# Patient Record
Sex: Female | Born: 1990 | Hispanic: No | Marital: Single | Smoking: Never smoker
Health system: Southern US, Community
[De-identification: ages and names within clinical notes are randomized; demographics above are authoritative.]

## PROBLEM LIST (undated history)

## (undated) ENCOUNTER — Inpatient Hospital Stay: Payer: Self-pay

## (undated) DIAGNOSIS — I10 Essential (primary) hypertension: Secondary | ICD-10-CM

## (undated) DIAGNOSIS — F410 Panic disorder [episodic paroxysmal anxiety] without agoraphobia: Secondary | ICD-10-CM

## (undated) DIAGNOSIS — J45909 Unspecified asthma, uncomplicated: Secondary | ICD-10-CM

## (undated) DIAGNOSIS — F419 Anxiety disorder, unspecified: Secondary | ICD-10-CM

## (undated) HISTORY — PX: NO PAST SURGERIES: SHX2092

---
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
## 2008-01-19 ENCOUNTER — Observation Stay: Payer: Self-pay

## 2008-01-24 ENCOUNTER — Observation Stay: Payer: Self-pay | Admitting: Unknown Physician Specialty

## 2008-02-12 ENCOUNTER — Observation Stay: Payer: Self-pay | Admitting: Obstetrics & Gynecology

## 2008-03-03 ENCOUNTER — Observation Stay: Payer: Self-pay

## 2008-03-07 ENCOUNTER — Inpatient Hospital Stay: Payer: Self-pay

## 2009-05-27 ENCOUNTER — Emergency Department: Payer: Self-pay | Admitting: Emergency Medicine

## 2010-05-18 ENCOUNTER — Emergency Department: Payer: Self-pay | Admitting: Emergency Medicine

## 2010-10-15 LAB — URINALYSIS, ROUTINE W REFLEX MICROSCOPIC
Bilirubin Urine: NEGATIVE
Glucose, UA: NEGATIVE
Hgb urine dipstick: NEGATIVE
Ketones, ur: NEGATIVE
Nitrite: NEGATIVE
Protein, ur: NEGATIVE
Specific Gravity, Urine: 1.021
Urobilinogen, UA: 0.2
pH: 7

## 2010-10-15 LAB — WET PREP, GENITAL
Trich, Wet Prep: NONE SEEN
Yeast Wet Prep HPF POC: NONE SEEN

## 2010-10-15 LAB — COMPREHENSIVE METABOLIC PANEL
ALT: 13
Albumin: 2.5 — ABNORMAL LOW
BUN: 3 — ABNORMAL LOW
CO2: 25
Calcium: 7.5 — ABNORMAL LOW
Chloride: 106
Creatinine, Ser: 0.55
GFR calc Af Amer: >60
GFR calc non Af Amer: >60
Glucose, Bld: 126 — ABNORMAL HIGH
Potassium: 2.8 — ABNORMAL LOW
Total Bilirubin: 0.2 — ABNORMAL LOW
Total Protein: 5.5 — ABNORMAL LOW

## 2010-10-15 LAB — BASIC METABOLIC PANEL
BUN: 2 — ABNORMAL LOW
Calcium: 8.2 — ABNORMAL LOW
Creatinine, Ser: 0.58
GFR calc Af Amer: >60

## 2010-10-15 LAB — LACTATE DEHYDROGENASE: LDH: 93 — ABNORMAL LOW

## 2010-10-15 LAB — CBC
Hemoglobin: 10.3 — ABNORMAL LOW
MCHC: 34
MCV: 91.7
Platelets: 233
RBC: 3.29 — ABNORMAL LOW
RDW: 14.7
WBC: 15.9 — ABNORMAL HIGH

## 2010-10-15 LAB — URINE MICROSCOPIC-ADD ON

## 2010-10-15 LAB — CREATININE, SERUM
Creatinine, Ser: 0.49
GFR calc Af Amer: >60
GFR calc non Af Amer: >60

## 2010-10-15 LAB — URIC ACID: Uric Acid, Serum: 3.6

## 2011-07-06 ENCOUNTER — Emergency Department: Payer: Self-pay | Admitting: *Deleted

## 2011-07-06 LAB — COMPREHENSIVE METABOLIC PANEL
BUN: 12 mg/dL (ref 7–18)
Bilirubin,Total: 0.5 mg/dL (ref 0.2–1.0)
Calcium, Total: 9.4 mg/dL (ref 8.5–10.1)
Co2: 28 mmol/L (ref 21–32)
EGFR (Non-African Amer.): >60
Osmolality: 275 (ref 275–301)
SGOT(AST): 19 U/L (ref 15–37)
SGPT (ALT): 20 U/L
Sodium: 138 mmol/L (ref 136–145)

## 2011-07-06 LAB — CBC
HCT: 42.5 % (ref 35.0–47.0)
HGB: 13.9 g/dL (ref 12.0–16.0)
MCH: 29.1 pg (ref 26.0–34.0)
RDW: 13.6 % (ref 11.5–14.5)
WBC: 10.1 10*3/uL (ref 3.6–11.0)

## 2011-07-06 LAB — URINALYSIS, COMPLETE
Bilirubin,UR: NEGATIVE
Ketone: NEGATIVE
Nitrite: NEGATIVE
RBC,UR: 1 /HPF (ref 0–5)
Specific Gravity: 1.021 (ref 1.003–1.030)
Squamous Epithelial: 16
WBC UR: 6 /HPF (ref 0–5)

## 2011-10-29 ENCOUNTER — Emergency Department: Payer: Self-pay | Admitting: Emergency Medicine

## 2011-11-01 LAB — BETA STREP CULTURE(ARMC)

## 2011-12-26 ENCOUNTER — Emergency Department (HOSPITAL_COMMUNITY): Payer: No Typology Code available for payment source

## 2011-12-26 ENCOUNTER — Encounter (HOSPITAL_COMMUNITY): Payer: Self-pay | Admitting: *Deleted

## 2011-12-26 ENCOUNTER — Emergency Department (HOSPITAL_COMMUNITY)
Admission: EM | Admit: 2011-12-26 | Discharge: 2011-12-26 | Disposition: A | Discharge status: Home / Self Care | Payer: No Typology Code available for payment source | Attending: Emergency Medicine | Admitting: Emergency Medicine

## 2011-12-26 ENCOUNTER — Emergency Department: Payer: Self-pay | Admitting: Emergency Medicine

## 2011-12-26 DIAGNOSIS — S139XXA Sprain of joints and ligaments of unspecified parts of neck, initial encounter: Secondary | ICD-10-CM | POA: Insufficient documentation

## 2011-12-26 DIAGNOSIS — Y92410 Unspecified street and highway as the place of occurrence of the external cause: Secondary | ICD-10-CM | POA: Insufficient documentation

## 2011-12-26 DIAGNOSIS — S161XXA Strain of muscle, fascia and tendon at neck level, initial encounter: Secondary | ICD-10-CM

## 2011-12-26 DIAGNOSIS — S8000XA Contusion of unspecified knee, initial encounter: Secondary | ICD-10-CM | POA: Insufficient documentation

## 2011-12-26 DIAGNOSIS — S20219A Contusion of unspecified front wall of thorax, initial encounter: Secondary | ICD-10-CM | POA: Insufficient documentation

## 2011-12-26 DIAGNOSIS — S8002XA Contusion of left knee, initial encounter: Secondary | ICD-10-CM

## 2011-12-26 DIAGNOSIS — Y9389 Activity, other specified: Secondary | ICD-10-CM | POA: Insufficient documentation

## 2011-12-26 MED ORDER — IOHEXOL 300 MG/ML  SOLN
100.0000 mL | Freq: Once | INTRAMUSCULAR | Status: AC | PRN
  Administered 2011-12-26: 100 mL via INTRAVENOUS
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
## 2011-12-26 NOTE — Progress Notes (Signed)
Orthopedic Tech Progress Note Patient Details:  Julia Cherry 1990-01-18 [id number]  Ortho Devices Type of Ortho Device: Crutches;Knee Sleeve Ortho Device/Splint Location: left leg Ortho Device/Splint Interventions: Application   [name] 12/26/2011, 10:18 PM

## 2011-12-26 NOTE — ED Provider Notes (Signed)
Medical screening examination/treatment/procedure(s) were performed by non-physician practitioner and as supervising physician I was immediately available for consultation/collaboration.   Charles B. Sheldon, MD 12/26/11 2224 
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
## 2012-03-31 ENCOUNTER — Emergency Department: Payer: Self-pay | Admitting: Emergency Medicine

## 2012-03-31 LAB — URINALYSIS, COMPLETE
Bacteria: NONE SEEN
Bilirubin,UR: NEGATIVE
Blood: NEGATIVE
Glucose,UR: NEGATIVE mg/dL (ref 0–75)
Ketone: NEGATIVE
Nitrite: NEGATIVE
Specific Gravity: 1.014 (ref 1.003–1.030)
WBC UR: 1 /HPF (ref 0–5)

## 2012-03-31 LAB — COMPREHENSIVE METABOLIC PANEL
Albumin: 3.5 g/dL (ref 3.4–5.0)
Anion Gap: 6 — ABNORMAL LOW (ref 7–16)
BUN: 11 mg/dL (ref 7–18)
Bilirubin,Total: 0.2 mg/dL (ref 0.2–1.0)
Chloride: 105 mmol/L (ref 98–107)
Co2: 26 mmol/L (ref 21–32)
Potassium: 3.5 mmol/L (ref 3.5–5.1)
SGOT(AST): 20 U/L (ref 15–37)
Total Protein: 8.2 g/dL (ref 6.4–8.2)

## 2012-03-31 LAB — CBC
HCT: 41.6 % (ref 35.0–47.0)
MCHC: 33.3 g/dL (ref 32.0–36.0)
MCV: 87 fL (ref 80–100)
RDW: 13.3 % (ref 11.5–14.5)
WBC: 12.4 10*3/uL — ABNORMAL HIGH (ref 3.6–11.0)

## 2012-07-14 ENCOUNTER — Emergency Department: Payer: Self-pay | Admitting: Emergency Medicine

## 2012-08-11 ENCOUNTER — Emergency Department: Payer: Self-pay | Admitting: Emergency Medicine

## 2012-08-26 ENCOUNTER — Emergency Department: Payer: Self-pay | Admitting: Emergency Medicine

## 2012-09-22 ENCOUNTER — Emergency Department: Payer: Self-pay

## 2012-09-22 LAB — URINALYSIS, COMPLETE
Bacteria: NONE SEEN
Bilirubin,UR: NEGATIVE
Blood: NEGATIVE
Glucose,UR: NEGATIVE mg/dL (ref 0–75)
Ketone: NEGATIVE
Leukocyte Esterase: NEGATIVE
Nitrite: NEGATIVE
Ph: 8 (ref 4.5–8.0)
Protein: NEGATIVE

## 2012-10-08 ENCOUNTER — Emergency Department: Payer: Self-pay | Admitting: Emergency Medicine

## 2012-10-08 LAB — URINALYSIS, COMPLETE
Bacteria: NONE SEEN
Ketone: NEGATIVE
Leukocyte Esterase: NEGATIVE
Ph: 5 (ref 4.5–8.0)
Protein: NEGATIVE
RBC,UR: 2 /HPF (ref 0–5)
Squamous Epithelial: 9
WBC UR: 4 /HPF (ref 0–5)

## 2012-10-08 LAB — COMPREHENSIVE METABOLIC PANEL
Albumin: 3.5 g/dL (ref 3.4–5.0)
Anion Gap: 5 — ABNORMAL LOW (ref 7–16)
Bilirubin,Total: 0.4 mg/dL (ref 0.2–1.0)
Calcium, Total: 9.2 mg/dL (ref 8.5–10.1)
Creatinine: 0.84 mg/dL (ref 0.60–1.30)
EGFR (African American): >60
EGFR (Non-African Amer.): >60
Osmolality: 270 (ref 275–301)
Potassium: 3.6 mmol/L (ref 3.5–5.1)
Total Protein: 8.2 g/dL (ref 6.4–8.2)

## 2012-10-08 LAB — CBC
Platelet: 266 10*3/uL (ref 150–440)
RBC: 4.74 10*6/uL (ref 3.80–5.20)
WBC: 11.2 10*3/uL — ABNORMAL HIGH (ref 3.6–11.0)

## 2012-10-08 LAB — PREGNANCY, URINE: Pregnancy Test, Urine: NEGATIVE m[IU]/mL

## 2012-12-04 ENCOUNTER — Emergency Department: Payer: Self-pay | Admitting: Emergency Medicine

## 2013-01-31 ENCOUNTER — Emergency Department: Payer: Self-pay | Admitting: Emergency Medicine

## 2013-01-31 LAB — RAPID INFLUENZA A&B ANTIGENS (ARMC ONLY)

## 2013-05-04 ENCOUNTER — Emergency Department: Payer: Self-pay | Admitting: Emergency Medicine

## 2013-05-04 LAB — COMPREHENSIVE METABOLIC PANEL
ALK PHOS: 66 U/L
ALT: 18 U/L (ref 12–78)
AST: 21 U/L (ref 15–37)
Albumin: 3.2 g/dL — ABNORMAL LOW (ref 3.4–5.0)
Anion Gap: 3 — ABNORMAL LOW (ref 7–16)
BILIRUBIN TOTAL: 0.2 mg/dL (ref 0.2–1.0)
BUN: 11 mg/dL (ref 7–18)
Calcium, Total: 8.6 mg/dL (ref 8.5–10.1)
Chloride: 107 mmol/L (ref 98–107)
Co2: 30 mmol/L (ref 21–32)
Creatinine: 0.79 mg/dL (ref 0.60–1.30)
EGFR (African American): >60
Glucose: 117 mg/dL — ABNORMAL HIGH (ref 65–99)
OSMOLALITY: 280 (ref 275–301)
Potassium: 3.6 mmol/L (ref 3.5–5.1)
SODIUM: 140 mmol/L (ref 136–145)
TOTAL PROTEIN: 7.5 g/dL (ref 6.4–8.2)

## 2013-05-04 LAB — URINALYSIS, COMPLETE
Bacteria: NONE SEEN
Bilirubin,UR: NEGATIVE
GLUCOSE, UR: NEGATIVE mg/dL (ref 0–75)
Ketone: NEGATIVE
Leukocyte Esterase: NEGATIVE
NITRITE: NEGATIVE
PH: 5 (ref 4.5–8.0)
Protein: 30
RBC,UR: 197 /HPF (ref 0–5)
Specific Gravity: 1.024 (ref 1.003–1.030)
Squamous Epithelial: 2

## 2013-05-04 LAB — CBC WITH DIFFERENTIAL/PLATELET
BASOS PCT: 1.1 %
Basophil #: 0.1 10*3/uL (ref 0.0–0.1)
EOS ABS: 0.3 10*3/uL (ref 0.0–0.7)
Eosinophil %: 3.5 %
HCT: 38.5 % (ref 35.0–47.0)
HGB: 13.1 g/dL (ref 12.0–16.0)
Lymphocyte #: 2.1 10*3/uL (ref 1.0–3.6)
Lymphocyte %: 24.4 %
MCH: 29.7 pg (ref 26.0–34.0)
MCHC: 33.9 g/dL (ref 32.0–36.0)
MCV: 88 fL (ref 80–100)
MONOS PCT: 6.8 %
Monocyte #: 0.6 x10 3/mm (ref 0.2–0.9)
Neutrophil #: 5.6 10*3/uL (ref 1.4–6.5)
Neutrophil %: 64.2 %
Platelet: 253 10*3/uL (ref 150–440)
RBC: 4.4 10*6/uL (ref 3.80–5.20)
RDW: 14 % (ref 11.5–14.5)
WBC: 8.8 10*3/uL (ref 3.6–11.0)

## 2013-05-04 LAB — LIPASE, BLOOD: Lipase: 86 U/L (ref 73–393)

## 2013-08-22 ENCOUNTER — Ambulatory Visit: Payer: Self-pay | Admitting: Family Medicine

## 2013-08-22 LAB — URINALYSIS, COMPLETE
BACTERIA: NEGATIVE
BILIRUBIN, UR: NEGATIVE
BLOOD: NEGATIVE
Glucose,UR: NEGATIVE mg/dL (ref 0–75)
Ketone: NEGATIVE
Leukocyte Esterase: NEGATIVE
Nitrite: NEGATIVE
PROTEIN: NEGATIVE
Ph: 7 (ref 4.5–8.0)
RBC, UR: NONE SEEN /HPF (ref 0–5)
Specific Gravity: 1.015 (ref 1.003–1.030)

## 2014-04-21 ENCOUNTER — Emergency Department
Admit: 2014-04-21 | Disposition: A | Discharge status: Home / Self Care | Payer: Self-pay | Admitting: Emergency Medicine

## 2014-04-21 LAB — COMPREHENSIVE METABOLIC PANEL
ALBUMIN: 3.6 g/dL
AST: 17 U/L
Alkaline Phosphatase: 48 U/L
Anion Gap: 5 — ABNORMAL LOW (ref 7–16)
BUN: 14 mg/dL
Bilirubin,Total: 0.8 mg/dL
Calcium, Total: 8.9 mg/dL
Chloride: 106 mmol/L
Co2: 26 mmol/L
Creatinine: 0.71 mg/dL
Glucose: 102 mg/dL — ABNORMAL HIGH
Potassium: 3.4 mmol/L — ABNORMAL LOW
SGPT (ALT): 14 U/L
Sodium: 137 mmol/L
TOTAL PROTEIN: 7.1 g/dL

## 2014-04-21 LAB — CBC
HCT: 37.9 % (ref 35.0–47.0)
HGB: 12.6 g/dL (ref 12.0–16.0)
MCH: 29 pg (ref 26.0–34.0)
MCHC: 33.3 g/dL (ref 32.0–36.0)
MCV: 87 fL (ref 80–100)
PLATELETS: 225 10*3/uL (ref 150–440)
RBC: 4.36 10*6/uL (ref 3.80–5.20)
RDW: 13.4 % (ref 11.5–14.5)
WBC: 10.8 10*3/uL (ref 3.6–11.0)

## 2014-04-21 LAB — URINALYSIS, COMPLETE
BLOOD: NEGATIVE
Bacteria: NONE SEEN
Bilirubin,UR: NEGATIVE
Glucose,UR: NEGATIVE mg/dL (ref 0–75)
KETONE: NEGATIVE
Leukocyte Esterase: NEGATIVE
Nitrite: NEGATIVE
Ph: 8 (ref 4.5–8.0)
Protein: NEGATIVE
SPECIFIC GRAVITY: 1.018 (ref 1.003–1.030)

## 2014-04-21 LAB — LIPASE, BLOOD: Lipase: 46 U/L

## 2014-05-08 ENCOUNTER — Emergency Department
Admit: 2014-05-08 | Disposition: A | Discharge status: Home / Self Care | Payer: Self-pay | Admitting: Emergency Medicine

## 2014-10-24 ENCOUNTER — Encounter: Payer: Self-pay | Admitting: Emergency Medicine

## 2014-10-24 ENCOUNTER — Emergency Department: Payer: Medicaid Other

## 2014-10-24 ENCOUNTER — Emergency Department
Admission: EM | Admit: 2014-10-24 | Discharge: 2014-10-24 | Disposition: A | Discharge status: Home / Self Care | Payer: Medicaid Other | Attending: Emergency Medicine | Admitting: Emergency Medicine

## 2014-10-24 DIAGNOSIS — Z3A01 Less than 8 weeks gestation of pregnancy: Secondary | ICD-10-CM | POA: Insufficient documentation

## 2014-10-24 DIAGNOSIS — R51 Headache: Secondary | ICD-10-CM | POA: Insufficient documentation

## 2014-10-24 DIAGNOSIS — R109 Unspecified abdominal pain: Secondary | ICD-10-CM | POA: Insufficient documentation

## 2014-10-24 DIAGNOSIS — O9989 Other specified diseases and conditions complicating pregnancy, childbirth and the puerperium: Secondary | ICD-10-CM | POA: Diagnosis present

## 2014-10-24 DIAGNOSIS — Z3490 Encounter for supervision of normal pregnancy, unspecified, unspecified trimester: Secondary | ICD-10-CM

## 2014-10-24 HISTORY — DX: Unspecified asthma, uncomplicated: J45.909

## 2014-10-24 LAB — COMPREHENSIVE METABOLIC PANEL
ALK PHOS: 57 U/L (ref 38–126)
ALT: 15 U/L (ref 14–54)
AST: 16 U/L (ref 15–41)
Albumin: 3.6 g/dL (ref 3.5–5.0)
Anion gap: 8 (ref 5–15)
BUN: 15 mg/dL (ref 6–20)
CALCIUM: 9 mg/dL (ref 8.9–10.3)
CO2: 24 mmol/L (ref 22–32)
Chloride: 105 mmol/L (ref 101–111)
Creatinine, Ser: 0.71 mg/dL (ref 0.44–1.00)
GFR calc non Af Amer: >60 mL/min (ref >60)
Glucose, Bld: 110 mg/dL — ABNORMAL HIGH (ref 65–99)
Potassium: 3.6 mmol/L (ref 3.5–5.1)
Sodium: 137 mmol/L (ref 135–145)
TOTAL PROTEIN: 7.2 g/dL (ref 6.5–8.1)

## 2014-10-24 LAB — CBC WITH DIFFERENTIAL/PLATELET
Basophils Absolute: 0.1 10*3/uL (ref 0–0.1)
Basophils Relative: 1 %
Eosinophils Absolute: 0.5 10*3/uL (ref 0–0.7)
Eosinophils Relative: 4 %
HEMATOCRIT: 38.4 % (ref 35.0–47.0)
Hemoglobin: 12.8 g/dL (ref 12.0–16.0)
LYMPHS PCT: 22 %
Lymphs Abs: 2.4 10*3/uL (ref 1.0–3.6)
MCH: 28.7 pg (ref 26.0–34.0)
MCHC: 33.4 g/dL (ref 32.0–36.0)
MCV: 86 fL (ref 80.0–100.0)
MONOS PCT: 8 %
Monocytes Absolute: 0.9 10*3/uL (ref 0.2–0.9)
Neutro Abs: 7.1 10*3/uL — ABNORMAL HIGH (ref 1.4–6.5)
Neutrophils Relative %: 65 %
Platelets: 260 10*3/uL (ref 150–440)
RBC: 4.47 MIL/uL (ref 3.80–5.20)
RDW: 13.6 % (ref 11.5–14.5)
WBC: 10.9 10*3/uL (ref 3.6–11.0)

## 2014-10-24 LAB — URINALYSIS COMPLETE WITH MICROSCOPIC (ARMC ONLY)
Bilirubin Urine: NEGATIVE
Glucose, UA: NEGATIVE mg/dL
Hgb urine dipstick: NEGATIVE
KETONES UR: NEGATIVE mg/dL
Nitrite: NEGATIVE
Protein, ur: NEGATIVE mg/dL
Specific Gravity, Urine: 1.027 (ref 1.005–1.030)
pH: 6 (ref 5.0–8.0)

## 2014-10-24 LAB — HCG, QUANTITATIVE, PREGNANCY: hCG, Beta Chain, Quant, S: 4858 m[IU]/mL — ABNORMAL HIGH (ref <5)

## 2014-10-24 MED ORDER — ACETAMINOPHEN 500 MG PO TABS
1000.0000 mg | ORAL_TABLET | Freq: Once | ORAL | Status: AC
  Administered 2014-10-24: 1000 mg via ORAL

## 2014-10-24 MED ORDER — ACETAMINOPHEN 500 MG PO TABS
ORAL_TABLET | ORAL | Status: AC
  Administered 2014-10-24: 1000 mg via ORAL
  Filled 2014-10-24: qty 2

## 2014-10-24 NOTE — ED Notes (Signed)
C/o lower abd. Cramping and headache x 4-5 days, states she is [redacted] weeks pregnant, denies any vaginal bleeding

## 2014-10-24 NOTE — Discharge Instructions (Signed)
First Trimester of Pregnancy The first trimester of pregnancy is from week 1 until the end of week 12 (months 1 through 3). A week after a sperm fertilizes an egg, the egg will implant on the wall of the uterus. This embryo will begin to develop into a baby. Genes from you and your partner are forming the baby. The female genes determine whether the baby is a boy or a girl. At 6-8 weeks, the eyes and face are formed, and the heartbeat can be seen on ultrasound. At the end of 12 weeks, all the baby's organs are formed.  Now that you are pregnant, you will want to do everything you can to have a healthy baby. Two of the most important things are to get good prenatal care and to follow your health care provider's instructions. Prenatal care is all the medical care you receive before the baby's birth. This care will help prevent, find, and treat any problems during the pregnancy and childbirth. BODY CHANGES Your body goes through many changes during pregnancy. The changes vary from woman to woman.   You may gain or lose a couple of pounds at first.  You may feel sick to your stomach (nauseous) and throw up (vomit). If the vomiting is uncontrollable, call your health care provider.  You may tire easily.  You may develop headaches that can be relieved by medicines approved by your health care provider.  You may urinate more often. Painful urination may mean you have a bladder infection.  You may develop heartburn as a result of your pregnancy.  You may develop constipation because certain hormones are causing the muscles that push waste through your intestines to slow down.  You may develop hemorrhoids or swollen, bulging veins (varicose veins).  Your breasts may begin to grow larger and become tender. Your nipples may stick out more, and the tissue that surrounds them (areola) may become darker.  Your gums may bleed and may be sensitive to brushing and flossing.  Dark spots or blotches (chloasma,  mask of pregnancy) may develop on your face. This will likely fade after the baby is born.  Your menstrual periods will stop.  You may have a loss of appetite.  You may develop cravings for certain kinds of food.  You may have changes in your emotions from day to day, such as being excited to be pregnant or being concerned that something may go wrong with the pregnancy and baby.  You may have more vivid and strange dreams.  You may have changes in your hair. These can include thickening of your hair, rapid growth, and changes in texture. Some women also have hair loss during or after pregnancy, or hair that feels dry or thin. Your hair will most likely return to normal after your baby is born. WHAT TO EXPECT AT YOUR PRENATAL VISITS During a routine prenatal visit:  You will be weighed to make sure you and the baby are growing normally.  Your blood pressure will be taken.  Your abdomen will be measured to track your baby's growth.  The fetal heartbeat will be listened to starting around week 10 or 12 of your pregnancy.  Test results from any previous visits will be discussed. Your health care provider may ask you:  How you are feeling.  If you are feeling the baby move.  If you have had any abnormal symptoms, such as leaking fluid, bleeding, severe headaches, or abdominal cramping.  If you are using any tobacco products,   including cigarettes, chewing tobacco, and electronic cigarettes.  If you have any questions. Other tests that may be performed during your first trimester include:  Blood tests to find your blood type and to check for the presence of any previous infections. They will also be used to check for low iron levels (anemia) and Rh antibodies. Later in the pregnancy, blood tests for diabetes will be done along with other tests if problems develop.  Urine tests to check for infections, diabetes, or protein in the urine.  An ultrasound to confirm the proper growth  and development of the baby.  An amniocentesis to check for possible genetic problems.  Fetal screens for spina bifida and Down syndrome.  You may need other tests to make sure you and the baby are doing well.  HIV (human immunodeficiency virus) testing. Routine prenatal testing includes screening for HIV, unless you choose not to have this test. HOME CARE INSTRUCTIONS  Medicines  Follow your health care provider's instructions regarding medicine use. Specific medicines may be either safe or unsafe to take during pregnancy.  Take your prenatal vitamins as directed.  If you develop constipation, try taking a stool softener if your health care provider approves. Diet  Eat regular, well-balanced meals. Choose a variety of foods, such as meat or vegetable-based protein, fish, milk and low-fat dairy products, vegetables, fruits, and whole grain breads and cereals. Your health care provider will help you determine the amount of weight gain that is right for you.  Avoid raw meat and uncooked cheese. These carry germs that can cause birth defects in the baby.  Eating four or five small meals rather than three large meals a day may help relieve nausea and vomiting. If you start to feel nauseous, eating a few soda crackers can be helpful. Drinking liquids between meals instead of during meals also seems to help nausea and vomiting.  If you develop constipation, eat more high-fiber foods, such as fresh vegetables or fruit and whole grains. Drink enough fluids to keep your urine clear or pale yellow. Activity and Exercise  Exercise only as directed by your health care provider. Exercising will help you:  Control your weight.  Stay in shape.  Be prepared for labor and delivery.  Experiencing pain or cramping in the lower abdomen or low back is a good sign that you should stop exercising. Check with your health care provider before continuing normal exercises.  Try to avoid standing for long  periods of time. Move your legs often if you must stand in one place for a long time.  Avoid heavy lifting.  Wear low-heeled shoes, and practice good posture.  You may continue to have sex unless your health care provider directs you otherwise. Relief of Pain or Discomfort  Wear a good support bra for breast tenderness.   Take warm sitz baths to soothe any pain or discomfort caused by hemorrhoids. Use hemorrhoid cream if your health care provider approves.   Rest with your legs elevated if you have leg cramps or low back pain.  If you develop varicose veins in your legs, wear support hose. Elevate your feet for 15 minutes, 3-4 times a day. Limit salt in your diet. Prenatal Care  Schedule your prenatal visits by the twelfth week of pregnancy. They are usually scheduled monthly at first, then more often in the last 2 months before delivery.  Write down your questions. Take them to your prenatal visits.  Keep all your prenatal visits as directed by your   health care provider. Safety  Wear your seat belt at all times when driving.  Make a list of emergency phone numbers, including numbers for family, friends, the hospital, and police and fire departments. General Tips  Ask your health care provider for a referral to a local prenatal education class. Begin classes no later than at the beginning of month 6 of your pregnancy.  Ask for help if you have counseling or nutritional needs during pregnancy. Your health care provider can offer advice or refer you to specialists for help with various needs.  Do not use hot tubs, steam rooms, or saunas.  Do not douche or use tampons or scented sanitary pads.  Do not cross your legs for long periods of time.  Avoid cat litter boxes and soil used by cats. These carry germs that can cause birth defects in the baby and possibly loss of the fetus by miscarriage or stillbirth.  Avoid all smoking, herbs, alcohol, and medicines not prescribed by  your health care provider. Chemicals in these affect the formation and growth of the baby.  Do not use any tobacco products, including cigarettes, chewing tobacco, and electronic cigarettes. If you need help quitting, ask your health care provider. You may receive counseling support and other resources to help you quit.  Schedule a dentist appointment. At home, brush your teeth with a soft toothbrush and be gentle when you floss. SEEK MEDICAL CARE IF:   You have dizziness.  You have mild pelvic cramps, pelvic pressure, or nagging pain in the abdominal area.  You have persistent nausea, vomiting, or diarrhea.  You have a bad smelling vaginal discharge.  You have pain with urination.  You notice increased swelling in your face, hands, legs, or ankles. SEEK IMMEDIATE MEDICAL CARE IF:   You have a fever.  You are leaking fluid from your vagina.  You have spotting or bleeding from your vagina.  You have severe abdominal cramping or pain.  You have rapid weight gain or loss.  You vomit blood or material that looks like coffee grounds.  You are exposed to German measles and have never had them.  You are exposed to fifth disease or chickenpox.  You develop a severe headache.  You have shortness of breath.  You have any kind of trauma, such as from a fall or a car accident.   This information is not intended to replace advice given to you by your health care provider. Make sure you discuss any questions you have with your health care provider.   Document Released: 12/24/2000 Document Revised: 01/20/2014 Document Reviewed: 11/09/2012 [employer] Interactive Patient Education 2016 [employer]  

## 2014-10-24 NOTE — ED Provider Notes (Signed)
[facility] Emergency Department Provider Note  ____________________________________________  Time seen: 10 AM  I have reviewed the triage vital signs and the nursing notes.   HISTORY  Chief Complaint Abdominal Pain and Headache    HPI Julia Cherry is a 24 y.o. female who presents with lower abdominal mild cramping sensation. She reports she is about [redacted] weeks pregnant and reports this pain has become consistent over the last 3 days. She denies vaginal bleeding. No fevers no chills. No dysuria. No nausea no vomiting. She is never had this pain before. She was last pregnant 7 years ago     Past Medical History  Diagnosis Date  . Asthma     There are no active problems to display for this patient.   History reviewed. No pertinent past surgical history.  Current Outpatient Rx  Name  Route  Sig  Dispense  Refill  . albuterol (PROVENTIL HFA;VENTOLIN HFA) 108 (90 BASE) MCG/ACT inhaler   Inhalation   Inhale 2 puffs into the lungs every 6 (six) hours as needed for wheezing or shortness of breath.         . benzonatate (TESSALON) 100 MG capsule   Oral   Take 100 mg by mouth 3 (three) times daily as needed for cough.          . budesonide-formoterol (SYMBICORT) 160-4.5 MCG/ACT inhaler   Inhalation   Inhale 2 puffs into the lungs 2 (two) times daily as needed (shortness of breath, wheezing).           Allergies Review of patient's allergies indicates no known allergies.  No family history on file.  Social History Social History  Substance Use Topics  . Smoking status: Never Smoker   . Smokeless tobacco: None  . Alcohol Use: No    Review of Systems  Constitutional: Negative for fever. Eyes: Negative for visual changes. ENT: Negative for sore throat Cardiovascular: Negative for chest pain. Respiratory: Negative for shortness of breath. Gastrointestinal: Crampy abdominal pain Genitourinary: Negative for  dysuria. Musculoskeletal: Negative for back pain. Skin: Negative for rash. Neurological: Negative for headaches or focal weakness Psychiatric: No anxiety    ____________________________________________   PHYSICAL EXAM:  VITAL SIGNS: ED Triage Vitals  Enc Vitals Group     BP 10/24/14 0838 124/76 mmHg     Pulse Rate 10/24/14 0838 86     Resp 10/24/14 0838 18     Temp 10/24/14 0838 98.4 F (36.9 C)     Temp Source 10/24/14 0838 Oral     SpO2 10/24/14 0838 98 %     Weight 10/24/14 0838 160 lb (72.576 kg)     Height 10/24/14 0838  (1.575 m)     Head Cir --      Peak Flow --      Pain Score 10/24/14 0839 8     Pain Loc --      Pain Edu? --      Excl. in GC? --      Constitutional: Alert and oriented. Well appearing and in no distress. Eyes: Conjunctivae are normal.  ENT   Head: Normocephalic and atraumatic.   Mouth/Throat: Mucous membranes are moist. Cardiovascular: Normal rate, regular rhythm. Normal and symmetric distal pulses are present in all extremities. No murmurs, rubs, or gallops. Respiratory: Normal respiratory effort without tachypnea nor retractions. Breath sounds are clear and equal bilaterally.  Gastrointestinal: Soft and non-tender in all quadrants. No distention. There is no CVA tenderness. Genitourinary: deferred Musculoskeletal: Nontender  with normal range of motion in all extremities. No lower extremity tenderness nor edema. Neurologic:  Normal speech and language. No gross focal neurologic deficits are appreciated. Skin:  Skin is warm, dry and intact. No rash noted. Psychiatric: Mood and affect are normal. Patient exhibits appropriate insight and judgment.  ____________________________________________    LABS (pertinent positives/negatives)  Labs Reviewed  CBC WITH DIFFERENTIAL/PLATELET - Abnormal; Notable for the following:    Neutro Abs 7.1 (*)    All other components within normal limits  COMPREHENSIVE METABOLIC PANEL - Abnormal;  Notable for the following:    Glucose, Bld 110 (*)    Total Bilirubin <0.1 (*)    All other components within normal limits  HCG, QUANTITATIVE, PREGNANCY - Abnormal; Notable for the following:    hCG, Beta Chain, Quant, S 4858 (*)    All other components within normal limits  URINALYSIS COMPLETEWITH MICROSCOPIC ([facility] ONLY) - Abnormal; Notable for the following:    Color, Urine YELLOW (*)    APPearance CLOUDY (*)    Leukocytes, UA 1+ (*)    Bacteria, UA RARE (*)    Squamous Epithelial / LPF TOO NUMEROUS TO COUNT (*)    All other components within normal limits    ____________________________________________   EKG  None  ____________________________________________    RADIOLOGY I have personally reviewed any xrays that were ordered on this patient: Ultrasound shows gestational sac  ____________________________________________   PROCEDURES  Procedure(s) performed: none  Critical Care performed: none  ____________________________________________   INITIAL IMPRESSION / ASSESSMENT AND PLAN / ED COURSE  Pertinent labs & imaging results that were available during my care of the patient were reviewed by me and considered in my medical decision making (see chart for details).  Patient overall well-appearing. Benign exam. We'll obtain ultrasound given that she is [redacted] weeks pregnant to verify IUP. Lab work is benign. Urine is likely contaminated  Ultrasound shows gestational sac I discussed with the patient the need to have a repeat ultrasound in 2 weeks. She will follow-up with GYN. She reports her abdominal pain is much better.  ____________________________________________   FINAL CLINICAL IMPRESSION(S) / ED DIAGNOSES  Final diagnoses:  Early stage of pregnancy     Jene Every, MD 10/24/14 1215

## 2014-11-22 ENCOUNTER — Emergency Department: Payer: Medicaid Other

## 2014-11-22 ENCOUNTER — Emergency Department
Admission: EM | Admit: 2014-11-22 | Discharge: 2014-11-23 | Disposition: A | Discharge status: Home / Self Care | Payer: Medicaid Other | Attending: Emergency Medicine | Admitting: Emergency Medicine

## 2014-11-22 DIAGNOSIS — Z3A09 9 weeks gestation of pregnancy: Secondary | ICD-10-CM | POA: Insufficient documentation

## 2014-11-22 DIAGNOSIS — O9989 Other specified diseases and conditions complicating pregnancy, childbirth and the puerperium: Secondary | ICD-10-CM | POA: Diagnosis present

## 2014-11-22 DIAGNOSIS — R109 Unspecified abdominal pain: Secondary | ICD-10-CM | POA: Insufficient documentation

## 2014-11-22 DIAGNOSIS — R102 Pelvic and perineal pain: Secondary | ICD-10-CM

## 2014-11-22 DIAGNOSIS — Z3491 Encounter for supervision of normal pregnancy, unspecified, first trimester: Secondary | ICD-10-CM

## 2014-11-22 LAB — CBC WITH DIFFERENTIAL/PLATELET
Basophils Absolute: 0.1 10*3/uL (ref 0–0.1)
Basophils Relative: 0 %
Eosinophils Absolute: 0.3 10*3/uL (ref 0–0.7)
Eosinophils Relative: 2 %
HEMATOCRIT: 35.9 % (ref 35.0–47.0)
Hemoglobin: 12.3 g/dL (ref 12.0–16.0)
LYMPHS PCT: 20 %
Lymphs Abs: 2.9 10*3/uL (ref 1.0–3.6)
MCH: 29.3 pg (ref 26.0–34.0)
MCHC: 34.3 g/dL (ref 32.0–36.0)
MCV: 85.4 fL (ref 80.0–100.0)
MONO ABS: 1.1 10*3/uL — AB (ref 0.2–0.9)
MONOS PCT: 8 %
Neutro Abs: 10.2 10*3/uL — ABNORMAL HIGH (ref 1.4–6.5)
Neutrophils Relative %: 70 %
PLATELETS: 269 10*3/uL (ref 150–440)
RBC: 4.2 MIL/uL (ref 3.80–5.20)
RDW: 13.5 % (ref 11.5–14.5)
WBC: 14.6 10*3/uL — ABNORMAL HIGH (ref 3.6–11.0)

## 2014-11-22 LAB — URINALYSIS COMPLETE WITH MICROSCOPIC (ARMC ONLY)
BILIRUBIN URINE: NEGATIVE
Bacteria, UA: NONE SEEN
Glucose, UA: NEGATIVE mg/dL
HGB URINE DIPSTICK: NEGATIVE
Ketones, ur: NEGATIVE mg/dL
Leukocytes, UA: NEGATIVE
Nitrite: NEGATIVE
PH: 5 (ref 5.0–8.0)
Protein, ur: NEGATIVE mg/dL
SPECIFIC GRAVITY, URINE: 1.028 (ref 1.005–1.030)

## 2014-11-22 LAB — TYPE AND SCREEN
ABO/RH(D): O POS
Antibody Screen: NEGATIVE

## 2014-11-22 LAB — COMPREHENSIVE METABOLIC PANEL
ALK PHOS: 47 U/L (ref 38–126)
ALT: 14 U/L (ref 14–54)
AST: 18 U/L (ref 15–41)
Albumin: 3.7 g/dL (ref 3.5–5.0)
Anion gap: 6 (ref 5–15)
BILIRUBIN TOTAL: 0.1 mg/dL — AB (ref 0.3–1.2)
BUN: 12 mg/dL (ref 6–20)
CALCIUM: 9.8 mg/dL (ref 8.9–10.3)
CHLORIDE: 106 mmol/L (ref 101–111)
CO2: 24 mmol/L (ref 22–32)
CREATININE: 0.64 mg/dL (ref 0.44–1.00)
Glucose, Bld: 88 mg/dL (ref 65–99)
Potassium: 3.4 mmol/L — ABNORMAL LOW (ref 3.5–5.1)
Sodium: 136 mmol/L (ref 135–145)
Total Protein: 7.6 g/dL (ref 6.5–8.1)

## 2014-11-22 LAB — LIPASE, BLOOD: Lipase: 22 U/L (ref 11–51)

## 2014-11-22 LAB — HCG, QUANTITATIVE, PREGNANCY: hCG, Beta Chain, Quant, S: 143120 m[IU]/mL — ABNORMAL HIGH (ref <5)

## 2014-11-22 NOTE — ED Notes (Signed)
PT IN WITH LOWER ABD CRAMPING AND HEADACHE.  STATES IS [redacted] WEEKS PREGNANT, DENIES ANY VAGINAL BLEEDING.

## 2014-11-22 NOTE — ED Provider Notes (Signed)
[facility] Emergency Department Provider Note  ____________________________________________  Time seen: Approximately 10:34 PM  I have reviewed the triage vital signs and the nursing notes.   HISTORY  Chief Complaint Abdominal Pain    HPI Julia Cherry is a 24 y.o. female who complains of crampy abdominal pain. Patient reports it started lower and now has worked its way up to the whole belly is from one side to the other. No fever vomiting or diarrhea. She had a normal bowel movement today. She usually has bowel movements once a day and she has been regular with her stools. Has seen Dr. Dionne Bucy side for her pregnancy she is about 8 weeks now. Patient has also seen Dr. Jean Rosenthal for headache she's been having taking Tylenol for that and another medicine which she can only take really pregnancy which makes her sleepy. She does not remember the name of it. Severity is mild to moderate. Nothing really seems to make it better or worse.   Past Medical History  Diagnosis Date  . Asthma     There are no active problems to display for this patient.   No past surgical history on file.  Current Outpatient Rx  Name  Route  Sig  Dispense  Refill  . albuterol (PROVENTIL HFA;VENTOLIN HFA) 108 (90 BASE) MCG/ACT inhaler   Inhalation   Inhale 2 puffs into the lungs every 6 (six) hours as needed for wheezing or shortness of breath.         . benzonatate (TESSALON) 100 MG capsule   Oral   Take 100 mg by mouth 3 (three) times daily as needed for cough.          . budesonide-formoterol (SYMBICORT) 160-4.5 MCG/ACT inhaler   Inhalation   Inhale 2 puffs into the lungs 2 (two) times daily as needed (shortness of breath, wheezing).           Allergies Review of patient's allergies indicates no known allergies.  No family history on file.  Social History Social History  Substance Use Topics  . Smoking status: Never Smoker   . Smokeless tobacco: Not  on file  . Alcohol Use: No    Review of Systems Constitutional: No fever/chills Eyes: No visual changes. ENT: No sore throat. Cardiovascular: Denies chest pain. Respiratory: Denies shortness of breath. Gastrointestinal:   No nausea, no vomiting.  No diarrhea.  No constipation. Genitourinary: Negative for dysuria. Musculoskeletal: Negative for back pain. Skin: Negative for rash. Neurological: Negative for focal weakness or numbness.  10-point ROS otherwise negative.  ____________________________________________   PHYSICAL EXAM:  VITAL SIGNS: ED Triage Vitals  Enc Vitals Group     BP 11/22/14 2023 134/70 mmHg     Pulse Rate 11/22/14 2023 91     Resp 11/22/14 2023 18     Temp 11/22/14 2023 98.6 F (37 C)     Temp Source 11/22/14 2023 Oral     SpO2 11/22/14 2023 97 %     Weight 11/22/14 2023 166 lb (75.297 kg)     Height 11/22/14 2023  (1.575 m)     Head Cir --      Peak Flow --      Pain Score 11/22/14 2023 9     Pain Loc --      Pain Edu? --      Excl. in GC? --     Constitutional: Alert and oriented. Well appearing and in no acute distress. Eyes: Conjunctivae are normal. PERRL.  EOMI. Head: Atraumatic. Nose: No congestion/rhinnorhea. Mouth/Throat: Mucous membranes are moist.  Oropharynx non-erythematous. Neck: No stridor.  Cardiovascular: Normal rate, regular rhythm. Grossly normal heart sounds.  Good peripheral circulation. Respiratory: Normal respiratory effort.  No retractions. Lungs CTAB. Gastrointestinal: Soft mildly tender to palpation throughout. There is no rebound tenderness or tenderness to percussion. Bowel sounds sound normal.. No distention. No abdominal bruits. No CVA tenderness. Musculoskeletal: No lower extremity tenderness nor edema.  No joint effusions. Neurologic:  Normal speech and language. No gross focal neurologic deficits are appreciated. No gait instability. Skin:  Skin is warm, dry and intact. No rash  noted.   ____________________________________________   LABS (all labs ordered are listed, but only abnormal results are displayed)  Labs Reviewed  CBC WITH DIFFERENTIAL/PLATELET - Abnormal; Notable for the following:    WBC 14.6 (*)    Neutro Abs 10.2 (*)    Monocytes Absolute 1.1 (*)    All other components within normal limits  COMPREHENSIVE METABOLIC PANEL - Abnormal; Notable for the following:    Potassium 3.4 (*)    Total Bilirubin 0.1 (*)    All other components within normal limits  URINALYSIS COMPLETEWITH MICROSCOPIC ([facility] ONLY) - Abnormal; Notable for the following:    Color, Urine YELLOW (*)    APPearance HAZY (*)    Squamous Epithelial / LPF 0-5 (*)    All other components within normal limits  HCG, QUANTITATIVE, PREGNANCY - Abnormal; Notable for the following:    hCG, Beta Chain, Quant, S 143120 (*)    All other components within normal limits  LIPASE, BLOOD  TYPE AND SCREEN  ABO/RH   ____________________________________________  EKG   ____________________________________________  RADIOLOGY   ____________________________________________   PROCEDURES    ____________________________________________   INITIAL IMPRESSION / ASSESSMENT AND PLAN / ED COURSE  Pertinent labs & imaging results that were available during my care of the patient were reviewed by me and considered in my medical decision making (see chart for details). Dr. Manson PasseyBrown will assume care of this patient  ____________________________________________   FINAL CLINICAL IMPRESSION(S) / ED DIAGNOSES  Final diagnoses:  Abdominal pain, unspecified abdominal location      Arnaldo NatalPaul F Marjan Rosman, MD 11/23/14 [id number]

## 2014-11-22 NOTE — ED Notes (Signed)
Dr. Malinda at bedside.  

## 2014-11-22 NOTE — ED Notes (Addendum)
Pt presents to ED with c/o lower abdominal pain and frontal headache since 1 pm today. Pt has hx of migraines, states she is 8 weeks and 6 days pregnant. Pt denies chest pain, shortness of breath, constipation, urinary problems, vomiting, diarrhea or fever at home. Pt tearful during assessment. Pt alert and oriented x 4, respirations even and unlabored, skin warm and dry, family at bedside. Call bed within reach, will continue to monitor.

## 2014-11-22 NOTE — ED Notes (Signed)
Informed lab of add-on of Lipase for patinet. Lab acknowledged understanding.

## 2014-11-23 LAB — ABO/RH: ABO/RH(D): O POS

## 2014-11-23 NOTE — ED Provider Notes (Signed)
I seem care of the patient 11:00 PM from Dr.Malinda. Pelvic ultrasound and general abdominal ultrasound revealed  US Abdomen Complete (Final result) Result time: 11/23/14 01:47:43   Final result by Rad Results In Interface (11/23/14 01:47:43)   Narrative:   CLINICAL DATA: Crampy abdominal pain. Elevated white count.  EXAM: ULTRASOUND ABDOMEN COMPLETE  COMPARISON: 04/01/2012 abdominal CT  FINDINGS: Delay between scan time and reporting due to PACS IT factors.  Gallbladder: No gallstones or wall thickening visualized. No sonographic Murphy sign noted.  Common bile duct: Diameter: 3 mm  Liver: No focal lesion identified. Within normal limits in parenchymal echogenicity. Antegrade flow in the imaged portal venous system.  IVC: No abnormality visualized.  Pancreas: Visualized portion unremarkable.  Spleen: Size and appearance within normal limits.  Right Kidney: Length: 10 cm. Echogenicity within normal limits. No mass or hydronephrosis visualized.  Left Kidney: Length: 11 cm. Echogenicity within normal limits. No mass or hydronephrosis visualized.  Abdominal aorta: No aneurysm visualized.  Other findings: Asymmetric hypoechoic appearance at the left basilar chest.  IMPRESSION: 1. Normal abdominal imaging. 2. Possible left pleural effusion.   Electronically Signed By: Marnee SpringJonathon Watts M.D. On: 11/23/2014 01:47          US OB Comp Less 14 Wks (Final result) Result time: 11/22/14 23:28:42   Final result by Rad Results In Interface (11/22/14 23:28:42)   Narrative:   CLINICAL DATA: Cramping for 1 day in first-trimester pregnancy  EXAM: OBSTETRIC <14 WK US AND TRANSVAGINAL OB US  TECHNIQUE: Both transabdominal and transvaginal ultrasound examinations were performed for complete evaluation of the gestation as well as the maternal uterus, adnexal regions, and pelvic cul-de-sac. Transvaginal technique was performed to assess early  pregnancy.  COMPARISON: 10/24/2014  FINDINGS: Intrauterine gestational sac: Visualized/normal in shape. No subchorionic fluid.  Yolk sac: Present  Embryo: Present  Cardiac Activity: Present  Heart Rate: 168 bpm  CRL: 25.4 mm  9 w  2 d         US EDC: 06/25/2015  Maternal uterus/adnexae: Normal appearance of the left ovary. Right ovary not visualized. No adnexal mass. Trace simple pelvic fluid.  IMPRESSION: 1. Single living intrauterine gestation measuring 9 weeks 2 days. No adverse findings. 2. Trace pelvic fluid.   Electronically Signed By: Marnee SpringJonathon Watts M.D. On: 11/22/2014 23:28          US OB Transvaginal (Final result) Result time: 11/22/14 23:28:42   Final result by Rad Results In Interface (11/22/14 23:28:42)   Narrative:   CLINICAL DATA: Cramping for 1 day in first-trimester pregnancy  EXAM: OBSTETRIC <14 WK US AND TRANSVAGINAL OB US  TECHNIQUE: Both transabdominal and transvaginal ultrasound examinations were performed for complete evaluation of the gestation as well as the maternal uterus, adnexal regions, and pelvic cul-de-sac. Transvaginal technique was performed to assess early pregnancy.  COMPARISON: 10/24/2014  FINDINGS: Intrauterine gestational sac: Visualized/normal in shape. No subchorionic fluid.  Yolk sac: Present  Embryo: Present  Cardiac Activity: Present  Heart Rate: 168 bpm  CRL: 25.4 mm  9 w  2 d         US EDC: 06/25/2015  Maternal uterus/adnexae: Normal appearance of the left ovary. Right ovary not visualized. No adnexal mass. Trace simple pelvic fluid.  IMPRESSION: 1. Single living intrauterine gestation measuring 9 weeks 2 days. No adverse findings. 2. Trace pelvic fluid.   Electronically Signed By: Marnee SpringJonathon Watts M.D. On: 11/22/2014 23:28      Patient notified of all clinical findings.  Darci Currentandolph N Brown, MD 11/23/14  0154 

## 2014-11-23 NOTE — Discharge Instructions (Signed)
First Trimester of Pregnancy The first trimester of pregnancy is from week 1 until the end of week 12 (months 1 through 3). A week after a sperm fertilizes an egg, the egg will implant on the wall of the uterus. This embryo will begin to develop into a baby. Genes from you and your partner are forming the baby. The female genes determine whether the baby is a boy or a girl. At 6-8 weeks, the eyes and face are formed, and the heartbeat can be seen on ultrasound. At the end of 12 weeks, all the baby's organs are formed.  Now that you are pregnant, you will want to do everything you can to have a healthy baby. Two of the most important things are to get good prenatal care and to follow your health care provider's instructions. Prenatal care is all the medical care you receive before the baby's birth. This care will help prevent, find, and treat any problems during the pregnancy and childbirth. BODY CHANGES Your body goes through many changes during pregnancy. The changes vary from woman to woman.   You may gain or lose a couple of pounds at first.  You may feel sick to your stomach (nauseous) and throw up (vomit). If the vomiting is uncontrollable, call your health care provider.  You may tire easily.  You may develop headaches that can be relieved by medicines approved by your health care provider.  You may urinate more often. Painful urination may mean you have a bladder infection.  You may develop heartburn as a result of your pregnancy.  You may develop constipation because certain hormones are causing the muscles that push waste through your intestines to slow down.  You may develop hemorrhoids or swollen, bulging veins (varicose veins).  Your breasts may begin to grow larger and become tender. Your nipples may stick out more, and the tissue that surrounds them (areola) may become darker.  Your gums may bleed and may be sensitive to brushing and flossing.  Dark spots or blotches (chloasma,  mask of pregnancy) may develop on your face. This will likely fade after the baby is born.  Your menstrual periods will stop.  You may have a loss of appetite.  You may develop cravings for certain kinds of food.  You may have changes in your emotions from day to day, such as being excited to be pregnant or being concerned that something may go wrong with the pregnancy and baby.  You may have more vivid and strange dreams.  You may have changes in your hair. These can include thickening of your hair, rapid growth, and changes in texture. Some women also have hair loss during or after pregnancy, or hair that feels dry or thin. Your hair will most likely return to normal after your baby is born. WHAT TO EXPECT AT YOUR PRENATAL VISITS During a routine prenatal visit:  You will be weighed to make sure you and the baby are growing normally.  Your blood pressure will be taken.  Your abdomen will be measured to track your baby's growth.  The fetal heartbeat will be listened to starting around week 10 or 12 of your pregnancy.  Test results from any previous visits will be discussed. Your health care provider may ask you:  How you are feeling.  If you are feeling the baby move.  If you have had any abnormal symptoms, such as leaking fluid, bleeding, severe headaches, or abdominal cramping.  If you are using any tobacco products,   including cigarettes, chewing tobacco, and electronic cigarettes.  If you have any questions. Other tests that may be performed during your first trimester include:  Blood tests to find your blood type and to check for the presence of any previous infections. They will also be used to check for low iron levels (anemia) and Rh antibodies. Later in the pregnancy, blood tests for diabetes will be done along with other tests if problems develop.  Urine tests to check for infections, diabetes, or protein in the urine.  An ultrasound to confirm the proper growth  and development of the baby.  An amniocentesis to check for possible genetic problems.  Fetal screens for spina bifida and Down syndrome.  You may need other tests to make sure you and the baby are doing well.  HIV (human immunodeficiency virus) testing. Routine prenatal testing includes screening for HIV, unless you choose not to have this test. HOME CARE INSTRUCTIONS  Medicines  Follow your health care provider's instructions regarding medicine use. Specific medicines may be either safe or unsafe to take during pregnancy.  Take your prenatal vitamins as directed.  If you develop constipation, try taking a stool softener if your health care provider approves. Diet  Eat regular, well-balanced meals. Choose a variety of foods, such as meat or vegetable-based protein, fish, milk and low-fat dairy products, vegetables, fruits, and whole grain breads and cereals. Your health care provider will help you determine the amount of weight gain that is right for you.  Avoid raw meat and uncooked cheese. These carry germs that can cause birth defects in the baby.  Eating four or five small meals rather than three large meals a day may help relieve nausea and vomiting. If you start to feel nauseous, eating a few soda crackers can be helpful. Drinking liquids between meals instead of during meals also seems to help nausea and vomiting.  If you develop constipation, eat more high-fiber foods, such as fresh vegetables or fruit and whole grains. Drink enough fluids to keep your urine clear or pale yellow. Activity and Exercise  Exercise only as directed by your health care provider. Exercising will help you:  Control your weight.  Stay in shape.  Be prepared for labor and delivery.  Experiencing pain or cramping in the lower abdomen or low back is a good sign that you should stop exercising. Check with your health care provider before continuing normal exercises.  Try to avoid standing for long  periods of time. Move your legs often if you must stand in one place for a long time.  Avoid heavy lifting.  Wear low-heeled shoes, and practice good posture.  You may continue to have sex unless your health care provider directs you otherwise. Relief of Pain or Discomfort  Wear a good support bra for breast tenderness.   Take warm sitz baths to soothe any pain or discomfort caused by hemorrhoids. Use hemorrhoid cream if your health care provider approves.   Rest with your legs elevated if you have leg cramps or low back pain.  If you develop varicose veins in your legs, wear support hose. Elevate your feet for 15 minutes, 3-4 times a day. Limit salt in your diet. Prenatal Care  Schedule your prenatal visits by the twelfth week of pregnancy. They are usually scheduled monthly at first, then more often in the last 2 months before delivery.  Write down your questions. Take them to your prenatal visits.  Keep all your prenatal visits as directed by your   health care provider. Safety  Wear your seat belt at all times when driving.  Make a list of emergency phone numbers, including numbers for family, friends, the hospital, and police and fire departments. General Tips  Ask your health care provider for a referral to a local prenatal education class. Begin classes no later than at the beginning of month 6 of your pregnancy.  Ask for help if you have counseling or nutritional needs during pregnancy. Your health care provider can offer advice or refer you to specialists for help with various needs.  Do not use hot tubs, steam rooms, or saunas.  Do not douche or use tampons or scented sanitary pads.  Do not cross your legs for long periods of time.  Avoid cat litter boxes and soil used by cats. These carry germs that can cause birth defects in the baby and possibly loss of the fetus by miscarriage or stillbirth.  Avoid all smoking, herbs, alcohol, and medicines not prescribed by  your health care provider. Chemicals in these affect the formation and growth of the baby.  Do not use any tobacco products, including cigarettes, chewing tobacco, and electronic cigarettes. If you need help quitting, ask your health care provider. You may receive counseling support and other resources to help you quit.  Schedule a dentist appointment. At home, brush your teeth with a soft toothbrush and be gentle when you floss. SEEK MEDICAL CARE IF:   You have dizziness.  You have mild pelvic cramps, pelvic pressure, or nagging pain in the abdominal area.  You have persistent nausea, vomiting, or diarrhea.  You have a bad smelling vaginal discharge.  You have pain with urination.  You notice increased swelling in your face, hands, legs, or ankles. SEEK IMMEDIATE MEDICAL CARE IF:   You have a fever.  You are leaking fluid from your vagina.  You have spotting or bleeding from your vagina.  You have severe abdominal cramping or pain.  You have rapid weight gain or loss.  You vomit blood or material that looks like coffee grounds.  You are exposed to German measles and have never had them.  You are exposed to fifth disease or chickenpox.  You develop a severe headache.  You have shortness of breath.  You have any kind of trauma, such as from a fall or a car accident.   This information is not intended to replace advice given to you by your health care provider. Make sure you discuss any questions you have with your health care provider.   Document Released: 12/24/2000 Document Revised: 01/20/2014 Document Reviewed: 11/09/2012 [employer] Interactive Patient Education 2016 [employer]  

## 2014-11-23 NOTE — ED Notes (Signed)
E-signature pad error.  Patient verbalized understanding of all discharge instructions.

## 2014-12-15 ENCOUNTER — Emergency Department: Payer: Medicaid Other

## 2014-12-15 ENCOUNTER — Emergency Department
Admission: EM | Admit: 2014-12-15 | Discharge: 2014-12-15 | Disposition: A | Discharge status: Home / Self Care | Payer: Medicaid Other | Attending: Emergency Medicine | Admitting: Emergency Medicine

## 2014-12-15 ENCOUNTER — Encounter: Payer: Self-pay | Admitting: Medical Oncology

## 2014-12-15 DIAGNOSIS — M791 Myalgia, unspecified site: Secondary | ICD-10-CM

## 2014-12-15 DIAGNOSIS — O9989 Other specified diseases and conditions complicating pregnancy, childbirth and the puerperium: Secondary | ICD-10-CM | POA: Diagnosis present

## 2014-12-15 DIAGNOSIS — R1030 Lower abdominal pain, unspecified: Secondary | ICD-10-CM | POA: Insufficient documentation

## 2014-12-15 DIAGNOSIS — Z3A12 12 weeks gestation of pregnancy: Secondary | ICD-10-CM | POA: Insufficient documentation

## 2014-12-15 DIAGNOSIS — R05 Cough: Secondary | ICD-10-CM | POA: Insufficient documentation

## 2014-12-15 DIAGNOSIS — O210 Mild hyperemesis gravidarum: Secondary | ICD-10-CM | POA: Insufficient documentation

## 2014-12-15 LAB — COMPREHENSIVE METABOLIC PANEL
ALBUMIN: 3.3 g/dL — AB (ref 3.5–5.0)
ALK PHOS: 46 U/L (ref 38–126)
ALT: 15 U/L (ref 14–54)
ANION GAP: 8 (ref 5–15)
AST: 17 U/L (ref 15–41)
BUN: 9 mg/dL (ref 6–20)
CALCIUM: 9.5 mg/dL (ref 8.9–10.3)
CHLORIDE: 105 mmol/L (ref 101–111)
CO2: 24 mmol/L (ref 22–32)
CREATININE: 0.54 mg/dL (ref 0.44–1.00)
GFR calc Af Amer: >60 mL/min (ref >60)
GFR calc non Af Amer: >60 mL/min (ref >60)
GLUCOSE: 98 mg/dL (ref 65–99)
Potassium: 3.6 mmol/L (ref 3.5–5.1)
SODIUM: 137 mmol/L (ref 135–145)
Total Bilirubin: 0.2 mg/dL — ABNORMAL LOW (ref 0.3–1.2)
Total Protein: 7.1 g/dL (ref 6.5–8.1)

## 2014-12-15 LAB — URINALYSIS COMPLETE WITH MICROSCOPIC (ARMC ONLY)
BACTERIA UA: NONE SEEN
Bilirubin Urine: NEGATIVE
Glucose, UA: NEGATIVE mg/dL
HGB URINE DIPSTICK: NEGATIVE
KETONES UR: NEGATIVE mg/dL
LEUKOCYTES UA: NEGATIVE
NITRITE: NEGATIVE
PH: 7 (ref 5.0–8.0)
PROTEIN: NEGATIVE mg/dL
SPECIFIC GRAVITY, URINE: 1.021 (ref 1.005–1.030)

## 2014-12-15 LAB — CBC WITH DIFFERENTIAL/PLATELET
BASOS PCT: 1 %
Basophils Absolute: 0.1 10*3/uL (ref 0–0.1)
EOS ABS: 0.4 10*3/uL (ref 0–0.7)
EOS PCT: 3 %
HCT: 36.5 % (ref 35.0–47.0)
HEMOGLOBIN: 12.4 g/dL (ref 12.0–16.0)
Lymphocytes Relative: 17 %
Lymphs Abs: 2.3 10*3/uL (ref 1.0–3.6)
MCH: 29 pg (ref 26.0–34.0)
MCHC: 34 g/dL (ref 32.0–36.0)
MCV: 85.2 fL (ref 80.0–100.0)
Monocytes Absolute: 0.9 10*3/uL (ref 0.2–0.9)
Monocytes Relative: 7 %
NEUTROS PCT: 72 %
Neutro Abs: 9.9 10*3/uL — ABNORMAL HIGH (ref 1.4–6.5)
PLATELETS: 242 10*3/uL (ref 150–440)
RBC: 4.28 MIL/uL (ref 3.80–5.20)
RDW: 13.7 % (ref 11.5–14.5)
WBC: 13.6 10*3/uL — AB (ref 3.6–11.0)

## 2014-12-15 LAB — HCG, QUANTITATIVE, PREGNANCY: HCG, BETA CHAIN, QUANT, S: 92687 m[IU]/mL — AB (ref <5)

## 2014-12-15 MED ORDER — PROMETHAZINE HCL 25 MG PO TABS: 25.0000 mg | ORAL_TABLET | Freq: Four times a day (QID) | ORAL | Status: DC | PRN

## 2014-12-15 MED ORDER — ONDANSETRON HCL 4 MG PO TABS
4.0000 mg | ORAL_TABLET | Freq: Once | ORAL | Status: AC
  Administered 2014-12-15: 4 mg via ORAL
  Filled 2014-12-15: qty 1

## 2014-12-15 MED ORDER — ACETAMINOPHEN 500 MG PO TABS
1000.0000 mg | ORAL_TABLET | Freq: Once | ORAL | Status: AC
  Administered 2014-12-15: 1000 mg via ORAL
  Filled 2014-12-15: qty 2

## 2014-12-15 NOTE — Discharge Instructions (Signed)

## 2014-12-15 NOTE — ED Notes (Signed)
PT reports she is 12w 1 d pregnant with lower abd pain that radiates into legs. Pt denies vaginal bleeding, denies other sxs.

## 2014-12-15 NOTE — ED Provider Notes (Signed)
[facility] Emergency Department Provider Note     Time seen: ----------------------------------------- 9:23 AM on 12/15/2014 -----------------------------------------    I have reviewed the triage vital signs and the nursing notes.   HISTORY  Chief Complaint Generalized Body Aches    HPI Julia Cherry is a 24 y.o. female who presents ER states she is 12 week 1 day pregnant with lower abdominal pain that radiates into her legs. She denies any vaginal bleeding or leakage of fluid. Patient denies fevers chills but complains of diffuse aching in her body. Patient also had a coughing fit while in the ER and vomited.    Past Medical History  Diagnosis Date  . Asthma     There are no active problems to display for this patient.   History reviewed. No pertinent past surgical history.  Allergies Review of patient's allergies indicates no known allergies.  Social History Social History  Substance Use Topics  . Smoking status: Never Smoker   . Smokeless tobacco: None  . Alcohol Use: No    Review of Systems Constitutional: Negative for fever. Eyes: Negative for visual changes. ENT: Negative for sore throat. Cardiovascular: Negative for chest pain. Respiratory: Negative for shortness of breath. Gastrointestinal: Positive for abdominal pain, negative for vomiting or diarrhea.  Genitourinary: Negative for dysuria. Negative for vaginal bleeding or leakage of fluid Musculoskeletal: Negative for back pain. Positive for myalgias Skin: Negative for rash. Neurological: Negative for headaches, focal weakness or numbness.  10-point ROS otherwise negative.  ____________________________________________   PHYSICAL EXAM:  VITAL SIGNS: ED Triage Vitals  Enc Vitals Group     BP 12/15/14 0910 139/65 mmHg     Pulse Rate 12/15/14 0910 92     Resp 12/15/14 0910 17     Temp 12/15/14 0910 98.1 F (36.7 C)     Temp Source 12/15/14 0910 Oral   SpO2 12/15/14 0910 98 %     Weight 12/15/14 0910 177 lb (80.287 kg)     Height 12/15/14 0910 5\' 2"  (1.575 m)     Head Cir --      Peak Flow --      Pain Score 12/15/14 0911 8     Pain Loc --      Pain Edu? --      Excl. in GC? --     Constitutional: Alert and oriented. Well appearing and in no distress. Eyes: Conjunctivae are normal. PERRL. Normal extraocular movements. ENT   Head: Normocephalic and atraumatic.   Nose: No congestion/rhinnorhea.   Mouth/Throat: Mucous membranes are moist.   Neck: No stridor. Cardiovascular: Normal rate, regular rhythm. Normal and symmetric distal pulses are present in all extremities. No murmurs, rubs, or gallops. Respiratory: Normal respiratory effort without tachypnea nor retractions. Breath sounds are clear and equal bilaterally. No wheezes/rales/rhonchi. Gastrointestinal: Soft and nontender. No distention. No abdominal bruits.  Musculoskeletal: Nontender with normal range of motion in all extremities. No joint effusions.  No lower extremity tenderness nor edema. Neurologic:  Normal speech and language. No gross focal neurologic deficits are appreciated. Speech is normal. No gait instability. Skin:  Skin is warm, dry and intact. No rash noted. Psychiatric: Mood and affect are normal. Speech and behavior are normal. Patient exhibits appropriate insight and judgment. ____________________________________________  ED COURSE:  Pertinent labs & imaging results that were available during my care of the patient were reviewed by me and considered in my medical decision making (see chart for details). Patient is no acute distress, will check basic  labs and reevaluate. ____________________________________________    LABS (pertinent positives/negatives)  Labs Reviewed  CBC WITH DIFFERENTIAL/PLATELET - Abnormal; Notable for the following:    WBC 13.6 (*)    Neutro Abs 9.9 (*)    All other components within normal limits  COMPREHENSIVE  METABOLIC PANEL - Abnormal; Notable for the following:    Albumin 3.3 (*)    Total Bilirubin 0.2 (*)    All other components within normal limits  URINALYSIS COMPLETEWITH MICROSCOPIC ([facility] ONLY) - Abnormal; Notable for the following:    Color, Urine YELLOW (*)    APPearance CLEAR (*)    Squamous Epithelial / LPF 6-30 (*)    All other components within normal limits  HCG, QUANTITATIVE, PREGNANCY - Abnormal; Notable for the following:    hCG, Beta Chain, Quant, [place] [address] (*)    All other components within normal limits    RADIOLOGY  Chest x-ray is normal ____________________________________________  FINAL ASSESSMENT AND PLAN  Myalgias  Plan: Patient with labs and imaging as dictated above. No clear exhalation for her symptoms. She is stable for outpatient with her doctor, encouraged Tylenol as needed for pain.   Emily Filbert, MD   Emily Filbert, MD 12/15/14 [phone]

## 2014-12-23 LAB — OB RESULTS CONSOLE RPR: RPR: NONREACTIVE

## 2014-12-23 LAB — OB RESULTS CONSOLE RUBELLA ANTIBODY, IGM: RUBELLA: IMMUNE

## 2014-12-23 LAB — OB RESULTS CONSOLE HIV ANTIBODY (ROUTINE TESTING): HIV: NONREACTIVE

## 2014-12-23 LAB — OB RESULTS CONSOLE HEPATITIS B SURFACE ANTIGEN: HEP B S AG: NEGATIVE

## 2014-12-23 LAB — OB RESULTS CONSOLE VARICELLA ZOSTER ANTIBODY, IGG: VARICELLA IGG: IMMUNE

## 2014-12-24 ENCOUNTER — Emergency Department
Admission: EM | Admit: 2014-12-24 | Discharge: 2014-12-24 | Disposition: A | Discharge status: Home / Self Care | Payer: Medicaid Other | Attending: Emergency Medicine | Admitting: Emergency Medicine

## 2014-12-24 ENCOUNTER — Encounter: Payer: Self-pay | Admitting: Emergency Medicine

## 2014-12-24 DIAGNOSIS — R05 Cough: Secondary | ICD-10-CM

## 2014-12-24 DIAGNOSIS — Z3A15 15 weeks gestation of pregnancy: Secondary | ICD-10-CM | POA: Diagnosis not present

## 2014-12-24 DIAGNOSIS — J4520 Mild intermittent asthma, uncomplicated: Secondary | ICD-10-CM | POA: Diagnosis not present

## 2014-12-24 DIAGNOSIS — R058 Other specified cough: Secondary | ICD-10-CM

## 2014-12-24 DIAGNOSIS — O99512 Diseases of the respiratory system complicating pregnancy, second trimester: Secondary | ICD-10-CM | POA: Diagnosis present

## 2014-12-24 MED ORDER — GUAIFENESIN-CODEINE 100-10 MG/5ML PO SOLN: 10.0000 mL | ORAL | Status: DC | PRN

## 2014-12-24 MED ORDER — AZITHROMYCIN 250 MG PO TABS: ORAL_TABLET | ORAL | Status: DC

## 2014-12-24 NOTE — ED Notes (Signed)
Pt here for cough, congestion, SOB.  Has hx of asthma.  Pt also [redacted] weeks pregnant.

## 2014-12-24 NOTE — Discharge Instructions (Signed)

## 2014-12-24 NOTE — ED Provider Notes (Signed)
[facility] Emergency Department Provider Note  ____________________________________________  Time seen: Approximately 12:33 PM  I have reviewed the triage vital signs and the nursing notes.   HISTORY  Chief Complaint Cough and Nasal Congestion    HPI Julia Cherry is a 24 y.o. female presents for evaluation of cough and congestion 3 days. Patient states that she has a past medical history of asthma bronchitis. Is currently [redacted] weeks pregnant.   Past Medical History  Diagnosis Date  . Asthma     There are no active problems to display for this patient.   No past surgical history on file.  Current Outpatient Rx  Name  Route  Sig  Dispense  Refill  . albuterol (PROVENTIL HFA;VENTOLIN HFA) 108 (90 BASE) MCG/ACT inhaler   Inhalation   Inhale 2 puffs into the lungs every 6 (six) hours as needed for wheezing or shortness of breath.         Marland Kitchen azithromycin (ZITHROMAX Z-PAK) 250 MG tablet      Take 2 tablets (500 mg) on  Day 1,  followed by 1 tablet (250 mg) once daily on Days 2 through 5.   6 each   0   . budesonide-formoterol (SYMBICORT) 160-4.5 MCG/ACT inhaler   Inhalation   Inhale 2 puffs into the lungs 2 (two) times daily as needed (shortness of breath, wheezing).         Marland Kitchen guaiFENesin-codeine 100-10 MG/5ML syrup   Oral   Take 10 mLs by mouth every 4 (four) hours as needed for cough.   180 mL   0   . prochlorperazine (COMPAZINE) 10 MG tablet   Oral   Take 10 mg by mouth 3 (three) times daily as needed (for headache).          . promethazine (PHENERGAN) 25 MG tablet   Oral   Take 1 tablet (25 mg total) by mouth every 6 (six) hours as needed for nausea or vomiting.   20 tablet   0     Allergies Review of patient's allergies indicates no known allergies.  No family history on file.  Social History Social History  Substance Use Topics  . Smoking status: Never Smoker   . Smokeless tobacco: None  . Alcohol Use: No     Review of Systems Constitutional: No fever/chills Eyes: No visual changes. ENT: No sore throat. Cardiovascular: Denies chest pain. Respiratory: Denies shortness of breath. Positive for cough Gastrointestinal: No abdominal pain.  No nausea, no vomiting.  No diarrhea.  No constipation. Genitourinary: Negative for dysuria. Musculoskeletal: Negative for back pain. Skin: Negative for rash. Neurological: Negative for headaches, focal weakness or numbness.  10-point ROS otherwise negative.  ____________________________________________   PHYSICAL EXAM:  VITAL SIGNS: ED Triage Vitals  Enc Vitals Group     BP 12/24/14 1120 121/66 mmHg     Pulse Rate 12/24/14 1120 84     Resp 12/24/14 1120 18     Temp 12/24/14 1120 98.1 F (36.7 C)     Temp Source 12/24/14 1120 Oral     SpO2 12/24/14 1120 99 %     Weight 12/24/14 1120 167 lb (75.751 kg)     Height --      Head Cir --      Peak Flow --      Pain Score 12/24/14 1121 7     Pain Loc --      Pain Edu? --      Excl. in GC? --  Constitutional: Alert and oriented. Well appearing and in no acute distress. Eyes: Conjunctivae are normal. PERRL. EOMI. Head: Atraumatic. Nose: No congestion/rhinnorhea. Mouth/Throat: Mucous membranes are moist.  Oropharynx non-erythematous. Neck: No stridor.   Cardiovascular: Normal rate, regular rhythm. Grossly normal heart sounds.  Good peripheral circulation. Respiratory: Normal respiratory effort.  No retractions. Lungs CTAB. No active wheezing noted. Gastrointestinal: Soft and nontender. No distention. No abdominal bruits. No CVA tenderness. Musculoskeletal: No lower extremity tenderness nor edema.  No joint effusions. Neurologic:  Normal speech and language. No gross focal neurologic deficits are appreciated. No gait instability. Skin:  Skin is warm, dry and intact. No rash noted. Psychiatric: Mood and affect are normal. Speech and behavior are  normal.  ____________________________________________   LABS (all labs ordered are listed, but only abnormal results are displayed)  Labs Reviewed - No data to display ____________________________________________  RADIOLOGY  Deferred secondary to pregnancy. ____________________________________________   PROCEDURES  Procedure(s) performed: None  Critical Care performed: No  ____________________________________________   INITIAL IMPRESSION / ASSESSMENT AND PLAN / ED COURSE  Pertinent labs & imaging results that were available during my care of the patient were reviewed by me and considered in my medical decision making (see chart for details).  Recurrent cough/bronchitis. Rx given for Zithromax and Robitussin-AC. Patient follow-up with PCP or return when necessary any worsening symptomology. Patient voices no other emergency medical complaints at this time. ____________________________________________   FINAL CLINICAL IMPRESSION(S) / ED DIAGNOSES  Final diagnoses:  Allergic cough  Asthma, mild intermittent, uncomplicated      Evangeline DakinCharles M Beers, PA-C 12/24/14 1237  Phineas SemenGraydon Goodman, MD 12/24/14 1329

## 2014-12-27 ENCOUNTER — Encounter: Payer: Self-pay | Admitting: Emergency Medicine

## 2014-12-27 DIAGNOSIS — Z792 Long term (current) use of antibiotics: Secondary | ICD-10-CM | POA: Diagnosis not present

## 2014-12-27 DIAGNOSIS — R42 Dizziness and giddiness: Secondary | ICD-10-CM | POA: Insufficient documentation

## 2014-12-27 DIAGNOSIS — R739 Hyperglycemia, unspecified: Secondary | ICD-10-CM | POA: Diagnosis not present

## 2014-12-27 DIAGNOSIS — Z3A16 16 weeks gestation of pregnancy: Secondary | ICD-10-CM | POA: Diagnosis not present

## 2014-12-27 DIAGNOSIS — O9989 Other specified diseases and conditions complicating pregnancy, childbirth and the puerperium: Secondary | ICD-10-CM | POA: Diagnosis present

## 2014-12-27 DIAGNOSIS — R51 Headache: Secondary | ICD-10-CM | POA: Diagnosis not present

## 2014-12-27 LAB — CBC
HEMATOCRIT: 37.7 % (ref 35.0–47.0)
Hemoglobin: 12.7 g/dL (ref 12.0–16.0)
MCH: 28.7 pg (ref 26.0–34.0)
MCHC: 33.6 g/dL (ref 32.0–36.0)
MCV: 85.3 fL (ref 80.0–100.0)
PLATELETS: 271 10*3/uL (ref 150–440)
RBC: 4.41 MIL/uL (ref 3.80–5.20)
RDW: 13.3 % (ref 11.5–14.5)
WBC: 13.1 10*3/uL — ABNORMAL HIGH (ref 3.6–11.0)

## 2014-12-27 LAB — BASIC METABOLIC PANEL
Anion gap: 8 (ref 5–15)
BUN: 8 mg/dL (ref 6–20)
CHLORIDE: 102 mmol/L (ref 101–111)
CO2: 24 mmol/L (ref 22–32)
CREATININE: 0.52 mg/dL (ref 0.44–1.00)
Calcium: 9.2 mg/dL (ref 8.9–10.3)
GFR calc Af Amer: >60 mL/min (ref >60)
GFR calc non Af Amer: >60 mL/min (ref >60)
GLUCOSE: 86 mg/dL (ref 65–99)
POTASSIUM: 3.3 mmol/L — AB (ref 3.5–5.1)
SODIUM: 134 mmol/L — AB (ref 135–145)

## 2014-12-27 LAB — GLUCOSE, CAPILLARY: Glucose-Capillary: 82 mg/dL (ref 65–99)

## 2014-12-27 NOTE — ED Notes (Signed)
Pt arrived to the ED for complaints of "high blood sugar" and elevated heart rate. Pt report that she checked her BG at home and it was 229 at 20:30. Pt states that she is [redacted] week pregnant on her second pregnancy with no previous complications. Pt is AOx4 in no apparent distress.

## 2014-12-28 ENCOUNTER — Emergency Department
Admission: EM | Admit: 2014-12-28 | Discharge: 2014-12-28 | Disposition: A | Discharge status: Home / Self Care | Payer: Medicaid Other | Attending: Emergency Medicine | Admitting: Emergency Medicine

## 2014-12-28 DIAGNOSIS — R739 Hyperglycemia, unspecified: Secondary | ICD-10-CM

## 2014-12-28 DIAGNOSIS — R42 Dizziness and giddiness: Secondary | ICD-10-CM

## 2014-12-28 NOTE — ED Notes (Signed)
Pt c/o dizziness earlier tonight was told her blood glucose was 229, she called her OB was told to come here, our glucose 86, pt denies dizziness at this time.

## 2014-12-28 NOTE — Discharge Instructions (Signed)
Dizziness Dizziness is a common problem. It makes you feel unsteady or lightheaded. You may feel like you are about to pass out (faint). Dizziness can lead to injury if you stumble or fall. Anyone can get dizzy, but dizziness is more common in older adults. This condition can be caused by a number of things, including:  Medicines.  Dehydration.  Illness. HOME CARE Following these instructions may help with your condition: Eating and Drinking  Drink enough fluid to keep your pee (urine) clear or pale yellow. This helps to keep you from getting dehydrated. Try to drink more clear fluids, such as water.  Do not drink alcohol.  Limit how much caffeine you drink or eat if told by your doctor.  Limit how much salt you drink or eat if told by your doctor. Activity  Avoid making quick movements.  When you stand up from sitting in a chair, steady yourself until you feel okay.  In the morning, first sit up on the side of the bed. When you feel okay, stand slowly while you hold onto something. Do this until you know that your balance is fine.  Move your legs often if you need to stand in one place for a long time. Tighten and relax your muscles in your legs while you are standing.  Do not drive or use heavy machinery if you feel dizzy.  Avoid bending down if you feel dizzy. Place items in your home so that they are easy for you to reach without leaning over. Lifestyle  Do not use any tobacco products, including cigarettes, chewing tobacco, or electronic cigarettes. If you need help quitting, ask your doctor.  Try to lower your stress level, such as with yoga or meditation. Talk with your doctor if you need help. General Instructions  Watch your dizziness for any changes.  Take medicines only as told by your doctor. Talk with your doctor if you think that your dizziness is caused by a medicine that you are taking.  Tell a friend or a family member that you are feeling dizzy. If he or  she notices any changes in your behavior, have this person call your doctor.  Keep all follow-up visits as told by your doctor. This is important. GET HELP IF:  Your dizziness does not go away.  Your dizziness or light-headedness gets worse.  You feel sick to your stomach (nauseous).  You have trouble hearing.  You have new symptoms.  You are unsteady on your feet or you feel like the room is spinning. GET HELP RIGHT AWAY IF:  You throw up (vomit) or have diarrhea and are unable to eat or drink anything.  You have trouble:  Talking.  Walking.  Swallowing.  Using your arms, hands, or legs.  You feel generally weak.  You are not thinking clearly or you have trouble forming sentences. It may take a friend or family member to notice this.  You have:  Chest pain.  Pain in your belly (abdomen).  Shortness of breath.  Sweating.  Your vision changes.  You are bleeding.  You have a headache.  You have neck pain or a stiff neck.  You have a fever.   This information is not intended to replace advice given to you by your health care provider. Make sure you discuss any questions you have with your health care provider.   Document Released: 12/19/2010 Document Revised: 05/16/2014 Document Reviewed: 12/26/2013 Elsevier Interactive Patient Education 2016 [employer].  Hyperglycemia Hyperglycemia occurs when  the glucose (sugar) in your blood is too high. Hyperglycemia can happen for many reasons, but it most often happens to people who do not know they have diabetes or are not managing their diabetes properly.  CAUSES  Whether you have diabetes or not, there are other causes of hyperglycemia. Hyperglycemia can occur when you have diabetes, but it can also occur in other situations that you might not be as aware of, such as: Diabetes  If you have diabetes and are having problems controlling your blood glucose, hyperglycemia could occur because of some of the  following reasons:  Not following your meal plan.  Not taking your diabetes medications or not taking it properly.  Exercising less or doing less activity than you normally do.  Being sick. Pre-diabetes  This cannot be ignored. Before people develop Type 2 diabetes, they almost always have "pre-diabetes." This is when your blood glucose levels are higher than normal, but not yet high enough to be diagnosed as diabetes. Research has shown that some long-term damage to the body, especially the heart and circulatory system, may already be occurring during pre-diabetes. If you take action to manage your blood glucose when you have pre-diabetes, you may delay or prevent Type 2 diabetes from developing. Stress  If you have diabetes, you may be "diet" controlled or on oral medications or insulin to control your diabetes. However, you may find that your blood glucose is higher than usual in the hospital whether you have diabetes or not. This is often referred to as "stress hyperglycemia." Stress can elevate your blood glucose. This happens because of hormones put out by the body during times of stress. If stress has been the cause of your high blood glucose, it can be followed regularly by your caregiver. That way he/she can make sure your hyperglycemia does not continue to get worse or progress to diabetes. Steroids  Steroids are medications that act on the infection fighting system (immune system) to block inflammation or infection. One side effect can be a rise in blood glucose. Most people can produce enough extra insulin to allow for this rise, but for those who cannot, steroids make blood glucose levels go even higher. It is not unusual for steroid treatments to "uncover" diabetes that is developing. It is not always possible to determine if the hyperglycemia will go away after the steroids are stopped. A special blood test called an A1c is sometimes done to determine if your blood glucose was  elevated before the steroids were started. SYMPTOMS  Thirsty.  Frequent urination.  Dry mouth.  Blurred vision.  Tired or fatigue.  Weakness.  Sleepy.  Tingling in feet or leg. DIAGNOSIS  Diagnosis is made by monitoring blood glucose in one or all of the following ways:  A1c test. This is a chemical found in your blood.  Fingerstick blood glucose monitoring.  Laboratory results. TREATMENT  First, knowing the cause of the hyperglycemia is important before the hyperglycemia can be treated. Treatment may include, but is not be limited to:  Education.  Change or adjustment in medications.  Change or adjustment in meal plan.  Treatment for an illness, infection, etc.  More frequent blood glucose monitoring.  Change in exercise plan.  Decreasing or stopping steroids.  Lifestyle changes. HOME CARE INSTRUCTIONS   Test your blood glucose as directed.  Exercise regularly. Your caregiver will give you instructions about exercise. Pre-diabetes or diabetes which comes on with stress is helped by exercising.  Eat wholesome, balanced meals. Eat  often and at regular, fixed times. Your caregiver or nutritionist will give you a meal plan to guide your sugar intake.  Being at an ideal weight is important. If needed, losing as little as 10 to 15 pounds may help improve blood glucose levels. SEEK MEDICAL CARE IF:   You have questions about medicine, activity, or diet.  You continue to have symptoms (problems such as increased thirst, urination, or weight gain). SEEK IMMEDIATE MEDICAL CARE IF:   You are vomiting or have diarrhea.  Your breath smells fruity.  You are breathing faster or slower.  You are very sleepy or incoherent.  You have numbness, tingling, or pain in your feet or hands.  You have chest pain.  Your symptoms get worse even though you have been following your caregiver's orders.  If you have any other questions or concerns.   This information is  not intended to replace advice given to you by your health care provider. Make sure you discuss any questions you have with your health care provider.   Document Released: 06/25/2000 Document Revised: 03/24/2011 Document Reviewed: 09/05/2014 Elsevier Interactive Patient Education [employer]

## 2014-12-28 NOTE — ED Provider Notes (Signed)
[facility] Emergency Department Provider Note  ____________________________________________  Time seen: Approximately 12:30 AM  I have reviewed the triage vital signs and the nursing notes.   HISTORY  Chief Complaint Hyperglycemia    HPI Julia Cherry is a 24 y.o. female at 7416 weeks pregnant who is presenting tonight with elevated glucose. She said that she took her sugar at work at about 7 PM because she was feeling dizzy and had a headache. She said that they found her glucose to be 229. She said that she last ate at 5 PM and only ate a bag of chips and grapes. She denies taking any soda at that time. Said that she did have associated about 1 PM this afternoon. She denies any history of diabetes. Says that she does have a history of diabetes and her family though. She says the headache is frontal and a 7 out of 10 now. He said it was gradual in onset and started about 4 PM this afternoon. She is reported her headaches to her OB/GYN, Dr. Jean RosenthalJackson, who is giving her "medication" for this. She is not sure what the medication is. She says that she doesn't a history of migraines and this feels like her migraines in the past. She says that the pain is frontal and throbbing and it is worsened by light. She did not report any weakness or numbness. Denies any belly pain or nausea or vomiting. Has a known intrauterine pregnancy with confirmed ultrasound [facility].she says the dizziness has since resolved.  Past Medical History  Diagnosis Date  . Asthma     There are no active problems to display for this patient.   History reviewed. No pertinent past surgical history.  Current Outpatient Rx  Name  Route  Sig  Dispense  Refill  . albuterol (PROVENTIL HFA;VENTOLIN HFA) 108 (90 BASE) MCG/ACT inhaler   Inhalation   Inhale 2 puffs into the lungs every 6 (six) hours as needed for wheezing or shortness of breath.         Marland Kitchen. azithromycin  (ZITHROMAX Z-PAK) 250 MG tablet      Take 2 tablets (500 mg) on  Day 1,  followed by 1 tablet (250 mg) once daily on Days 2 through 5.   6 each   0   . budesonide-formoterol (SYMBICORT) 160-4.5 MCG/ACT inhaler   Inhalation   Inhale 2 puffs into the lungs 2 (two) times daily as needed (shortness of breath, wheezing).         Marland Kitchen. guaiFENesin-codeine 100-10 MG/5ML syrup   Oral   Take 10 mLs by mouth every 4 (four) hours as needed for cough.   180 mL   0   . prochlorperazine (COMPAZINE) 10 MG tablet   Oral   Take 10 mg by mouth 3 (three) times daily as needed (for headache).          . promethazine (PHENERGAN) 25 MG tablet   Oral   Take 1 tablet (25 mg total) by mouth every 6 (six) hours as needed for nausea or vomiting.   20 tablet   0     Allergies Review of patient's allergies indicates no known allergies.  History reviewed. No pertinent family history.  Social History Social History  Substance Use Topics  . Smoking status: Never Smoker   . Smokeless tobacco: None  . Alcohol Use: No    Review of Systems Constitutional: No fever/chills Eyes: No visual changes. ENT: No sore throat.  Cardiovascular: Denies chest pain. Respiratory: Denies shortness of breath. Gastrointestinal: No abdominal pain.  No nausea, no vomiting.  No diarrhea.  No constipation. Genitourinary: Negative for dysuria. Musculoskeletal: Negative for back pain. Skin: Negative for rash. Neurological: Negative for  focal weakness or numbness.  10-point ROS otherwise negative.  ____________________________________________   PHYSICAL EXAM:  VITAL SIGNS: ED Triage Vitals  Enc Vitals Group     BP 12/27/14 2153 138/78 mmHg     Pulse Rate 12/27/14 2153 89     Resp 12/27/14 2153 16     Temp 12/27/14 2153 98.3 F (36.8 C)     Temp Source 12/27/14 2153 Oral     SpO2 12/27/14 2153 99 %     Weight 12/27/14 2153 168 lb (76.204 kg)     Height 12/27/14 2153  (1.575 m)     Head Cir --       Peak Flow --      Pain Score 12/27/14 2154 0     Pain Loc --      Pain Edu? --      Excl. in GC? --     Constitutional: Alert and oriented. Well appearing and in no acute distress. Eyes: Conjunctivae are normal. PERRL. EOMI. Head: Atraumatic. Nose: No congestion/rhinnorhea. Mouth/Throat: Mucous membranes are moist.  Neck: No stridor.   Cardiovascular: Normal rate, regular rhythm. Grossly normal heart sounds.  Good peripheral circulation. Respiratory: Normal respiratory effort.  No retractions. Lungs CTAB. Gastrointestinal: Soft and nontender. No distention.  Musculoskeletal: No lower extremity tenderness nor edema.  No joint effusions. Neurologic:  Normal speech and language. No gross focal neurologic deficits are appreciated. No gait instability. Skin:  Skin is warm, dry and intact. No rash noted. Psychiatric: Mood and affect are normal. Speech and behavior are normal.  ____________________________________________   LABS (all labs ordered are listed, but only abnormal results are displayed)  Labs Reviewed  BASIC METABOLIC PANEL - Abnormal; Notable for the following:    Sodium 134 (*)    Potassium 3.3 (*)    All other components within normal limits  CBC - Abnormal; Notable for the following:    WBC 13.1 (*)    All other components within normal limits  GLUCOSE, CAPILLARY  URINALYSIS COMPLETEWITH MICROSCOPIC ([facility] ONLY)  CBG MONITORING, ED   ____________________________________________  EKG   ____________________________________________  RADIOLOGY   ____________________________________________   PROCEDURES   ____________________________________________   INITIAL IMPRESSION / ASSESSMENT AND PLAN / ED COURSE  Pertinent labs & imaging results that were available during my care of the patient were reviewed by me and considered in my medical decision making (see chart for details).  ----------------------------------------- 12:43 AM on  12/28/2014 -----------------------------------------  Patient with glucose which is resolved to normal at this time. Reading was a one-time reading at work and it is unclear why was elevated. It does not appear that she had a big sugar load prior to this reading. Well she was symptomatically with dizziness and this could be from the high blood sugar think also faulty reading was considered. Despite, I believe the best course of action at this time is for her to follow up with her OB/GYN. She says that she will call Dr. Jean Rosenthal in the morning to schedule follow-up. Her headaches appear to be recurrent and there are no objective neurological findings at this time. Also no sudden onset or worst headache of her life. Do not suspect intracranial hemorrhage. ____________________________________________   FINAL CLINICAL IMPRESSION(S) / ED DIAGNOSES  Dizziness.  Hyperglycemia.    Myrna Blazer, MD 12/28/14 [phone]
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
## 2014-12-29 ENCOUNTER — Ambulatory Visit
Admission: RE | Admit: 2014-12-29 | Discharge: 2014-12-29 | Disposition: A | Discharge status: Home / Self Care | Payer: Medicaid Other | Source: Ambulatory Visit | Attending: Certified Nurse Midwife | Admitting: Certified Nurse Midwife

## 2014-12-29 ENCOUNTER — Other Ambulatory Visit: Payer: Self-pay | Admitting: Certified Nurse Midwife

## 2014-12-29 DIAGNOSIS — R0602 Shortness of breath: Secondary | ICD-10-CM | POA: Diagnosis present

## 2014-12-29 DIAGNOSIS — R0989 Other specified symptoms and signs involving the circulatory and respiratory systems: Secondary | ICD-10-CM | POA: Diagnosis not present

## 2014-12-29 DIAGNOSIS — O99519 Diseases of the respiratory system complicating pregnancy, unspecified trimester: Secondary | ICD-10-CM | POA: Diagnosis not present

## 2015-01-08 ENCOUNTER — Emergency Department: Payer: Medicaid Other

## 2015-01-08 ENCOUNTER — Encounter: Payer: Self-pay | Admitting: Emergency Medicine

## 2015-01-08 ENCOUNTER — Emergency Department
Admission: EM | Admit: 2015-01-08 | Discharge: 2015-01-08 | Disposition: A | Discharge status: Home / Self Care | Payer: Medicaid Other | Attending: Emergency Medicine | Admitting: Emergency Medicine

## 2015-01-08 DIAGNOSIS — O24112 Pre-existing diabetes mellitus, type 2, in pregnancy, second trimester: Secondary | ICD-10-CM | POA: Insufficient documentation

## 2015-01-08 DIAGNOSIS — O9A212 Injury, poisoning and certain other consequences of external causes complicating pregnancy, second trimester: Secondary | ICD-10-CM | POA: Diagnosis present

## 2015-01-08 DIAGNOSIS — O10012 Pre-existing essential hypertension complicating pregnancy, second trimester: Secondary | ICD-10-CM | POA: Diagnosis not present

## 2015-01-08 DIAGNOSIS — Y9389 Activity, other specified: Secondary | ICD-10-CM | POA: Diagnosis not present

## 2015-01-08 DIAGNOSIS — Y92410 Unspecified street and highway as the place of occurrence of the external cause: Secondary | ICD-10-CM | POA: Diagnosis not present

## 2015-01-08 DIAGNOSIS — S3991XA Unspecified injury of abdomen, initial encounter: Secondary | ICD-10-CM | POA: Diagnosis not present

## 2015-01-08 DIAGNOSIS — Z041 Encounter for examination and observation following transport accident: Secondary | ICD-10-CM

## 2015-01-08 DIAGNOSIS — Z3A16 16 weeks gestation of pregnancy: Secondary | ICD-10-CM | POA: Insufficient documentation

## 2015-01-08 DIAGNOSIS — Y998 Other external cause status: Secondary | ICD-10-CM | POA: Diagnosis not present

## 2015-01-08 DIAGNOSIS — Z792 Long term (current) use of antibiotics: Secondary | ICD-10-CM | POA: Insufficient documentation

## 2015-01-08 HISTORY — DX: Essential (primary) hypertension: I10

## 2015-01-08 HISTORY — DX: Panic disorder (episodic paroxysmal anxiety): F41.0

## 2015-01-08 MED ORDER — ACETAMINOPHEN 325 MG PO TABS
650.0000 mg | ORAL_TABLET | Freq: Once | ORAL | Status: AC
  Administered 2015-01-08: 650 mg via ORAL
  Filled 2015-01-08: qty 2
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
## 2015-01-08 NOTE — ED Notes (Signed)
While RN in room patient started c/o feeling pressure as if she needed to "bare down", patient was checked with no presenting parts showing. MD was informed and is at bedside.
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
## 2015-01-08 NOTE — ED Notes (Signed)
Fetal heart tone 136
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
## 2015-01-17 ENCOUNTER — Emergency Department: Payer: Medicaid Other

## 2015-01-17 ENCOUNTER — Emergency Department
Admission: EM | Admit: 2015-01-17 | Discharge: 2015-01-17 | Disposition: A | Discharge status: Home / Self Care | Payer: Medicaid Other | Attending: Emergency Medicine | Admitting: Emergency Medicine

## 2015-01-17 DIAGNOSIS — O24112 Pre-existing diabetes mellitus, type 2, in pregnancy, second trimester: Secondary | ICD-10-CM | POA: Insufficient documentation

## 2015-01-17 DIAGNOSIS — R51 Headache: Secondary | ICD-10-CM | POA: Diagnosis not present

## 2015-01-17 DIAGNOSIS — Z3A18 18 weeks gestation of pregnancy: Secondary | ICD-10-CM | POA: Insufficient documentation

## 2015-01-17 DIAGNOSIS — O10012 Pre-existing essential hypertension complicating pregnancy, second trimester: Secondary | ICD-10-CM | POA: Diagnosis not present

## 2015-01-17 DIAGNOSIS — R519 Headache, unspecified: Secondary | ICD-10-CM

## 2015-01-17 DIAGNOSIS — O209 Hemorrhage in early pregnancy, unspecified: Secondary | ICD-10-CM | POA: Insufficient documentation

## 2015-01-17 DIAGNOSIS — O9989 Other specified diseases and conditions complicating pregnancy, childbirth and the puerperium: Secondary | ICD-10-CM | POA: Insufficient documentation

## 2015-01-17 DIAGNOSIS — O4692 Antepartum hemorrhage, unspecified, second trimester: Secondary | ICD-10-CM

## 2015-01-17 DIAGNOSIS — Z3490 Encounter for supervision of normal pregnancy, unspecified, unspecified trimester: Secondary | ICD-10-CM

## 2015-01-17 LAB — CBC WITH DIFFERENTIAL/PLATELET
BASOS ABS: 0 10*3/uL (ref 0–0.1)
BASOS PCT: 0 %
EOS ABS: 0.3 10*3/uL (ref 0–0.7)
Eosinophils Relative: 2 %
HCT: 36.4 % (ref 35.0–47.0)
HEMOGLOBIN: 12.3 g/dL (ref 12.0–16.0)
Lymphocytes Relative: 16 %
Lymphs Abs: 2.2 10*3/uL (ref 1.0–3.6)
MCH: 28.7 pg (ref 26.0–34.0)
MCHC: 33.7 g/dL (ref 32.0–36.0)
MCV: 85.1 fL (ref 80.0–100.0)
MONO ABS: 1.1 10*3/uL — AB (ref 0.2–0.9)
MONOS PCT: 8 %
NEUTROS ABS: 10.1 10*3/uL — AB (ref 1.4–6.5)
NEUTROS PCT: 74 %
Platelets: 246 10*3/uL (ref 150–440)
RBC: 4.28 MIL/uL (ref 3.80–5.20)
RDW: 13.6 % (ref 11.5–14.5)
WBC: 13.8 10*3/uL — ABNORMAL HIGH (ref 3.6–11.0)

## 2015-01-17 LAB — URINALYSIS COMPLETE WITH MICROSCOPIC (ARMC ONLY)
BILIRUBIN URINE: NEGATIVE
Bacteria, UA: NONE SEEN
Glucose, UA: NEGATIVE mg/dL
Hgb urine dipstick: NEGATIVE
KETONES UR: NEGATIVE mg/dL
Leukocytes, UA: NEGATIVE
NITRITE: NEGATIVE
Protein, ur: NEGATIVE mg/dL
SPECIFIC GRAVITY, URINE: 1.019 (ref 1.005–1.030)
pH: 6 (ref 5.0–8.0)

## 2015-01-17 LAB — COMPREHENSIVE METABOLIC PANEL
ALK PHOS: 45 U/L (ref 38–126)
ALT: 131 U/L — AB (ref 14–54)
ANION GAP: 8 (ref 5–15)
AST: 108 U/L — ABNORMAL HIGH (ref 15–41)
Albumin: 3.1 g/dL — ABNORMAL LOW (ref 3.5–5.0)
BILIRUBIN TOTAL: 0.5 mg/dL (ref 0.3–1.2)
BUN: 6 mg/dL (ref 6–20)
CALCIUM: 8.8 mg/dL — AB (ref 8.9–10.3)
CO2: 21 mmol/L — AB (ref 22–32)
CREATININE: 0.49 mg/dL (ref 0.44–1.00)
Chloride: 107 mmol/L (ref 101–111)
Glucose, Bld: 102 mg/dL — ABNORMAL HIGH (ref 65–99)
Potassium: 3.5 mmol/L (ref 3.5–5.1)
SODIUM: 136 mmol/L (ref 135–145)
TOTAL PROTEIN: 6.7 g/dL (ref 6.5–8.1)

## 2015-01-17 LAB — ABO/RH: ABO/RH(D): O POS

## 2015-01-17 LAB — LIPASE, BLOOD: LIPASE: 18 U/L (ref 11–51)

## 2015-01-17 LAB — HCG, QUANTITATIVE, PREGNANCY: HCG, BETA CHAIN, QUANT, S: 15181 m[IU]/mL — AB (ref <5)

## 2015-01-17 MED ORDER — METOCLOPRAMIDE HCL 10 MG PO TABS: 10.0000 mg | ORAL_TABLET | Freq: Three times a day (TID) | ORAL | Status: DC

## 2015-01-17 MED ORDER — METOCLOPRAMIDE HCL 10 MG PO TABS
10.0000 mg | ORAL_TABLET | Freq: Once | ORAL | Status: AC
  Administered 2015-01-17: 10 mg via ORAL
  Filled 2015-01-17: qty 1

## 2015-01-17 MED ORDER — FENTANYL CITRATE (PF) 100 MCG/2ML IJ SOLN
50.0000 ug | Freq: Once | INTRAMUSCULAR | Status: AC
  Administered 2015-01-17: 50 ug via INTRAMUSCULAR
  Filled 2015-01-17: qty 2

## 2015-01-17 MED ORDER — DIPHENHYDRAMINE HCL 25 MG PO CAPS
25.0000 mg | ORAL_CAPSULE | Freq: Once | ORAL | Status: AC
  Administered 2015-01-17: 25 mg via ORAL
  Filled 2015-01-17: qty 1

## 2015-01-17 MED ORDER — DIPHENHYDRAMINE HCL 25 MG PO CAPS: 50.0000 mg | ORAL_CAPSULE | Freq: Four times a day (QID) | ORAL | Status: DC | PRN
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
## 2015-01-17 NOTE — Discharge Instructions (Signed)
Your pelvic ultrasound was unremarkable today and shows a live intrauterine pregnancy without complication. Stop taking Tylenol immediately due to abnormalities in your liver on the blood tests.  Please make an appointment with your OB/GYN for follow-up regarding your vaginal bleeding as well as your abnormal liver function tests. Please return to the emergency department if you develop severe pain, inability to keep down fluids, fever, or any other symptoms concerning to you.  General Headache Without Cause A headache is pain or discomfort felt around the head or neck area. The specific cause of a headache may not be found. There are many causes and types of headaches. A few common ones are:  Tension headaches.  Migraine headaches.  Cluster headaches.  Chronic daily headaches. HOME CARE INSTRUCTIONS  Watch your condition for any changes. Take these steps to help with your condition: Managing Pain  Take over-the-counter and prescription medicines only as told by your health care provider.  Lie down in a dark, quiet room when you have a headache.  If directed, apply ice to the head and neck area:  Put ice in a plastic bag.  Place a towel between your skin and the bag.  Leave the ice on for 20 minutes, 2-3 times per day.  Use a heating pad or hot shower to apply heat to the head and neck area as told by your health care provider.  Keep lights dim if bright lights bother you or make your headaches worse. Eating and Drinking  Eat meals on a regular schedule.  Limit alcohol use.  Decrease the amount of caffeine you drink, or stop drinking caffeine. General Instructions  Keep all follow-up visits as told by your health care provider. This is important.  Keep a headache journal to help find out what may trigger your headaches. For example, write down:  What you eat and drink.  How much sleep you get.  Any change to your diet or medicines.  Try massage or other relaxation  techniques.  Limit stress.  Sit up straight, and do not tense your muscles.  Do not use tobacco products, including cigarettes, chewing tobacco, or e-cigarettes. If you need help quitting, ask your health care provider.  Exercise regularly as told by your health care provider.  Sleep on a regular schedule. Get 7-9 hours of sleep, or the amount recommended by your health care provider. SEEK MEDICAL CARE IF:   Your symptoms are not helped by medicine.  You have a headache that is different from the usual headache.  You have nausea or you vomit.  You have a fever. SEEK IMMEDIATE MEDICAL CARE IF:   Your headache becomes severe.  You have repeated vomiting.  You have a stiff neck.  You have a loss of vision.  You have problems with speech.  You have pain in the eye or ear.  You have muscular weakness or loss of muscle control.  You lose your balance or have trouble walking.  You feel faint or pass out.  You have confusion.   This information is not intended to replace advice given to you by your health care provider. Make sure you discuss any questions you have with your health care provider.   Document Released: 12/30/2004 Document Revised: 09/20/2014 Document Reviewed: 04/24/2014 Elsevier Interactive Patient Education 2016 [employer].  Second Trimester of Pregnancy The second trimester is from week 13 through week 28, months 4 through 6. The second trimester is often a time when you feel your best. Your body  has also adjusted to being pregnant, and you begin to feel better physically. Usually, morning sickness has lessened or quit completely, you may have more energy, and you may have an increase in appetite. The second trimester is also a time when the fetus is growing rapidly. At the end of the sixth month, the fetus is about 9 inches long and weighs about 1 pounds. You will likely begin to feel the baby move (quickening) between 18 and 20 weeks of the  pregnancy. BODY CHANGES Your body goes through many changes during pregnancy. The changes vary from woman to woman.   Your weight will continue to increase. You will notice your lower abdomen bulging out.  You may begin to get stretch marks on your hips, abdomen, and breasts.  You may develop headaches that can be relieved by medicines approved by your health care provider.  You may urinate more often because the fetus is pressing on your bladder.  You may develop or continue to have heartburn as a result of your pregnancy.  You may develop constipation because certain hormones are causing the muscles that push waste through your intestines to slow down.  You may develop hemorrhoids or swollen, bulging veins (varicose veins).  You may have back pain because of the weight gain and pregnancy hormones relaxing your joints between the bones in your pelvis and as a result of a shift in weight and the muscles that support your balance.  Your breasts will continue to grow and be tender.  Your gums may bleed and may be sensitive to brushing and flossing.  Dark spots or blotches (chloasma, mask of pregnancy) may develop on your face. This will likely fade after the baby is born.  A dark line from your belly button to the pubic area (linea nigra) may appear. This will likely fade after the baby is born.  You may have changes in your hair. These can include thickening of your hair, rapid growth, and changes in texture. Some women also have hair loss during or after pregnancy, or hair that feels dry or thin. Your hair will most likely return to normal after your baby is born. WHAT TO EXPECT AT YOUR PRENATAL VISITS During a routine prenatal visit:  You will be weighed to make sure you and the fetus are growing normally.  Your blood pressure will be taken.  Your abdomen will be measured to track your baby's growth.  The fetal heartbeat will be listened to.  Any test results from the  previous visit will be discussed. Your health care provider may ask you:  How you are feeling.  If you are feeling the baby move.  If you have had any abnormal symptoms, such as leaking fluid, bleeding, severe headaches, or abdominal cramping.  If you are using any tobacco products, including cigarettes, chewing tobacco, and electronic cigarettes.  If you have any questions. Other tests that may be performed during your second trimester include:  Blood tests that check for:  Low iron levels (anemia).  Gestational diabetes (between 24 and 28 weeks).  Rh antibodies.  Urine tests to check for infections, diabetes, or protein in the urine.  An ultrasound to confirm the proper growth and development of the baby.  An amniocentesis to check for possible genetic problems.  Fetal screens for spina bifida and Down syndrome.  HIV (human immunodeficiency virus) testing. Routine prenatal testing includes screening for HIV, unless you choose not to have this test. HOME CARE INSTRUCTIONS   Avoid all  smoking, herbs, alcohol, and unprescribed drugs. These chemicals affect the formation and growth of the baby.  Do not use any tobacco products, including cigarettes, chewing tobacco, and electronic cigarettes. If you need help quitting, ask your health care provider. You may receive counseling support and other resources to help you quit.  Follow your health care provider's instructions regarding medicine use. There are medicines that are either safe or unsafe to take during pregnancy.  Exercise only as directed by your health care provider. Experiencing uterine cramps is a good sign to stop exercising.  Continue to eat regular, healthy meals.  Wear a good support bra for breast tenderness.  Do not use hot tubs, steam rooms, or saunas.  Wear your seat belt at all times when driving.  Avoid raw meat, uncooked cheese, cat litter boxes, and soil used by cats. These carry germs that can  cause birth defects in the baby.  Take your prenatal vitamins.  Take 1500-2000 mg of calcium daily starting at the 20th week of pregnancy until you deliver your baby.  Try taking a stool softener (if your health care provider approves) if you develop constipation. Eat more high-fiber foods, such as fresh vegetables or fruit and whole grains. Drink plenty of fluids to keep your urine clear or pale yellow.  Take warm sitz baths to soothe any pain or discomfort caused by hemorrhoids. Use hemorrhoid cream if your health care provider approves.  If you develop varicose veins, wear support hose. Elevate your feet for 15 minutes, 3-4 times a day. Limit salt in your diet.  Avoid heavy lifting, wear low heel shoes, and practice good posture.  Rest with your legs elevated if you have leg cramps or low back pain.  Visit your dentist if you have not gone yet during your pregnancy. Use a soft toothbrush to brush your teeth and be gentle when you floss.  A sexual relationship may be continued unless your health care provider directs you otherwise.  Continue to go to all your prenatal visits as directed by your health care provider. SEEK MEDICAL CARE IF:   You have dizziness.  You have mild pelvic cramps, pelvic pressure, or nagging pain in the abdominal area.  You have persistent nausea, vomiting, or diarrhea.  You have a bad smelling vaginal discharge.  You have pain with urination. SEEK IMMEDIATE MEDICAL CARE IF:   You have a fever.  You are leaking fluid from your vagina.  You have spotting or bleeding from your vagina.  You have severe abdominal cramping or pain.  You have rapid weight gain or loss.  You have shortness of breath with chest pain.  You notice sudden or extreme swelling of your face, hands, ankles, feet, or legs.  You have not felt your baby move in over an hour.  You have severe headaches that do not go away with medicine.  You have vision changes.   This  information is not intended to replace advice given to you by your health care provider. Make sure you discuss any questions you have with your health care provider.   Document Released: 12/24/2000 Document Revised: 01/20/2014 Document Reviewed: 03/02/2012 Elsevier Interactive Patient Education 2016 Elsevier Inc.  Vaginal Bleeding During Pregnancy, Second Trimester A small amount of bleeding (spotting) from the vagina is relatively common in pregnancy. It usually stops on its own. Various things can cause bleeding or spotting in pregnancy. Some bleeding may be related to the pregnancy, and some may not. Sometimes the bleeding is normal  and is not a problem. However, bleeding can also be a sign of something serious. Be sure to tell your health care provider about any vaginal bleeding right away. Some possible causes of vaginal bleeding during the second trimester include:  Infection, inflammation, or growths on the cervix.   The placenta may be partially or completely covering the opening of the cervix inside the uterus (placenta previa).  The placenta may have separated from the uterus (abruption of the placenta).   You may be having early (preterm) labor.   The cervix may not be strong enough to keep a baby inside the uterus (cervical insufficiency).   Tiny cysts may have developed in the uterus instead of pregnancy tissue (molar pregnancy). HOME CARE INSTRUCTIONS  Watch your condition for any changes. The following actions may help to lessen any discomfort you are feeling:  Follow your health care provider's instructions for limiting your activity. If your health care provider orders bed rest, you may need to stay in bed and only get up to use the bathroom. However, your health care provider may allow you to continue light activity.  If needed, make plans for someone to help with your regular activities and responsibilities while you are on bed rest.  Keep track of the number of  pads you use each day, how often you change pads, and how soaked (saturated) they are. Write this down.  Do not use tampons. Do not douche.  Do not have sexual intercourse or orgasms until approved by your health care provider.  If you pass any tissue from your vagina, save the tissue so you can show it to your health care provider.  Only take over-the-counter or prescription medicines as directed by your health care provider.  Do not take aspirin because it can make you bleed.  Do not exercise or perform any strenuous activities or heavy lifting without your health care provider's permission.  Keep all follow-up appointments as directed by your health care provider. SEEK MEDICAL CARE IF:  You have any vaginal bleeding during any part of your pregnancy.  You have cramps or labor pains.  You have a fever, not controlled by medicine. SEEK IMMEDIATE MEDICAL CARE IF:   You have severe cramps in your back or belly (abdomen).  You have contractions.  You have chills.  You pass large clots or tissue from your vagina.  Your bleeding increases.  You feel light-headed or weak, or you have fainting episodes.  You are leaking fluid or have a gush of fluid from your vagina. MAKE SURE YOU:  Understand these instructions.  Will watch your condition.  Will get help right away if you are not doing well or get worse.   This information is not intended to replace advice given to you by your health care provider. Make sure you discuss any questions you have with your health care provider.   Document Released: 10/09/2004 Document Revised: 01/04/2013 Document Reviewed: 09/06/2012 Elsevier Interactive Patient Education [employer] Inc[employer]
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
## 2015-02-09 ENCOUNTER — Encounter: Payer: Self-pay | Admitting: *Deleted

## 2015-02-09 ENCOUNTER — Observation Stay
Admission: EM | Admit: 2015-02-09 | Discharge: 2015-02-09 | Disposition: A | Discharge status: Home / Self Care | Payer: Medicaid Other | Attending: Obstetrics and Gynecology | Admitting: Obstetrics and Gynecology

## 2015-02-09 DIAGNOSIS — O26899 Other specified pregnancy related conditions, unspecified trimester: Secondary | ICD-10-CM

## 2015-02-09 DIAGNOSIS — Z91018 Allergy to other foods: Secondary | ICD-10-CM | POA: Insufficient documentation

## 2015-02-09 DIAGNOSIS — J45909 Unspecified asthma, uncomplicated: Secondary | ICD-10-CM | POA: Insufficient documentation

## 2015-02-09 DIAGNOSIS — O24912 Unspecified diabetes mellitus in pregnancy, second trimester: Secondary | ICD-10-CM | POA: Diagnosis not present

## 2015-02-09 DIAGNOSIS — Z3A20 20 weeks gestation of pregnancy: Secondary | ICD-10-CM | POA: Insufficient documentation

## 2015-02-09 DIAGNOSIS — R109 Unspecified abdominal pain: Secondary | ICD-10-CM

## 2015-02-09 DIAGNOSIS — O99512 Diseases of the respiratory system complicating pregnancy, second trimester: Secondary | ICD-10-CM | POA: Insufficient documentation

## 2015-02-09 DIAGNOSIS — O10912 Unspecified pre-existing hypertension complicating pregnancy, second trimester: Principal | ICD-10-CM | POA: Insufficient documentation

## 2015-02-09 NOTE — Final Progress Note (Signed)
Obstetrics Admission History & Physical  02/09/2015 - 8:56 PM Primary OBGYN: [facility]  Chief Complaint: abdominal discomfort that has improved on it's own  History of Present Illness  25 y.o. G2P1001 @ 20/1 (Dating: EDC 6/15, LMP=5wk u/s), with the above CC. Pregnancy complicated by: cHTN, BMI 30, asthma, h/o CIN 1.  [name] states that she was visiting friends here at the hospital and noticed that she started to have some mid abdominal discomfort. Not similar to UCs and no dysuria, fevers, chills, decreased FM, VB or LOF but patient unable to exactly characterize it. Regardless, since being in [facility] triage, the discomfort has gone away  Review of Systems:  her 12 point review of systems is negative or as noted in the History of Present Illness.  PMHx:  Past Medical History  Diagnosis Date  . Asthma   . Hypertension   . Diabetes mellitus without complication (HCC)   . Panic attacks    PSHx: No past surgical history on file. Medications:  Prescriptions prior to admission  Medication Sig Dispense Refill Last Dose  . albuterol (PROVENTIL HFA;VENTOLIN HFA) 108 (90 BASE) MCG/ACT inhaler Inhale 2 puffs into the lungs every 6 (six) hours as needed for wheezing or shortness of breath.   PRN  . azithromycin (ZITHROMAX Z-PAK) 250 MG tablet Take 2 tablets (500 mg) on  Day 1,  followed by 1 tablet (250 mg) once daily on Days 2 through 5. (Patient not taking: Reported on 01/17/2015) 6 each 0 Not Taking at Unknown time  . budesonide-formoterol (SYMBICORT) 160-4.5 MCG/ACT inhaler Inhale 2 puffs into the lungs 2 (two) times daily as needed (shortness of breath, wheezing).   01/16/2015 at Unknown time  . diphenhydrAMINE (BENADRYL) 25 mg capsule Take 2 capsules (50 mg total) by mouth every 6 (six) hours as needed. 60 capsule 0   . guaiFENesin-codeine 100-10 MG/5ML syrup Take 10 mLs by mouth every 4 (four) hours as needed for cough. (Patient not taking: Reported on 01/17/2015) 180 mL 0 Not Taking  at Unknown time  . metoCLOPramide (REGLAN) 10 MG tablet Take 1 tablet (10 mg total) by mouth 4 (four) times daily -  before meals and at bedtime. (Patient not taking: Reported on 02/09/2015) 60 tablet 0 Not Taking at Unknown time  . promethazine (PHENERGAN) 25 MG tablet Take 1 tablet (25 mg total) by mouth every 6 (six) hours as needed for nausea or vomiting. (Patient not taking: Reported on 01/17/2015) 20 tablet 0 Not Taking at Unknown time     Allergies: is allergic to strawberry (diagnostic). OBHx:  OB History  Gravida Para Term Preterm AB SAB TAB Ectopic Multiple Living  2         1    # Outcome Date GA Lbr Len/2nd Weight Sex Delivery Anes PTL Lv  2 Current           1 Gravida 02/09/15    F Vag-Spont None Y Y      GYNHx:  History of abnormal pap smears: Yes.              FHx: No family history on file. Soc Hx:  Social History   Social History  . Marital Status: Married    Spouse Name: N/A  . Number of Children: N/A  . Years of Education: N/A   Occupational History  . Not on file.   Social History Main Topics  . Smoking status: Never Smoker   . Smokeless tobacco: Not on file  .  Alcohol Use: No  . Drug Use: No  . Sexual Activity: Yes   Other Topics Concern  . Not on file   Social History Narrative    Objective    Current Vital Signs 24h Vital Sign Ranges  T 98.1 F (36.7 C) Temp  Avg: 98.1 F (36.7 C)  Min: 98.1 F (36.7 C)  Max: 98.1 F (36.7 C)  BP 132/60 mmHg BP  Min: 125/70  Max: 132/60  HR 85 Pulse  Avg: 89.3  Min: 85  Max: 98  RR 20 Resp  Avg: 20  Min: 20  Max: 20  SaO2    98/RA No Data Recorded       24 Hour I/O Current Shift I/O  Time Ins Outs       EFM: dopplers normal  Toco: quiet  General: Well nourished, well developed female in no acute distress.  Skin:  Warm and dry.  Cardiovascular: Regular rate and rhythm. Respiratory:  Clear to auscultation bilateral. Normal respiratory effort Abdomen: gravid, nttp Back: no CVAT Neuro/Psych:   Normal mood and affect.   SVE: cl/long/hi  Labs  none  Radiology none  Assessment & Plan   25 y.o. G2P1001 @ 20/1 with likely normal pregnancy associated discomfort *IUP: fetal status reassuring. Negative PTL eval. Precautions given *Dispo: d/c to home  Cornelia Copa. MD [facility] Pager [phone]

## 2015-02-27 ENCOUNTER — Encounter: Payer: Self-pay | Admitting: *Deleted

## 2015-02-27 ENCOUNTER — Observation Stay
Admission: EM | Admit: 2015-02-27 | Discharge: 2015-02-27 | Disposition: A | Discharge status: Home / Self Care | Payer: Medicaid Other | Attending: Certified Nurse Midwife | Admitting: Certified Nurse Midwife

## 2015-02-27 DIAGNOSIS — R1031 Right lower quadrant pain: Secondary | ICD-10-CM

## 2015-02-27 DIAGNOSIS — O26892 Other specified pregnancy related conditions, second trimester: Secondary | ICD-10-CM | POA: Diagnosis present

## 2015-02-27 DIAGNOSIS — R1032 Left lower quadrant pain: Secondary | ICD-10-CM | POA: Insufficient documentation

## 2015-02-27 DIAGNOSIS — Z3A22 22 weeks gestation of pregnancy: Secondary | ICD-10-CM | POA: Diagnosis not present

## 2015-02-27 HISTORY — DX: Right lower quadrant pain: R10.31

## 2015-02-27 LAB — URINALYSIS COMPLETE WITH MICROSCOPIC (ARMC ONLY)
Bilirubin Urine: NEGATIVE
Glucose, UA: NEGATIVE mg/dL
Hgb urine dipstick: NEGATIVE
Leukocytes, UA: NEGATIVE
Nitrite: NEGATIVE
PROTEIN: NEGATIVE mg/dL
RBC / HPF: NONE SEEN RBC/hpf (ref 0–5)
SPECIFIC GRAVITY, URINE: 1.01 (ref 1.005–1.030)
pH: 7 (ref 5.0–8.0)

## 2015-02-27 LAB — CHLAMYDIA/NGC RT PCR (ARMC ONLY)
CHLAMYDIA TR: NOT DETECTED
N GONORRHOEAE: NOT DETECTED
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
## 2015-02-27 NOTE — Discharge Summary (Signed)
Reviewed discharge instructions with patient and significant other, both verbalized understanding. Reviewed decreased fetal movement, leaking fluid, vaginal bleeding, and worsening abdominal pain. Copy of instructions given to patient. Stable and ambulatory upon discharge.

## 2015-02-27 NOTE — Discharge Instructions (Signed)
Call MD office for worsening pain, leaking of fluid, decrease in fetal movement, or vaginal bleeding.

## 2015-03-02 ENCOUNTER — Observation Stay
Admission: EM | Admit: 2015-03-02 | Discharge: 2015-03-02 | Disposition: A | Discharge status: Home / Self Care | Payer: Medicaid Other | Attending: Obstetrics and Gynecology | Admitting: Obstetrics and Gynecology

## 2015-03-02 ENCOUNTER — Observation Stay: Payer: Medicaid Other

## 2015-03-02 DIAGNOSIS — E119 Type 2 diabetes mellitus without complications: Secondary | ICD-10-CM | POA: Diagnosis not present

## 2015-03-02 DIAGNOSIS — O99512 Diseases of the respiratory system complicating pregnancy, second trimester: Secondary | ICD-10-CM | POA: Insufficient documentation

## 2015-03-02 DIAGNOSIS — G8929 Other chronic pain: Secondary | ICD-10-CM | POA: Insufficient documentation

## 2015-03-02 DIAGNOSIS — D72829 Elevated white blood cell count, unspecified: Secondary | ICD-10-CM | POA: Insufficient documentation

## 2015-03-02 DIAGNOSIS — J45909 Unspecified asthma, uncomplicated: Secondary | ICD-10-CM | POA: Diagnosis not present

## 2015-03-02 DIAGNOSIS — Z3A23 23 weeks gestation of pregnancy: Secondary | ICD-10-CM | POA: Diagnosis not present

## 2015-03-02 DIAGNOSIS — R109 Unspecified abdominal pain: Secondary | ICD-10-CM | POA: Diagnosis not present

## 2015-03-02 DIAGNOSIS — O10912 Unspecified pre-existing hypertension complicating pregnancy, second trimester: Secondary | ICD-10-CM | POA: Insufficient documentation

## 2015-03-02 DIAGNOSIS — Z9102 Food additives allergy status: Secondary | ICD-10-CM | POA: Insufficient documentation

## 2015-03-02 DIAGNOSIS — O26892 Other specified pregnancy related conditions, second trimester: Secondary | ICD-10-CM | POA: Diagnosis not present

## 2015-03-02 DIAGNOSIS — O24112 Pre-existing diabetes mellitus, type 2, in pregnancy, second trimester: Secondary | ICD-10-CM | POA: Diagnosis not present

## 2015-03-02 DIAGNOSIS — O26899 Other specified pregnancy related conditions, unspecified trimester: Secondary | ICD-10-CM

## 2015-03-02 LAB — URINALYSIS COMPLETE WITH MICROSCOPIC (ARMC ONLY)
BILIRUBIN URINE: NEGATIVE
Glucose, UA: NEGATIVE mg/dL
Hgb urine dipstick: NEGATIVE
Leukocytes, UA: NEGATIVE
Nitrite: NEGATIVE
PH: 6 (ref 5.0–8.0)
PROTEIN: 30 mg/dL — AB
Specific Gravity, Urine: 1.028 (ref 1.005–1.030)

## 2015-03-02 LAB — COMPREHENSIVE METABOLIC PANEL
ALBUMIN: 3 g/dL — AB (ref 3.5–5.0)
ALT: 23 U/L (ref 14–54)
AST: 24 U/L (ref 15–41)
Alkaline Phosphatase: 50 U/L (ref 38–126)
Anion gap: 8 (ref 5–15)
BILIRUBIN TOTAL: 0.5 mg/dL (ref 0.3–1.2)
BUN: 9 mg/dL (ref 6–20)
CO2: 22 mmol/L (ref 22–32)
CREATININE: 0.51 mg/dL (ref 0.44–1.00)
Calcium: 9.1 mg/dL (ref 8.9–10.3)
Chloride: 107 mmol/L (ref 101–111)
GFR calc Af Amer: >60 mL/min (ref >60)
GLUCOSE: 83 mg/dL (ref 65–99)
POTASSIUM: 3.4 mmol/L — AB (ref 3.5–5.1)
Sodium: 137 mmol/L (ref 135–145)
TOTAL PROTEIN: 6.9 g/dL (ref 6.5–8.1)

## 2015-03-02 LAB — CBC
HEMATOCRIT: 33.7 % — AB (ref 35.0–47.0)
HEMOGLOBIN: 11.5 g/dL — AB (ref 12.0–16.0)
MCH: 29.4 pg (ref 26.0–34.0)
MCHC: 34.1 g/dL (ref 32.0–36.0)
MCV: 86.1 fL (ref 80.0–100.0)
Platelets: 253 10*3/uL (ref 150–440)
RBC: 3.91 MIL/uL (ref 3.80–5.20)
RDW: 14 % (ref 11.5–14.5)
WBC: 14.9 10*3/uL — ABNORMAL HIGH (ref 3.6–11.0)

## 2015-03-02 LAB — LIPASE, BLOOD: LIPASE: 17 U/L (ref 11–51)
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
## 2015-03-02 NOTE — Progress Notes (Signed)
Pt returned to room from MRI, asked if she could have something to eat, explained to pt that would hold eating until results of MRI obtained and reviewed by MD, will update pt after speaking with MD about her eating. Pt denies any abd or back pain at this time.

## 2015-03-02 NOTE — Progress Notes (Signed)
Shift report received from K. Ophelia Charter, RN. Pt lying awake in bed with s/o present at side. Dr Bonney Aid present at bedside discussing poc with pt and s/o. Would like urine sample and will order labs to be completed. U/S removed per MD approval since difficulty obtaining tracing given pt gestational age.

## 2015-03-02 NOTE — Final Progress Note (Signed)
Physician Final Progress Note  Patient ID: Julia Cherry MRN: [id number] DOB/AGE: 1990/12/09 25 y.o.  Admit date: 03/02/2015 Admitting provider: Vena Austria, MD Discharge date: 03/02/2015   Admission Diagnoses: Abdominal pain in pregnancy  Discharge Diagnoses:  Active Problems:   Abdominal pain affecting pregnancy, antepartum  Patient is a 25 year old G2P1001 presenting with abdominal pain via EMS.  Labs obtained and normal other than mildly elevated WBC likely regular leukocytosis of pregnancy.  However, given second presentation MRI abdomen and pelvis obtained to rule out appendicitis with normal appendix visualized on this study.  Abdominal pain improved throughout course of admission.  No evidence of preterm labor with cervix closed on admission and on recheck at discharge 4-hrs later.    Consults: None  Significant Findings/ Diagnostic Studies:  Results for orders placed or performed during the hospital encounter of 03/02/15 (from the past 24 hour(s))  Comprehensive metabolic panel     Status: Abnormal   Collection Time: 03/02/15  7:06 PM  Result Value Ref Range   Sodium 137 135 - 145 mmol/L   Potassium 3.4 (L) 3.5 - 5.1 mmol/L   Chloride 107 101 - 111 mmol/L   CO2 22 22 - 32 mmol/L   Glucose, Bld 83 65 - 99 mg/dL   BUN 9 6 - 20 mg/dL   Creatinine, Ser 1.19 0.44 - 1.00 mg/dL   Calcium 9.1 8.9 - 14.7 mg/dL   Total Protein 6.9 6.5 - 8.1 g/dL   Albumin 3.0 (L) 3.5 - 5.0 g/dL   AST 24 15 - 41 U/L   ALT 23 14 - 54 U/L   Alkaline Phosphatase 50 38 - 126 U/L   Total Bilirubin 0.5 0.3 - 1.2 mg/dL   GFR calc non Af Amer >60 >60 mL/min   GFR calc Af Amer >60 >60 mL/min   Anion gap 8 5 - 15  Urinalysis complete, with microscopic ([facility] only)     Status: Abnormal   Collection Time: 03/02/15  7:06 PM  Result Value Ref Range   Color, Urine AMBER (A) YELLOW   APPearance HAZY (A) CLEAR   Glucose, UA NEGATIVE NEGATIVE mg/dL   Bilirubin Urine NEGATIVE NEGATIVE    Ketones, ur 2+ (A) NEGATIVE mg/dL   Specific Gravity, Urine 1.028 1.005 - 1.030   Hgb urine dipstick NEGATIVE NEGATIVE   pH 6.0 5.0 - 8.0   Protein, ur 30 (A) NEGATIVE mg/dL   Nitrite NEGATIVE NEGATIVE   Leukocytes, UA NEGATIVE NEGATIVE   RBC / HPF 0-5 0 - 5 RBC/hpf   WBC, UA 0-5 0 - 5 WBC/hpf   Bacteria, UA RARE (A) NONE SEEN   Squamous Epithelial / LPF 0-5 (A) NONE SEEN   Mucous PRESENT   CBC     Status: Abnormal   Collection Time: 03/02/15  7:06 PM  Result Value Ref Range   WBC 14.9 (H) 3.6 - 11.0 K/uL   RBC 3.91 3.80 - 5.20 MIL/uL   Hemoglobin 11.5 (L) 12.0 - 16.0 g/dL   HCT 82.9 (L) 56.2 - 13.0 %   MCV 86.1 80.0 - 100.0 fL   MCH 29.4 26.0 - 34.0 pg   MCHC 34.1 32.0 - 36.0 g/dL   RDW 86.5 78.4 - 69.6 %   Platelets 253 150 - 440 K/uL  Lipase, blood     Status: None   Collection Time: 03/02/15  7:06 PM  Result Value Ref Range   Lipase 17 11 - 51 U/L  Mr Pelvis Wo Contrast  03/02/2015  CLINICAL DATA:  Generalize chronic abdominal pain with elevated white cell count. Twenty-three weeks pregnant. EXAM: MRI ABDOMEN WITHOUT CONTRAST TECHNIQUE: Multiplanar multisequence MR imaging was performed without the administration of intravenous contrast. COMPARISON:  CT abdomen and pelvis 04/01/2012 FINDINGS: Intrauterine pregnancy with fetus in breech presentation. Placenta is posterior. The appendix is visualized medial to the cecum and lateral to the uterus. Normal appendiceal diameter. No periappendiceal infiltration or fluid. No findings to suggest acute appendicitis. Both ovaries are visualized and appear unremarkable with normal follicular changes present. No free or loculated pelvic fluid collections. Bladder is decompressed. Visualized portions of the liver, spleen, gallbladder, kidneys, and pancreas are grossly unremarkable. No bile duct dilatation. Visualized small and large bowel are not distended. IMPRESSION: Normal appendix is identified.  No evidence of acute appendicitis.  Electronically Signed   By: Burman Nieves M.D.   On: 03/02/2015 22:24   Mr Abdomen Wo Contrast  03/02/2015  CLINICAL DATA:  Generalize chronic abdominal pain with elevated white cell count. Twenty-three weeks pregnant. EXAM: MRI ABDOMEN WITHOUT CONTRAST TECHNIQUE: Multiplanar multisequence MR imaging was performed without the administration of intravenous contrast. COMPARISON:  CT abdomen and pelvis 04/01/2012 FINDINGS: Intrauterine pregnancy with fetus in breech presentation. Placenta is posterior. The appendix is visualized medial to the cecum and lateral to the uterus. Normal appendiceal diameter. No periappendiceal infiltration or fluid. No findings to suggest acute appendicitis. Both ovaries are visualized and appear unremarkable with normal follicular changes present. No free or loculated pelvic fluid collections. Bladder is decompressed. Visualized portions of the liver, spleen, gallbladder, kidneys, and pancreas are grossly unremarkable. No bile duct dilatation. Visualized small and large bowel are not distended. IMPRESSION: Normal appendix is identified.  No evidence of acute appendicitis. Electronically Signed   By: Burman Nieves M.D.   On: 03/02/2015 22:24    Procedures: MRI abdomen and pelvis  Discharge Condition: stable  Disposition: 79-Discharged to home/self-care with a planned acute care hospital inpt readmission  Diet: Regular diet  Discharge Activity: Activity as tolerated  Discharge Instructions    Discharge activity:  No Restrictions    Complete by:  As directed      Discharge diet:  No restrictions    Complete by:  As directed      No sexual activity restrictions    Complete by:  As directed      Notify physician for a general feeling that "something is not right"    Complete by:  As directed      Notify physician for increase or change in vaginal discharge    Complete by:  As directed      Notify physician for intestinal cramps, with or without diarrhea,  sometimes described as "gas pain"    Complete by:  As directed      Notify physician for leaking of fluid    Complete by:  As directed      Notify physician for low, dull backache, unrelieved by heat or Tylenol    Complete by:  As directed      Notify physician for menstrual like cramps    Complete by:  As directed      Notify physician for pelvic pressure    Complete by:  As directed      Notify physician for uterine contractions.  These may be painless and feel like the uterus is tightening or the baby is  "balling up"    Complete by:  As directed      Notify physician for vaginal bleeding  Complete by:  As directed      PRETERM LABOR:  Includes any of the follwing symptoms that occur between 20 - [redacted] weeks gestation.  If these symptoms are not stopped, preterm labor can result in preterm delivery, placing your baby at risk    Complete by:  As directed             Medication List    TAKE these medications        albuterol 108 (90 Base) MCG/ACT inhaler  Commonly known as:  PROVENTIL HFA;VENTOLIN HFA  Inhale 2 puffs into the lungs every 6 (six) hours as needed for wheezing or shortness of breath.     beclomethasone 80 MCG/ACT inhaler  Commonly known as:  QVAR  Inhale 2 puffs into the lungs 2 (two) times daily.     budesonide 180 MCG/ACT inhaler  Commonly known as:  PULMICORT  Inhale 2 puffs into the lungs 2 (two) times daily.     diphenhydrAMINE 25 mg capsule  Commonly known as:  BENADRYL  Take 2 capsules (50 mg total) by mouth every 6 (six) hours as needed.     montelukast 10 MG tablet  Commonly known as:  SINGULAIR  Take 10 mg by mouth at bedtime.     multivitamin-prenatal 27-0.8 MG Tabs tablet  Take 1 tablet by mouth daily at 12 noon.           Follow-up Information    Follow up with Lorrene Reid, MD In 1 week.   Specialty:  Obstetrics and Gynecology   Why:  As needed   Contact information:   [address] [place] [address] [phone]        Total time spent taking care of this patient: 60 minutes  Signed: Lorrene Reid 03/02/2015, 10:43 PM2

## 2015-03-02 NOTE — OB Triage Note (Signed)
Call OB clinic when next open to scheduled 1 week post hospital triage visit follow-up appointment. May take otc Tylenol as labelled for pain when needed. Preterm labor precautions discussed with pt and s/o. Encouraged rest, po hydration, avoid strenuous activity, keep all OB clinic appointments.

## 2015-03-02 NOTE — Progress Notes (Signed)
PIV d'ced. Pt denies any pain. VSS. FHT normal. Cervix unchanged during triage visit, remains closed. MD reviewed labs and MRI results. Discharge teaching with preterm labor precautions given to pt. Reviewed discharge paperwork including handouts with pt and s/o. Encouraged pt to call to schedule an appointment on Monday for f/u with OB next week. Pt and s/o agree with plan for discharge home.

## 2015-03-02 NOTE — Discharge Instructions (Signed)

## 2015-03-02 NOTE — Progress Notes (Signed)
Pt leaving unit via WC, accompanied by RN to MRI dept for procedure.

## 2015-03-02 NOTE — OB Triage Note (Signed)
Ms. Salem here with c/o ctx since 1630, increasing in strength and frequency. Arrived by EMS. After applying monitors and obtaining fetal heart tones, asked pt when she felt last contraction, she stated "before I got in the bed"

## 2015-03-02 NOTE — Progress Notes (Signed)
Results for orders placed or performed during the hospital encounter of 03/02/15 (from the past 24 hour(s))  Comprehensive metabolic panel     Status: Abnormal   Collection Time: 03/02/15  7:06 PM  Result Value Ref Range   Sodium 137 135 - 145 mmol/L   Potassium 3.4 (L) 3.5 - 5.1 mmol/L   Chloride 107 101 - 111 mmol/L   CO2 22 22 - 32 mmol/L   Glucose, Bld 83 65 - 99 mg/dL   BUN 9 6 - 20 mg/dL   Creatinine, Ser 1.61 0.44 - 1.00 mg/dL   Calcium 9.1 8.9 - 09.6 mg/dL   Total Protein 6.9 6.5 - 8.1 g/dL   Albumin 3.0 (L) 3.5 - 5.0 g/dL   AST 24 15 - 41 U/L   ALT 23 14 - 54 U/L   Alkaline Phosphatase 50 38 - 126 U/L   Total Bilirubin 0.5 0.3 - 1.2 mg/dL   GFR calc non Af Amer >60 >60 mL/min   GFR calc Af Amer >60 >60 mL/min   Anion gap 8 5 - 15  Urinalysis complete, with microscopic ([facility] only)     Status: Abnormal   Collection Time: 03/02/15  7:06 PM  Result Value Ref Range   Color, Urine AMBER (A) YELLOW   APPearance HAZY (A) CLEAR   Glucose, UA NEGATIVE NEGATIVE mg/dL   Bilirubin Urine NEGATIVE NEGATIVE   Ketones, ur 2+ (A) NEGATIVE mg/dL   Specific Gravity, Urine 1.028 1.005 - 1.030   Hgb urine dipstick NEGATIVE NEGATIVE   pH 6.0 5.0 - 8.0   Protein, ur 30 (A) NEGATIVE mg/dL   Nitrite NEGATIVE NEGATIVE   Leukocytes, UA NEGATIVE NEGATIVE   RBC / HPF 0-5 0 - 5 RBC/hpf   WBC, UA 0-5 0 - 5 WBC/hpf   Bacteria, UA RARE (A) NONE SEEN   Squamous Epithelial / LPF 0-5 (A) NONE SEEN   Mucous PRESENT   CBC     Status: Abnormal   Collection Time: 03/02/15  7:06 PM  Result Value Ref Range   WBC 14.9 (H) 3.6 - 11.0 K/uL   RBC 3.91 3.80 - 5.20 MIL/uL   Hemoglobin 11.5 (L) 12.0 - 16.0 g/dL   HCT 04.5 (L) 40.9 - 81.1 %   MCV 86.1 80.0 - 100.0 fL   MCH 29.4 26.0 - 34.0 pg   MCHC 34.1 32.0 - 36.0 g/dL   RDW 91.4 78.2 - 95.6 %   Platelets 253 150 - 440 K/uL  Lipase, blood     Status: None   Collection Time: 03/02/15  7:06 PM  Result Value Ref Range   Lipase 17 11 - 51 U/L    Called  out asking for pain medications, reviewed labs given elevation in white count will rule out appendicitis.  When I went to see patient she states feeling somewhat better but feels pain is associated with work.  She has previously asked for work note.  Discussed if normal work up I have no indication at present to keep her out of work

## 2015-03-02 NOTE — Progress Notes (Signed)
Pt. Requesting something to eat, encouraged patient to wait on MRI results; nothing given.

## 2015-03-02 NOTE — Progress Notes (Signed)
Spoke with Dr Bonney Aid, pt reports worsening abdominal pain and tightening. Requesting something to ease pain that comes and goes, repositioned pt and adjusted toco. Reviewed lab results with MD. Will order MRI, hold any pain med at this time.

## 2015-03-12 ENCOUNTER — Emergency Department
Admission: EM | Admit: 2015-03-12 | Discharge: 2015-03-12 | Disposition: A | Discharge status: Home / Self Care | Payer: Medicaid Other | Attending: Emergency Medicine | Admitting: Emergency Medicine

## 2015-03-12 ENCOUNTER — Encounter: Payer: Self-pay | Admitting: Emergency Medicine

## 2015-03-12 DIAGNOSIS — O10012 Pre-existing essential hypertension complicating pregnancy, second trimester: Secondary | ICD-10-CM | POA: Diagnosis not present

## 2015-03-12 DIAGNOSIS — J4521 Mild intermittent asthma with (acute) exacerbation: Secondary | ICD-10-CM | POA: Diagnosis not present

## 2015-03-12 DIAGNOSIS — O98512 Other viral diseases complicating pregnancy, second trimester: Secondary | ICD-10-CM | POA: Diagnosis not present

## 2015-03-12 DIAGNOSIS — J4520 Mild intermittent asthma, uncomplicated: Secondary | ICD-10-CM

## 2015-03-12 DIAGNOSIS — Z3A26 26 weeks gestation of pregnancy: Secondary | ICD-10-CM | POA: Insufficient documentation

## 2015-03-12 DIAGNOSIS — B349 Viral infection, unspecified: Secondary | ICD-10-CM | POA: Insufficient documentation

## 2015-03-12 DIAGNOSIS — Z79899 Other long term (current) drug therapy: Secondary | ICD-10-CM | POA: Diagnosis not present

## 2015-03-12 DIAGNOSIS — O99512 Diseases of the respiratory system complicating pregnancy, second trimester: Secondary | ICD-10-CM | POA: Diagnosis present

## 2015-03-12 DIAGNOSIS — O24112 Pre-existing diabetes mellitus, type 2, in pregnancy, second trimester: Secondary | ICD-10-CM | POA: Insufficient documentation

## 2015-03-12 DIAGNOSIS — Z7951 Long term (current) use of inhaled steroids: Secondary | ICD-10-CM | POA: Insufficient documentation

## 2015-03-12 LAB — RAPID INFLUENZA A&B ANTIGENS: Influenza B (ARMC): NOT DETECTED

## 2015-03-12 LAB — RAPID INFLUENZA A&B ANTIGENS (ARMC ONLY): INFLUENZA A (ARMC): NOT DETECTED

## 2015-03-12 MED ORDER — ALBUTEROL SULFATE (2.5 MG/3ML) 0.083% IN NEBU
2.5000 mg | INHALATION_SOLUTION | Freq: Once | RESPIRATORY_TRACT | Status: AC
  Administered 2015-03-12: 2.5 mg via RESPIRATORY_TRACT
  Filled 2015-03-12: qty 3

## 2015-03-12 MED ORDER — ACETAMINOPHEN 325 MG PO TABS
650.0000 mg | ORAL_TABLET | Freq: Once | ORAL | Status: AC
  Administered 2015-03-12: 650 mg via ORAL
  Filled 2015-03-12: qty 2

## 2015-03-12 NOTE — ED Notes (Signed)
States she is 26 weeks preg and having some chest congestion with cough.. Had some fever last pm but afebrile at present .Marland Kitchen Has been using sudafed w/o relief.  Denies any vaginal bleeding or pregnancy problems

## 2015-03-12 NOTE — ED Notes (Signed)
Cough since yesterday, fever 101 yesterday.

## 2015-03-12 NOTE — Discharge Instructions (Signed)
Viral Infections °A viral infection can be caused by different types of viruses. Most viral infections are not serious and resolve on their own. However, some infections may cause severe symptoms and may lead to further complications. °SYMPTOMS °Viruses can frequently cause: °· Minor sore throat. °· Aches and pains. °· Headaches. °· Runny nose. °· Different types of rashes. °· Watery eyes. °· Tiredness. °· Cough. °· Loss of appetite. °· Gastrointestinal infections, resulting in nausea, vomiting, and diarrhea. °These symptoms do not respond to antibiotics because the infection is not caused by bacteria. However, you might catch a bacterial infection following the viral infection. This is sometimes called a "superinfection." Symptoms of such a bacterial infection may include: °· Worsening sore throat with pus and difficulty swallowing. °· Swollen neck glands. °· Chills and a high or persistent fever. °· Severe headache. °· Tenderness over the sinuses. °· Persistent overall ill feeling (malaise), muscle aches, and tiredness (fatigue). °· Persistent cough. °· Yellow, green, or brown mucus production with coughing. °HOME CARE INSTRUCTIONS  °· Only take over-the-counter or prescription medicines for pain, discomfort, diarrhea, or fever as directed by your caregiver. °· Drink enough water and fluids to keep your urine clear or pale yellow. Sports drinks can provide valuable electrolytes, sugars, and hydration. °· Get plenty of rest and maintain proper nutrition. Soups and broths with crackers or rice are fine. °SEEK IMMEDIATE MEDICAL CARE IF:  °· You have severe headaches, shortness of breath, chest pain, neck pain, or an unusual rash. °· You have uncontrolled vomiting, diarrhea, or you are unable to keep down fluids. °· You or your child has an oral temperature above 102° F (38.9° C), not controlled by medicine. °· Your baby is older than 3 months with a rectal temperature of 102° F (38.9° C) or higher. °· Your baby is 3  months old or younger with a rectal temperature of 100.4° F (38° C) or higher. °MAKE SURE YOU:  °· Understand these instructions. °· Will watch your condition. °· Will get help right away if you are not doing well or get worse. °  °This information is not intended to replace advice given to you by your health care provider. Make sure you discuss any questions you have with your health care provider. °  °Document Released: 10/09/2004 Document Revised: 03/24/2011 Document Reviewed: 06/07/2014 °Elsevier Interactive Patient Education ©2016 [employer] ° °

## 2015-03-12 NOTE — ED Provider Notes (Signed)
[facility] Emergency Department Provider Note  ____________________________________________  Time seen: Approximately 10:05 AM  I have reviewed the triage vital signs and the nursing notes.   HISTORY  Chief Complaint Cough    HPI Julia Cherry is a 25 y.o. female, [redacted] weeks pregnant, who presents with fever and cough since yesterday. She had a reported fever of 101, SOB, wheezing, chills, diarrhea, vomiting, and a productive cough with yellow sputum production. She also experienced dizziness and saw black spots early this morning. Other associated symptoms include rhinorrhea, congestion, dyspnea on exertion and sharp upper chest pain and tightness. She denies sweats, earache, stiff neck, sore throat, and hemoptysis. The patient has a history of asthma and states that her current cough makes her feel as if she is having asthma attack. She has used Albuterol, Singulair, Qvar, and Sudafed for the cough with minimal benefit. She did notice improvement in the fever with taking Tylenol. The patient notes that her symptoms are worse in the morning and at night. She ranks the severity of her symptoms to be 5-6/10.  Past Medical History  Diagnosis Date  . Asthma     Qvar daily  . Hypertension   . Diabetes mellitus without complication (HCC)   . Panic attacks     Patient Active Problem List   Diagnosis Date Noted  . Bilateral lower abdominal pain 02/27/2015  . Abdominal pain affecting pregnancy, antepartum 02/09/2015    Past Surgical History  Procedure Laterality Date  . Denies sx hx      Current Outpatient Rx  Name  Route  Sig  Dispense  Refill  . albuterol (PROVENTIL HFA;VENTOLIN HFA) 108 (90 BASE) MCG/ACT inhaler   Inhalation   Inhale 2 puffs into the lungs every 6 (six) hours as needed for wheezing or shortness of breath.         . beclomethasone (QVAR) 80 MCG/ACT inhaler   Inhalation   Inhale 2 puffs into the lungs 2 (two) times daily.          . budesonide (PULMICORT) 180 MCG/ACT inhaler   Inhalation   Inhale 2 puffs into the lungs 2 (two) times daily.         . diphenhydrAMINE (BENADRYL) 25 mg capsule   Oral   Take 2 capsules (50 mg total) by mouth every 6 (six) hours as needed.   60 capsule   0   . montelukast (SINGULAIR) 10 MG tablet   Oral   Take 10 mg by mouth at bedtime.         . Prenatal Vit-Fe Fumarate-FA (MULTIVITAMIN-PRENATAL) 27-0.8 MG TABS tablet   Oral   Take 1 tablet by mouth daily at 12 noon.           Allergies Strawberry (diagnostic)  No family history on file.  Social History Social History  Substance Use Topics  . Smoking status: Never Smoker   . Smokeless tobacco: None  . Alcohol Use: No    Review of Systems Constitutional: Fever/chills, headache. No sweats Eyes: Black spots. No redness, irritation, tearing. ENT: No sore throat. No stiffness of neck.  Cardiovascular: Chest pain. No palpitations, edema. Respiratory: Shortness of breath, dyspnea on exertion.  Gastrointestinal: Abdominal pain, nausea, vomiting, diarrhea.   Musculoskeletal: No myalgias Skin: Negative for rash. Neurological: Negative for focal weakness or numbness. 10-point ROS otherwise negative.  ____________________________________________   PHYSICAL EXAM:  VITAL SIGNS: ED Triage Vitals  Enc Vitals Group     BP 03/12/15 0909 125/63 mmHg  Pulse Rate 03/12/15 0909 84     Resp 03/12/15 0909 18     Temp 03/12/15 0909 98.1 F (36.7 C)     Temp Source 03/12/15 0909 Oral     SpO2 03/12/15 0909 97 %     Weight 03/12/15 0909 170 lb (77.111 kg)     Height 03/12/15 0909  (1.575 m)     Head Cir --      Peak Flow --      Pain Score 03/12/15 0910 7     Pain Loc --      Pain Edu? --      Excl. in GC? --     Constitutional: Alert and oriented. In no acute distress. Eyes: Conjunctivae are normal. PERRL. EOMI. Head: Atraumatic. Nose: No congestion/rhinnorhea. Mouth/Throat: Mucous membranes are  moist.  Oropharynx non-erythematous. Hematological/Lymphatic/Immunilogical: No cervical lymphadenopathy. Cardiovascular: Normal rate, regular rhythm. Grossly normal heart sounds.   Respiratory: Normal respiratory effort.  No retractions. No rales, rhonchi, or wheezing bilaterally.  Gastrointestinal: Soft and nontender. No distention. No abdominal bruits.  Neurologic:  Normal speech and language. No gross focal neurologic deficits are appreciated.  Skin:  Skin is warm, dry and intact. No rash noted. Psychiatric: Mood and affect are normal. Speech and behavior are normal.  ____________________________________________   LABS (all labs ordered are listed, but only abnormal results are displayed)  Labs Reviewed  RAPID INFLUENZA A&B ANTIGENS ([facility] ONLY)   ____________________________________________  EKG   ____________________________________________  RADIOLOGY   ____________________________________________   PROCEDURES  Procedure(s) performed: None  Critical Care performed: No  ____________________________________________   INITIAL IMPRESSION / ASSESSMENT AND PLAN / ED COURSE  Pertinent labs & imaging results that were available during my care of the patient were reviewed by me and considered in my medical decision making (see chart for details).  She is diagnosis consistent with viral upper respiratory infection. Patient is pregnant at this time and is unable to take over-the-counter medications for symptom control. She will take Tylenol and continue to use her asthma inhaler as needed. Patient will follow-up with OB/GYN or primary care provider for any symptoms persisting past this treatment course. ____________________________________________   FINAL CLINICAL IMPRESSION(S) / ED DIAGNOSES  Final diagnoses:  Viral illness  Asthma, mild intermittent, uncomplicated      Racheal Patches, PA-C 03/12/15 1158  Sharman Cheek, MD 03/12/15 1523

## 2015-03-13 ENCOUNTER — Encounter: Payer: Self-pay | Admitting: Obstetrics and Gynecology

## 2015-03-30 ENCOUNTER — Observation Stay
Admission: EM | Admit: 2015-03-30 | Discharge: 2015-03-30 | Disposition: A | Discharge status: Home / Self Care | Payer: Medicaid Other | Attending: Obstetrics and Gynecology | Admitting: Obstetrics and Gynecology

## 2015-03-30 DIAGNOSIS — O10913 Unspecified pre-existing hypertension complicating pregnancy, third trimester: Secondary | ICD-10-CM | POA: Diagnosis not present

## 2015-03-30 DIAGNOSIS — O99343 Other mental disorders complicating pregnancy, third trimester: Secondary | ICD-10-CM | POA: Diagnosis not present

## 2015-03-30 DIAGNOSIS — Z91018 Allergy to other foods: Secondary | ICD-10-CM | POA: Insufficient documentation

## 2015-03-30 DIAGNOSIS — O4703 False labor before 37 completed weeks of gestation, third trimester: Principal | ICD-10-CM | POA: Insufficient documentation

## 2015-03-30 DIAGNOSIS — J45909 Unspecified asthma, uncomplicated: Secondary | ICD-10-CM | POA: Diagnosis not present

## 2015-03-30 DIAGNOSIS — F410 Panic disorder [episodic paroxysmal anxiety] without agoraphobia: Secondary | ICD-10-CM | POA: Diagnosis not present

## 2015-03-30 DIAGNOSIS — O99891 Other specified diseases and conditions complicating pregnancy: Secondary | ICD-10-CM | POA: Diagnosis present

## 2015-03-30 DIAGNOSIS — Z3A28 28 weeks gestation of pregnancy: Secondary | ICD-10-CM | POA: Insufficient documentation

## 2015-03-30 DIAGNOSIS — O9989 Other specified diseases and conditions complicating pregnancy, childbirth and the puerperium: Secondary | ICD-10-CM

## 2015-03-30 DIAGNOSIS — O99513 Diseases of the respiratory system complicating pregnancy, third trimester: Secondary | ICD-10-CM | POA: Diagnosis not present

## 2015-03-30 DIAGNOSIS — M549 Dorsalgia, unspecified: Secondary | ICD-10-CM | POA: Diagnosis present

## 2015-03-30 LAB — URINALYSIS COMPLETE WITH MICROSCOPIC (ARMC ONLY)
BILIRUBIN URINE: NEGATIVE
GLUCOSE, UA: NEGATIVE mg/dL
Hgb urine dipstick: NEGATIVE
KETONES UR: NEGATIVE mg/dL
Leukocytes, UA: NEGATIVE
Nitrite: NEGATIVE
PH: 6 (ref 5.0–8.0)
Protein, ur: NEGATIVE mg/dL
Specific Gravity, Urine: 1.025 (ref 1.005–1.030)

## 2015-03-30 MED ORDER — CYCLOBENZAPRINE HCL 10 MG PO TABS
10.0000 mg | ORAL_TABLET | Freq: Once | ORAL | Status: AC
  Administered 2015-03-30: 10 mg via ORAL

## 2015-03-30 MED ORDER — ACETAMINOPHEN 325 MG PO TABS
650.0000 mg | ORAL_TABLET | ORAL | Status: DC | PRN
Start: 2015-03-30 — End: 2015-03-30

## 2015-03-30 MED ORDER — CYCLOBENZAPRINE HCL 10 MG PO TABS
ORAL_TABLET | ORAL | Status: AC
  Administered 2015-03-30: 10 mg via ORAL
  Filled 2015-03-30: qty 1

## 2015-03-30 NOTE — Discharge Summary (Signed)
S: Pt feeling better after taking Flexeril dose  O: VSS, category 1 tracing, no ctx per toco  A: IUP at 28 wks, no evidence of PTL, MSK back pain  P: Discharge home, Rx for Flexeril to use sparingly. Discussed pain relief of back pain.

## 2015-03-30 NOTE — Discharge Summary (Signed)
Discharge instructions reviewed with patient including follow up appointments, back pain in pregnancy, preterm labor precautions, and when to seek medical attention. All questions answered. Patient discharged home, ambulatory with steady gait, escorted by significant other.

## 2015-03-30 NOTE — OB Triage Provider Note (Signed)
Obstetric History and Physical  Julia Cherry is a 25 y.o. G2P1001 with Estimated Date of Delivery: 06/18/15 per LMP & 5 wk [place] who presents at [redacted]w[redacted]d  presenting for back pain x 3 days and c/o contractions this am.  Patient states she has been having q 6 min contractions, no vaginal bleeding, intact membranes, with active fetal movement.  Reports losing mucous plug. Pt with prior triage visits on 2/14 and 2/17 for abdominal pain  With negative workup including MRI.   Prenatal Course Source of Care: [facility]  with onset of care at 8 weeks Pregnancy complications or risks: -Asthma - on multiple agents -Episodes of hyperglycemia -BMI 30 with normal early 1 hour -CHTN, no meds -Episode of severe headache in early pregnancy with neg MRI/MRV  Prenatal labs and studies: ABO, Rh: O+  Antibody: Negative Rubella: Immune Varicella: Immune RPR:  NR HBsAg:  Neg HIV: Neg GC/CT: neg/neg  GBS: unknown 1 hr Glucola: early 106   Genetic screening: negative 1st trimester    Prenatal Transfer Tool   Past Medical History  Diagnosis Date  . Asthma     Qvar daily  . Hypertension   . Panic attacks     History reviewed. No pertinent past surgical history.  OB History  Gravida Para Term Preterm AB SAB TAB Ectopic Multiple Living  # Outcome Date GA Lbr Len/2nd Weight Sex Delivery Anes PTL Lv  2 Current           1 Term 02/09/15    F Vag-Spont None Y Y      Social History   Social History  . Marital Status: Married - supportive wife    Spouse Name: N/A  . Number of Children: N/A  . Years of Education: N/A   Social History Main Topics  . Smoking status: Never Smoker   . Smokeless tobacco: None  . Alcohol Use: No  . Drug Use: No  . Sexual Activity: Yes   Other Topics Concern  . None   Social History Narrative    History reviewed. No pertinent family history.  Prescriptions prior to admission  Medication Sig Dispense Refill Last Dose  . albuterol  (PROVENTIL HFA;VENTOLIN HFA) 108 (90 BASE) MCG/ACT inhaler Inhale 2 puffs into the lungs every 6 (six) hours as needed for wheezing or shortness of breath.   03/29/2015 at Unknown time  . beclomethasone (QVAR) 80 MCG/ACT inhaler Inhale 2 puffs into the lungs 2 (two) times daily.   03/29/2015 at Unknown time  . montelukast (SINGULAIR) 10 MG tablet Take 10 mg by mouth at bedtime.   03/29/2015 at Unknown time  . Prenatal Vit-Fe Fumarate-FA (MULTIVITAMIN-PRENATAL) 27-0.8 MG TABS tablet Take 1 tablet by mouth daily at 12 noon.   03/30/2015 at Unknown time  . budesonide (PULMICORT) 180 MCG/ACT inhaler Inhale 2 puffs into the lungs 2 (two) times daily.   03/02/2015 at Unknown time    Allergies  Allergen Reactions  . Strawberry (Diagnostic) Anaphylaxis    Review of Systems: Negative except for what is mentioned in HPI.  Physical Exam: BP 115/58 mmHg  Pulse 88  Temp(Src) 98.6 F (37 C) (Oral)  Resp 19  LMP 09/21/2014 (Exact Date) GENERAL: Well-developed, well-nourished female in no acute distress.  LUNGS: Clear to auscultation bilaterally.  HEART: Regular rate and rhythm. ABDOMEN: Soft, nontender, nondistended, gravid. Negative CVAT EXTREMITIES: Nontender, no edema Cervical Exam: Dilatation 0 cm   Effacement 0 %  Station -3   FHT: Category: 1 Baseline rate 140 bpm   Variability moderate  Accelerations present   Decelerations none Contractions: none per toco   Pertinent Labs/Studies:   Results for orders placed or performed during the hospital encounter of 03/30/15 (from the past 24 hour(s))  Urinalysis complete, with microscopic ([facility] only)     Status: Abnormal   Collection Time: 03/30/15  2:18 PM  Result Value Ref Range   Color, Urine YELLOW (A) YELLOW   APPearance HAZY (A) CLEAR   Glucose, UA NEGATIVE NEGATIVE mg/dL   Bilirubin Urine NEGATIVE NEGATIVE   Ketones, ur NEGATIVE NEGATIVE mg/dL   Specific Gravity, Urine 1.025 1.005 - 1.030   Hgb urine dipstick NEGATIVE NEGATIVE   pH 6.0 5.0  - 8.0   Protein, ur NEGATIVE NEGATIVE mg/dL   Nitrite NEGATIVE NEGATIVE   Leukocytes, UA NEGATIVE NEGATIVE   RBC / HPF 0-5 0 - 5 RBC/hpf   WBC, UA 0-5 0 - 5 WBC/hpf   Bacteria, UA MANY (A) NONE SEEN   Squamous Epithelial / LPF 0-5 (A) NONE SEEN   Mucous PRESENT     Assessment : IUP at 7133w4d, no evidence of preterm labor or urinary symptoms  Plan: Observe for contractions

## 2015-03-30 NOTE — OB Triage Note (Signed)
Patient came in with complaint of loss of mucous plug today at 1:00 pm and contractions that started last night. Patient rates pain 5 out of 10. Patient denies any vaginal bleeding and discharge. Family at bedside. Will continue to monitor.

## 2015-04-02 LAB — OB RESULTS CONSOLE RPR: RPR: NONREACTIVE

## 2015-04-02 LAB — OB RESULTS CONSOLE HIV ANTIBODY (ROUTINE TESTING): HIV: NONREACTIVE

## 2015-04-11 ENCOUNTER — Emergency Department
Admission: EM | Admit: 2015-04-11 | Discharge: 2015-04-11 | Disposition: A | Discharge status: Home / Self Care | Payer: Medicaid Other | Attending: Emergency Medicine | Admitting: Emergency Medicine

## 2015-04-11 ENCOUNTER — Encounter: Payer: Self-pay | Admitting: Emergency Medicine

## 2015-04-11 ENCOUNTER — Emergency Department: Payer: Medicaid Other

## 2015-04-11 DIAGNOSIS — R0789 Other chest pain: Secondary | ICD-10-CM | POA: Diagnosis not present

## 2015-04-11 DIAGNOSIS — Z79899 Other long term (current) drug therapy: Secondary | ICD-10-CM | POA: Diagnosis not present

## 2015-04-11 DIAGNOSIS — F410 Panic disorder [episodic paroxysmal anxiety] without agoraphobia: Secondary | ICD-10-CM | POA: Insufficient documentation

## 2015-04-11 DIAGNOSIS — R0600 Dyspnea, unspecified: Secondary | ICD-10-CM | POA: Diagnosis not present

## 2015-04-11 DIAGNOSIS — E876 Hypokalemia: Secondary | ICD-10-CM | POA: Insufficient documentation

## 2015-04-11 DIAGNOSIS — Z3A30 30 weeks gestation of pregnancy: Secondary | ICD-10-CM | POA: Diagnosis not present

## 2015-04-11 DIAGNOSIS — O99513 Diseases of the respiratory system complicating pregnancy, third trimester: Secondary | ICD-10-CM | POA: Diagnosis present

## 2015-04-11 DIAGNOSIS — Z7951 Long term (current) use of inhaled steroids: Secondary | ICD-10-CM | POA: Diagnosis not present

## 2015-04-11 DIAGNOSIS — I10 Essential (primary) hypertension: Secondary | ICD-10-CM | POA: Insufficient documentation

## 2015-04-11 DIAGNOSIS — J45909 Unspecified asthma, uncomplicated: Secondary | ICD-10-CM | POA: Diagnosis not present

## 2015-04-11 DIAGNOSIS — R0602 Shortness of breath: Secondary | ICD-10-CM

## 2015-04-11 DIAGNOSIS — R079 Chest pain, unspecified: Secondary | ICD-10-CM

## 2015-04-11 LAB — BLOOD GAS, ARTERIAL
ACID-BASE DEFICIT: 2.5 mmol/L — AB (ref 0.0–2.0)
Allens test (pass/fail): POSITIVE — AB
Bicarbonate: 21.2 mEq/L (ref 21.0–28.0)
FIO2: 0.21
O2 SAT: 97.2 %
PATIENT TEMPERATURE: 37
PCO2 ART: 32 mmHg (ref 32.0–48.0)
pH, Arterial: 7.43 (ref 7.350–7.450)
pO2, Arterial: 90 mmHg (ref 83.0–108.0)

## 2015-04-11 LAB — CBC
HCT: 32.8 % — ABNORMAL LOW (ref 35.0–47.0)
HEMOGLOBIN: 11.1 g/dL — AB (ref 12.0–16.0)
MCH: 29.1 pg (ref 26.0–34.0)
MCHC: 33.8 g/dL (ref 32.0–36.0)
MCV: 86 fL (ref 80.0–100.0)
Platelets: 260 10*3/uL (ref 150–440)
RBC: 3.81 MIL/uL (ref 3.80–5.20)
RDW: 15 % — AB (ref 11.5–14.5)
WBC: 18.8 10*3/uL — ABNORMAL HIGH (ref 3.6–11.0)

## 2015-04-11 LAB — BASIC METABOLIC PANEL
ANION GAP: 7 (ref 5–15)
BUN: 6 mg/dL (ref 6–20)
CALCIUM: 8.9 mg/dL (ref 8.9–10.3)
CO2: 19 mmol/L — AB (ref 22–32)
CREATININE: 0.45 mg/dL (ref 0.44–1.00)
Chloride: 107 mmol/L (ref 101–111)
GFR calc Af Amer: >60 mL/min (ref >60)
GLUCOSE: 113 mg/dL — AB (ref 65–99)
Potassium: 3.1 mmol/L — ABNORMAL LOW (ref 3.5–5.1)
Sodium: 133 mmol/L — ABNORMAL LOW (ref 135–145)

## 2015-04-11 MED ORDER — SODIUM CHLORIDE 0.9 % IV BOLUS (SEPSIS): 1000.0000 mL | Freq: Once | INTRAVENOUS | Status: DC

## 2015-04-11 MED ORDER — POTASSIUM CHLORIDE CRYS ER 20 MEQ PO TBCR
40.0000 meq | EXTENDED_RELEASE_TABLET | Freq: Once | ORAL | Status: AC
  Administered 2015-04-11: 40 meq via ORAL
  Filled 2015-04-11: qty 2

## 2015-04-11 MED ORDER — SODIUM CHLORIDE 0.9 % IV BOLUS (SEPSIS)
1000.0000 mL | Freq: Once | INTRAVENOUS | Status: AC
  Administered 2015-04-11: 1000 mL via INTRAVENOUS

## 2015-04-11 MED ORDER — IOPAMIDOL (ISOVUE-370) INJECTION 76%
75.0000 mL | Freq: Once | INTRAVENOUS | Status: AC | PRN
  Administered 2015-04-11: 75 mL via INTRAVENOUS

## 2015-04-11 NOTE — ED Notes (Addendum)
Pt reports dizziness x 3 weeks; reports shortness of breath and chest pain, diaphoresis with episodes. Pt is [redacted] weeks pregnant. FHR 130. Pt is G2P1.

## 2015-04-11 NOTE — ED Provider Notes (Signed)
[facility] Emergency Department Provider Note  ____________________________________________  Time seen: Approximately 3:40 PM  I have reviewed the triage vital signs and the nursing notes.   HISTORY  Chief Complaint Dizziness  HPI Julia Cherry is a 25 y.o. female who is approximately 30 weeks OB who has had progressive worsening shortness of breath, tachycardia, and sweating that has increased over the past 3-4 weeks. Patient states that she had told her OB/GYN doctor, and they told her today to come and have it further evaluated since she was not getting any better over time. Patient denies any recent cough or cold symptoms she denies any significant dysuria frequency or hematuria. She denies any nausea vomiting or change in her bowels. Patient states that she does have some midsternal chest pain this been intermittent but it does not appear to be related to a call or her shortness of breath. She has not had problems with gastroesophageal reflux disease during this time. She states her pain on scale of 0-10 is like a 2-3 in her chest but she is more short of breath and diaphoretic today. Patient denies any swelling in her lower extremities or pain. Nothing seems to make it worse or better other than exertion makes her breathing worse.   Past Medical History  Diagnosis Date  . Asthma     Qvar daily  . Hypertension   . Panic attacks     Patient Active Problem List   Diagnosis Date Noted  . Back pain affecting pregnancy, antepartum 03/30/2015  . Bilateral lower abdominal pain 02/27/2015  . Abdominal pain affecting pregnancy, antepartum 02/09/2015    History reviewed. No pertinent past surgical history.  Current Outpatient Rx  Name  Route  Sig  Dispense  Refill  . albuterol (PROVENTIL HFA;VENTOLIN HFA) 108 (90 BASE) MCG/ACT inhaler   Inhalation   Inhale 2 puffs into the lungs every 6 (six) hours as needed for wheezing or shortness of breath.          . beclomethasone (QVAR) 80 MCG/ACT inhaler   Inhalation   Inhale 2 puffs into the lungs 2 (two) times daily.         . budesonide (PULMICORT) 180 MCG/ACT inhaler   Inhalation   Inhale 2 puffs into the lungs 2 (two) times daily.         . diphenhydrAMINE (BENADRYL) 25 mg capsule   Oral   Take 2 capsules (50 mg total) by mouth every 6 (six) hours as needed. Patient not taking: Reported on 03/30/2015   60 capsule   0   . montelukast (SINGULAIR) 10 MG tablet   Oral   Take 10 mg by mouth at bedtime.         . Prenatal Vit-Fe Fumarate-FA (MULTIVITAMIN-PRENATAL) 27-0.8 MG TABS tablet   Oral   Take 1 tablet by mouth daily at 12 noon.           Allergies Strawberry (diagnostic)  No family history on file.  Social History Social History  Substance Use Topics  . Smoking status: Never Smoker   . Smokeless tobacco: None  . Alcohol Use: No    Review of Systems Constitutional: No fever/chills, patient has had diaphoresis off and on as well. Eyes: No visual changes. ENT: No sore throat. Cardiovascular: Occasional midsternal chest pain, tachycardia Respiratory: Positive for shortness of breath. Gastrointestinal: No abdominal pain.  No nausea, no vomiting.  No diarrhea.  No constipation. Genitourinary: Negative for dysuria. Musculoskeletal: Negative for back pain. Skin:  Negative for rash. Neurological: Negative for headaches, focal weakness or numbness. 10-point ROS otherwise negative.  ____________________________________________   PHYSICAL EXAM:  VITAL SIGNS: ED Triage Vitals  Enc Vitals Group     BP 04/11/15 1523 128/77 mmHg     Pulse Rate 04/11/15 1523 109     Resp 04/11/15 1523 19     Temp 04/11/15 1523 98.6 F (37 C)     Temp src --      SpO2 04/11/15 1523 98 %     Weight 04/11/15 1523 176 lb (79.833 kg)     Height 04/11/15 1523 5' (1.524 m)     Head Cir --      Peak Flow --      Pain Score 04/11/15 1533 5     Pain Loc --      Pain Edu? --       Excl. in GC? --     Constitutional: Alert and oriented. Well appearing and in mild distress secondary to her shortness of breath and she is mildly diaphoretic on arrival to the ER. Eyes: Conjunctivae are normal. PERRL. EOMI. Head: Atraumatic. Nose: No congestion/rhinnorhea. Mouth/Throat: Mucous membranes are moist.  Oropharynx non-erythematous. Neck: No stridor.   Cardiovascular: Resting tachycardia, regular rhythm. Grossly normal heart sounds.  Good peripheral circulation. Respiratory: Normal respiratory effort.  No retractions. Lungs CTAB. Gastrointestinal: Soft and nontender. No distention. No abdominal bruits. No CVA tenderness. Musculoskeletal: No lower extremity tenderness nor edema.  No joint effusions. Neurologic:  Normal speech and language. No gross focal neurologic deficits are appreciated. No gait instability. Skin:  Skin is warm, dry and intact. No rash noted. Psychiatric: Mood and affect are normal. Speech and behavior are normal.  ____________________________________________   LABS (all labs ordered are listed, but only abnormal results are displayed)  Labs Reviewed  BASIC METABOLIC PANEL - Abnormal; Notable for the following:    Sodium 133 (*)    Potassium 3.1 (*)    CO2 19 (*)    Glucose, Bld 113 (*)    All other components within normal limits  CBC - Abnormal; Notable for the following:    WBC 18.8 (*)    Hemoglobin 11.1 (*)    HCT 32.8 (*)    RDW 15.0 (*)    All other components within normal limits  BLOOD GAS, ARTERIAL - Abnormal; Notable for the following:    Acid-base deficit 2.5 (*)    Allens test (pass/fail) POSITIVE (*)    All other components within normal limits  URINALYSIS COMPLETEWITH MICROSCOPIC ([facility] ONLY)   ____________________________________________  EKG  ED ECG REPORT I, Leona Carry, the attending physician, personally viewed and interpreted this ECG.   Date: 04/11/2015  EKG Time: 1529  Rate: 99  Rhythm: normal sinus  rhythm  Axis: Normal  Intervals:none  ST&T Change: Nonspecific ST and T-wave changes  ____________________________________________  RADIOLOGY Ct Angio Chest Pe W/cm &/or Wo Cm  04/11/2015  CLINICAL DATA:  Shortness of breath and chest pain.  Dizziness. EXAM: CT ANGIOGRAPHY CHEST WITH CONTRAST TECHNIQUE: Multidetector CT imaging of the chest was performed using the standard protocol during bolus administration of intravenous contrast. Multiplanar CT image reconstructions and MIPs were obtained to evaluate the vascular anatomy. CONTRAST:  75 mL Isovue 370 nonionic COMPARISON:  December 29, 2014 FINDINGS: Mediastinum/Lymph Nodes: There is no demonstrable pulmonary embolus. There is no thoracic aortic aneurysm or dissection. The visualized great vessels appear normal. The pericardium is not appreciably thickened. Visualized thyroid appears unremarkable. There  is no demonstrable thoracic adenopathy. Lungs/Pleura: On axial slice 67, there is a 2 mm nodular opacity in the lateral segment of the right middle lobe. On axial slice 81 series 6, there is a 3 mm nodular opacity arising in the left major fissure inferiorly. On axial slice 61 series 6, there is a 6 x 3 mm nodular opacity in the medial segment of the right middle lobe. There is no parenchymal lung edema or consolidation. Upper abdomen: Visualized upper abdominal structures appear unremarkable. Musculoskeletal: There are no blastic or lytic bone lesions. Review of the MIP images confirms the above findings. IMPRESSION: No demonstrable pulmonary embolus. No edema or consolidation. Small lung nodular opacities, noted above, are likely benign in this age group. No adenopathy evident. Electronically Signed   By: Bretta Bang III M.D.   On: 04/11/2015 18:10    ____________________________________________   PROCEDURES  Procedure(s) performed: None  Critical Care performed: No  ____________________________________________   INITIAL IMPRESSION /  ASSESSMENT AND PLAN / ED COURSE  Pertinent labs & imaging results that were available during my care of the patient were reviewed by me and considered in my medical decision making (see chart for details).  8:10 PM Patient will be given IV fluids along with getting routine labs and ABG at this time. It is felt that if all of those are normal we will get a CT age of the chest to rule out PE. Patient is in agreement to have the CTA of the chest despite warnings for exposure to her unborn child since she is approximately 30 weeks at this time, and this poses minimal risk.  8:10 PM Patient's potassium as well as her CO2 were low. Patient was given a liter IV fluids and 40 mEq of potassium. We decided to go ahead and get a CT angiogram of the chest to evaluate for her shortness of breath. 8:10 PM She is much improved. Patient is going to be discharged home to follow-up with her OB/GYN doctor. Patient ABG showed a PO2 of 90 and her CT of the chest was essentially normal. Patient was told to call her OB/GYN in the morning for them to do any further workup. ____________________________________________   FINAL CLINICAL IMPRESSION(S) / ED DIAGNOSES  Final diagnoses:  Shortness of breath  Dyspnea  Chest pain, unspecified chest pain type  Hypokalemia      Leona Carry, MD 04/11/15 2010

## 2015-05-09 ENCOUNTER — Encounter: Payer: Self-pay | Admitting: Obstetrics and Gynecology

## 2015-05-26 ENCOUNTER — Emergency Department
Admission: EM | Admit: 2015-05-26 | Discharge: 2015-05-26 | Disposition: A | Discharge status: Home / Self Care | Payer: Medicaid Other | Attending: Emergency Medicine | Admitting: Emergency Medicine

## 2015-05-26 ENCOUNTER — Encounter: Payer: Self-pay | Admitting: Emergency Medicine

## 2015-05-26 DIAGNOSIS — Z79899 Other long term (current) drug therapy: Secondary | ICD-10-CM | POA: Diagnosis not present

## 2015-05-26 DIAGNOSIS — H9202 Otalgia, left ear: Secondary | ICD-10-CM | POA: Diagnosis present

## 2015-05-26 DIAGNOSIS — O26899 Other specified pregnancy related conditions, unspecified trimester: Secondary | ICD-10-CM | POA: Insufficient documentation

## 2015-05-26 DIAGNOSIS — Z3A00 Weeks of gestation of pregnancy not specified: Secondary | ICD-10-CM | POA: Insufficient documentation

## 2015-05-26 DIAGNOSIS — L049 Acute lymphadenitis, unspecified: Secondary | ICD-10-CM

## 2015-05-26 DIAGNOSIS — I10 Essential (primary) hypertension: Secondary | ICD-10-CM | POA: Diagnosis not present

## 2015-05-26 DIAGNOSIS — J45909 Unspecified asthma, uncomplicated: Secondary | ICD-10-CM | POA: Diagnosis not present

## 2015-05-26 DIAGNOSIS — Z91018 Allergy to other foods: Secondary | ICD-10-CM | POA: Diagnosis not present

## 2015-05-26 MED ORDER — AMOXICILLIN 875 MG PO TABS: 875.0000 mg | ORAL_TABLET | Freq: Two times a day (BID) | ORAL | Status: DC

## 2015-05-26 NOTE — ED Notes (Signed)
Denies any pregnancy related symptoms.

## 2015-05-26 NOTE — Discharge Instructions (Signed)

## 2015-05-26 NOTE — ED Notes (Signed)
Nodule in front and behind R ear, painful, first noted yesterday.

## 2015-05-26 NOTE — ED Notes (Signed)
Pt c/o nodule behind R ear, in front of R ear, and below R ear. Pt states noticed yesterday. Nodules palpated at this time. NAD noted. Pt able to speak and open and close her jaw. Pt c/o pain at this time.

## 2015-05-26 NOTE — ED Provider Notes (Signed)
[facility] Emergency Department Provider Note  ____________________________________________  Time seen: Approximately 10:20 AM  I have reviewed the triage vital signs and the nursing notes.   HISTORY  Chief Complaint Facial Pain    HPI Julia Cherry is a 25 y.o. female , NAD, presents to the emergency department with one-day history of swollen nodules about her right ear. States she noted a swollen nodule behind the right ear yesterday. Woke this morning and noted another one in front of the right ear. Notes she had left ear pain over the last few days but has not had any right ear pain. Denies any nasal congestion, runny nose, sore throat, sinus pressure. Has not had any neck or head pain. Denies fever, chills, body aches. Is pregnant but denies any abdominal pain, nausea, vomiting, vaginal bleeding or discharge.   Past Medical History  Diagnosis Date  . Asthma     Qvar daily  . Hypertension   . Panic attacks     Patient Active Problem List   Diagnosis Date Noted  . Back pain affecting pregnancy, antepartum 03/30/2015  . Bilateral lower abdominal pain 02/27/2015  . Abdominal pain affecting pregnancy, antepartum 02/09/2015    History reviewed. No pertinent past surgical history.  Current Outpatient Rx  Name  Route  Sig  Dispense  Refill  . albuterol (PROVENTIL HFA;VENTOLIN HFA) 108 (90 BASE) MCG/ACT inhaler   Inhalation   Inhale 2 puffs into the lungs every 6 (six) hours as needed for wheezing or shortness of breath.         Marland Kitchen. amoxicillin (AMOXIL) 875 MG tablet   Oral   Take 1 tablet (875 mg total) by mouth 2 (two) times daily.   20 tablet   0   . beclomethasone (QVAR) 80 MCG/ACT inhaler   Inhalation   Inhale 2 puffs into the lungs 2 (two) times daily.         . budesonide (PULMICORT) 180 MCG/ACT inhaler   Inhalation   Inhale 2 puffs into the lungs 2 (two) times daily.         . montelukast (SINGULAIR) 10 MG tablet  Oral   Take 10 mg by mouth at bedtime.         . Prenatal Vit-Fe Fumarate-FA (MULTIVITAMIN-PRENATAL) 27-0.8 MG TABS tablet   Oral   Take 1 tablet by mouth daily at 12 noon.           Allergies Strawberry (diagnostic)  No family history on file.  Social History Social History  Substance Use Topics  . Smoking status: Never Smoker   . Smokeless tobacco: None  . Alcohol Use: No     Review of Systems  Constitutional: No fever/chills Eyes: No discharge, redness, swelling ENT: Positive nodules behind and in front of the right ear. No sore throat or sinus pressure, nasal congestion, runny nose, ear pain. Cardiovascular: No chest pain. Respiratory: No cough. No shortness of breath. No wheezing.  Gastrointestinal: No abdominal pain.  No nausea, vomiting.  Genitourinary: No vaginal bleeding, discharge. Musculoskeletal: Negative for neck pain or general myalgias.  Skin: Negative for rash, redness, skin sores, oozing, weeping. Neurological: Negative for headaches, focal weakness or numbness. 10-point ROS otherwise negative.  ____________________________________________   PHYSICAL EXAM:  VITAL SIGNS: ED Triage Vitals  Enc Vitals Group     BP 05/26/15 0902 131/68 mmHg     Pulse Rate 05/26/15 0902 110     Resp 05/26/15 0902 20     Temp 05/26/15 0902  98 F (36.7 C)     Temp Source 05/26/15 0902 Oral     SpO2 05/26/15 0902 99 %     Weight 05/26/15 0902 179 lb (81.194 kg)     Height 05/26/15 0902  (1.575 m)     Head Cir --      Peak Flow --      Pain Score 05/26/15 0903 7     Pain Loc --      Pain Edu? --      Excl. in GC? --      Constitutional: Alert and oriented. Well appearing and in no acute distress. Eyes: Conjunctivae are normal.  Head: Atraumatic. ENT:      Ears: No mastoid tenderness nor fluctuance noted to palpation bilaterally. TMs visual loss bilaterally without effusion, bulging, erythema, perforation.      Nose: No congestion/rhinnorhea.       Mouth/Throat: Mucous membranes are moist. Pharynx without erythema, swelling, exudate. Clear postnasal drip. Neck: No cervical spine tenderness to palpation. Supple with full range of motion. Hematological/Lymphatic/Immunilogical: Positive focal, preauricular and posterior auricular lymphadenopathy with mild tenderness to palpation and all are mobile. Cardiovascular: Good peripheral circulation. Respiratory: Normal respiratory effort without tachypnea or retractions. Lungs CTAB. Neurologic:  Normal speech and language.  Skin:  Skin is warm, dry and intact. No rash, redness, swelling, skin sores, oozing, weeping noted. Psychiatric: Mood and affect are normal. Speech and behavior are normal. Patient exhibits appropriate insight and judgement.   ____________________________________________   LABS  None  ____________________________________________  EKG  None ____________________________________________  RADIOLOGY  None ____________________________________________    PROCEDURES  Procedure(s) performed: None   Medications - No data to display   ____________________________________________   INITIAL IMPRESSION / ASSESSMENT AND PLAN / ED COURSE  Patient's diagnosis is consistent with acute lymphadenitis. Patient will be discharged home with prescriptions for amoxicillin to take as directed. Patient should also apply warm compress to the areas 20 minutes 3-4 times daily as needed. Patient is to follow up with Dr. Linus Salmons in ENT on Monday for recheck.  Patient is given strict ED precautions to return to the ED for any worsening or new symptoms.    ____________________________________________  FINAL CLINICAL IMPRESSION(S) / ED DIAGNOSES  Final diagnoses:  Acute lymphadenitis      NEW MEDICATIONS STARTED DURING THIS VISIT:  New Prescriptions   AMOXICILLIN (AMOXIL) 875 MG TABLET    Take 1 tablet (875 mg total) by mouth 2 (two) times daily.         Hope Pigeon, PA-C 05/26/15 1101  Myrna Blazer, MD 05/26/15 [phone]

## 2015-05-31 ENCOUNTER — Encounter: Payer: Self-pay | Admitting: *Deleted

## 2015-05-31 ENCOUNTER — Observation Stay
Admission: EM | Admit: 2015-05-31 | Discharge: 2015-05-31 | Disposition: A | Discharge status: Home / Self Care | Payer: Medicaid Other | Attending: Obstetrics and Gynecology | Admitting: Obstetrics and Gynecology

## 2015-05-31 DIAGNOSIS — Z3A37 37 weeks gestation of pregnancy: Secondary | ICD-10-CM | POA: Diagnosis not present

## 2015-05-31 DIAGNOSIS — O10913 Unspecified pre-existing hypertension complicating pregnancy, third trimester: Secondary | ICD-10-CM | POA: Insufficient documentation

## 2015-05-31 DIAGNOSIS — O471 False labor at or after 37 completed weeks of gestation: Secondary | ICD-10-CM | POA: Diagnosis present

## 2015-05-31 DIAGNOSIS — M549 Dorsalgia, unspecified: Secondary | ICD-10-CM | POA: Diagnosis present

## 2015-05-31 MED ORDER — CITRIC ACID-SODIUM CITRATE 334-500 MG/5ML PO SOLN: 30.0000 mL | ORAL | Status: DC | PRN

## 2015-05-31 NOTE — Discharge Summary (Addendum)
OB Triage Discharge Summary   25 y.o. G2P1001 @ 4244w3d presenting for labor evaluation.  Fetal status reassuring. EFM showed rNST, occasional UCs and +uterine irritability. Triage course: mild range BP (pt has cHTN), unchanged SVE at 1-2/long/high over several hours and checks. Occasional maternal artifact on EFM as patient was sitting up in bed, per RN.   Labs pending: none RTC as scheduled Disposition: Home  Triage evaluation done remotely: Yes.    Cornelia Copaharlie Rossetta Kama, Jr MD [facility]  Pager: [phone]

## 2015-05-31 NOTE — Progress Notes (Signed)
Dr Vergie LivingPickens on unit, aware [facility] Dr Vergie LivingPickens stated RN to reassess.

## 2015-05-31 NOTE — Final Progress Note (Signed)
See discharge summary

## 2015-05-31 NOTE — OB Triage Note (Signed)
FHT reactive, occasional ctx with irritability, no signs of active labor, cervix remains unchanged after several exams, rates abd pain 6/10 and back pain 7/10/, VSS, afebrile. EFM d'ced. Discharge instructions and teaching completed with pt and s/o, discharge paperwork including handouts related to labor provided to pt. Encouraged pt to rest, stay well hydrated, warm shower, ambulate and f/u with primary OB as scheduled. Pt is aware she may return to hospital with any worsening symptoms. Pt reports active fetal movement and still having abd and back discomfort but feels fine to go home with labor precautions that were provided.

## 2015-05-31 NOTE — Progress Notes (Signed)
Dr Vergie LivingPickens requesting pt to walk, and for re-exam of cervix in approx 1700, 1715.

## 2015-05-31 NOTE — OB Triage Note (Signed)
Resting quietly

## 2015-05-31 NOTE — Progress Notes (Signed)
Dr Vergie Livingpickens given update, awarethat pt desires to go home. Dr Vergie LivingPickens requesting pt be placed in bed and attempt a continuous "FHR strip- currently- non continuous due to pt on ball

## 2015-06-03 ENCOUNTER — Observation Stay
Admission: EM | Admit: 2015-06-03 | Discharge: 2015-06-03 | Disposition: A | Discharge status: Home / Self Care | Payer: Medicaid Other | Attending: Obstetrics & Gynecology | Admitting: Obstetrics & Gynecology

## 2015-06-03 DIAGNOSIS — M545 Low back pain, unspecified: Secondary | ICD-10-CM | POA: Diagnosis present

## 2015-06-03 DIAGNOSIS — Z3A39 39 weeks gestation of pregnancy: Secondary | ICD-10-CM | POA: Insufficient documentation

## 2015-06-03 DIAGNOSIS — O471 False labor at or after 37 completed weeks of gestation: Secondary | ICD-10-CM | POA: Diagnosis present

## 2015-06-03 MED ORDER — ACETAMINOPHEN 325 MG PO TABS: 650.0000 mg | ORAL_TABLET | ORAL | Status: DC | PRN

## 2015-06-03 MED ORDER — ONDANSETRON HCL 4 MG/2ML IJ SOLN: 4.0000 mg | Freq: Four times a day (QID) | INTRAMUSCULAR | Status: DC | PRN

## 2015-06-03 NOTE — OB Triage Note (Signed)
Pt d/c home with labor precautions.

## 2015-06-03 NOTE — OB Triage Note (Signed)
Contractions and lower abdominal pressure starting around 1600 yesterday afternoon.  Pt reports +FM and denies LOF.

## 2015-06-04 NOTE — Final Progress Note (Signed)
Physician Final Progress Note  Patient ID: Julia Cherry MRN: [id number] DOB/AGE: 04-24-1990 24 y.o.  Admit date: 06/03/2015 Admitting provider: Annice NeedyKelvin C Kodee Ravert, MD Discharge date: 06/04/2015   Admission Diagnoses:Contractions and low back pain  Discharge Diagnoses:  Active Problems:   Low back pain  Procedures: NST Reactive.  A NST procedure was performed with FHR monitoring and a normal baseline established, appropriate time of 20-40 minutes of evaluation, and accels >2 seen w 15x15 characteristics.  Results show a REACTIVE NST.   Discharge Condition: good, no s/sx labor after monitoring  Disposition: 01-Home or Self Care  Diet: Regular diet  Discharge Activity: Activity as tolerated     Medication List    ASK your doctor about these medications        albuterol 108 (90 Base) MCG/ACT inhaler  Commonly known as:  PROVENTIL HFA;VENTOLIN HFA  Inhale 2 puffs into the lungs every 6 (six) hours as needed for wheezing or shortness of breath.     amoxicillin 875 MG tablet  Commonly known as:  AMOXIL  Take 1 tablet (875 mg total) by mouth 2 (two) times daily.     beclomethasone 80 MCG/ACT inhaler  Commonly known as:  QVAR  Inhale 2 puffs into the lungs 2 (two) times daily.     budesonide 180 MCG/ACT inhaler  Commonly known as:  PULMICORT  Inhale 2 puffs into the lungs 2 (two) times daily. Reported on 06/03/2015     montelukast 10 MG tablet  Commonly known as:  SINGULAIR  Take 10 mg by mouth at bedtime.     multivitamin-prenatal 27-0.8 MG Tabs tablet  Take 1 tablet by mouth daily at 12 noon.           Follow-up Information    Follow up with Julia Libraobert Paul Jniya Madara, MD. Go on 06/05/2015.   Specialty:  Obstetrics and Gynecology   Why:  As needed, If symptoms worsen   Contact information:   [address] [place] [address] [phone]       Total time spent taking care of this patient: triage, nurse visit  Signed: Letitia LibraRobert Paul Sadiq Cherry 06/04/2015,  1:04 AM

## 2015-06-06 LAB — OB RESULTS CONSOLE GC/CHLAMYDIA
Chlamydia: NEGATIVE
Gonorrhea: NEGATIVE

## 2015-06-06 LAB — OB RESULTS CONSOLE GBS: GBS: NEGATIVE

## 2015-06-15 ENCOUNTER — Inpatient Hospital Stay
Admission: EM | Admit: 2015-06-15 | Discharge: 2015-06-15 | Disposition: A | Discharge status: Home / Self Care | Payer: Medicaid Other | Attending: Obstetrics & Gynecology | Admitting: Obstetrics & Gynecology

## 2015-06-15 ENCOUNTER — Encounter: Payer: Self-pay | Admitting: *Deleted

## 2015-06-15 DIAGNOSIS — Z3A38 38 weeks gestation of pregnancy: Secondary | ICD-10-CM | POA: Insufficient documentation

## 2015-06-15 DIAGNOSIS — O26893 Other specified pregnancy related conditions, third trimester: Secondary | ICD-10-CM | POA: Diagnosis present

## 2015-06-15 NOTE — OB Triage Note (Signed)
Patient sent from office by Dr Jean RosenthalJackson for contractions and decels in the office.

## 2015-06-15 NOTE — Final Progress Note (Signed)
Physician Final Progress Note  Patient ID: Julia Cherry MRN: [id number] DOB/AGE: 08-Apr-1990 24 y.o.  Admit date: 06/15/2015 Admitting provider: Nadara Mustardobert P Harris, MD Discharge date: 06/15/2015   Admission Diagnoses: Possible fetal heart rate deceleration in office IUP at 38wk1d  Discharge Diagnoses:  IUP at 38wk1day with reactive non stress test False vs early labor  Consults: none  Significant Findings/ Diagnostic Studies: 25 year old G1 P0 with EDC=06/28/2015 by LMP=5wk ultrasound presented from office for prolonged monitoring after there was a possible fetal heart rate deceleration to 90s in an otherwise reactive strip. AFI in office was 10.56cm and baby is vertex. Has been contracting since this AM and is having some back pain. Prenatal care remarkable for asthma (using QVAR and singulair daily and Ventalin prn), anxiety, LGSIL Pap (colpo c/w CIN1), chtn (no meds), THC use, mild anemia, a n elevated one hour GTT with 1/4 abnormal values on 3hrGTT, and back pain.  BP 115/66 mmHg  Pulse 95  LMP 09/21/2014 (Exact Date) FHR: patient monitored for 2-3 hours, FHT baseline 135 with accelerations to 150s to 160, moderate variability with no decelerations noted Toco: frequent contractions q2-5 minutes apart, but many 30 sec or less in duration Cervix: 1/50%/-1 (was told she was 1.5 cm previously)  A: IUP at 38wk1d with Cat 1 tracing/ reactive NST Prodromal vs false labor  P: DC home with labor precautions FU at office 6 June for NST and to discuss induction  Procedures: Non stress test  Discharge Condition: stable  Disposition: 01-Home or Self Care  Diet: Regular diet  Discharge Activity: Activity as tolerated    Follow-up Information    Follow up with Conard NovakJackson, Stephen D, MD On 06/19/2015.   Specialty:  Obstetrics and Gynecology   Contact information:   [address] [place] [address] [phone]       Total time spent taking care of this patient: 15  minutes  Signed: Farrel ConnersGUTIERREZ, Julia Simkin 06/15/2015, 6:09 PM

## 2015-06-15 NOTE — Discharge Instructions (Signed)
Drink plenty of fluids and get plenty of rest.  Call physician if you have any concerns.

## 2015-06-15 NOTE — Discharge Summary (Signed)
Patient d/c home with family.  Discharge instructions given.  Patient states understanding.  Left in stable condition.  Expressed no concerns at this time.

## 2015-06-21 ENCOUNTER — Inpatient Hospital Stay
Admit: 2015-06-21 | Discharge: 2015-06-24 | DRG: 774 | Disposition: A | Discharge status: Home / Self Care | Payer: Medicaid Other | Attending: Obstetrics and Gynecology | Admitting: Obstetrics and Gynecology

## 2015-06-21 DIAGNOSIS — Z3490 Encounter for supervision of normal pregnancy, unspecified, unspecified trimester: Secondary | ICD-10-CM

## 2015-06-21 DIAGNOSIS — O10913 Unspecified pre-existing hypertension complicating pregnancy, third trimester: Secondary | ICD-10-CM | POA: Diagnosis present

## 2015-06-21 DIAGNOSIS — Z3A39 39 weeks gestation of pregnancy: Secondary | ICD-10-CM

## 2015-06-21 DIAGNOSIS — O1092 Unspecified pre-existing hypertension complicating childbirth: Secondary | ICD-10-CM | POA: Diagnosis present

## 2015-06-21 LAB — TYPE AND SCREEN
ABO/RH(D): O POS
ANTIBODY SCREEN: NEGATIVE

## 2015-06-21 LAB — CBC
HEMATOCRIT: 36.3 % (ref 35.0–47.0)
HEMOGLOBIN: 12.2 g/dL (ref 12.0–16.0)
MCH: 28.4 pg (ref 26.0–34.0)
MCHC: 33.6 g/dL (ref 32.0–36.0)
MCV: 84.4 fL (ref 80.0–100.0)
Platelets: 278 10*3/uL (ref 150–440)
RBC: 4.3 MIL/uL (ref 3.80–5.20)
RDW: 15.8 % — AB (ref 11.5–14.5)
WBC: 15.1 10*3/uL — AB (ref 3.6–11.0)

## 2015-06-21 MED ORDER — ZOLPIDEM TARTRATE 5 MG PO TABS: 5.0000 mg | ORAL_TABLET | Freq: Every evening | ORAL | Status: DC | PRN

## 2015-06-21 MED ORDER — OXYTOCIN 40 UNITS IN LACTATED RINGERS INFUSION - SIMPLE MED
1.0000 m[IU]/min | INTRAVENOUS | Status: DC
  Administered 2015-06-21: 2 m[IU]/min via INTRAVENOUS
  Filled 2015-06-21: qty 1000

## 2015-06-21 MED ORDER — OXYTOCIN 40 UNITS IN LACTATED RINGERS INFUSION - SIMPLE MED
2.5000 [IU]/h | INTRAVENOUS | Status: DC
  Administered 2015-06-22: 2.5 [IU]/h via INTRAVENOUS
  Filled 2015-06-21: qty 1000

## 2015-06-21 MED ORDER — LACTATED RINGERS IV SOLN: 500.0000 mL | INTRAVENOUS | Status: DC | PRN

## 2015-06-21 MED ORDER — TERBUTALINE SULFATE 1 MG/ML IJ SOLN: 0.2500 mg | Freq: Once | INTRAMUSCULAR | Status: DC | PRN

## 2015-06-21 MED ORDER — SODIUM CHLORIDE 0.9 % IJ SOLN
INTRAMUSCULAR | Status: AC
  Filled 2015-06-21: qty 50

## 2015-06-21 MED ORDER — LACTATED RINGERS IV SOLN
INTRAVENOUS | Status: DC
  Administered 2015-06-21: 22:00:00 via INTRAVENOUS

## 2015-06-21 MED ORDER — ONDANSETRON HCL 4 MG/2ML IJ SOLN
4.0000 mg | Freq: Four times a day (QID) | INTRAMUSCULAR | Status: DC | PRN
  Filled 2015-06-21: qty 2

## 2015-06-21 MED ORDER — SOD CITRATE-CITRIC ACID 500-334 MG/5ML PO SOLN: 30.0000 mL | ORAL | Status: DC | PRN

## 2015-06-21 MED ORDER — OXYTOCIN BOLUS FROM INFUSION
500.0000 mL | INTRAVENOUS | Status: DC
  Administered 2015-06-22: 500 mL via INTRAVENOUS

## 2015-06-21 MED ORDER — ACETAMINOPHEN 500 MG PO TABS: 1000.0000 mg | ORAL_TABLET | ORAL | Status: DC | PRN

## 2015-06-21 MED ORDER — BUTORPHANOL TARTRATE 1 MG/ML IJ SOLN
1.0000 mg | INTRAMUSCULAR | Status: DC | PRN
  Administered 2015-06-21 – 2015-06-22 (×3): 1 mg via INTRAVENOUS
  Filled 2015-06-21 (×3): qty 1

## 2015-06-21 MED ORDER — LIDOCAINE HCL (PF) 1 % IJ SOLN: 30.0000 mL | INTRAMUSCULAR | Status: DC | PRN

## 2015-06-22 ENCOUNTER — Inpatient Hospital Stay: Payer: Medicaid Other | Admitting: Anesthesiology

## 2015-06-22 ENCOUNTER — Encounter: Payer: Self-pay | Admitting: Anesthesiology

## 2015-06-22 LAB — URINE DRUG SCREEN, QUALITATIVE (ARMC ONLY)
AMPHETAMINES, UR SCREEN: NOT DETECTED
BENZODIAZEPINE, UR SCRN: NOT DETECTED
Barbiturates, Ur Screen: NOT DETECTED
COCAINE METABOLITE, UR ~~LOC~~: NOT DETECTED
Cannabinoid 50 Ng, Ur ~~LOC~~: NOT DETECTED
MDMA (ECSTASY) UR SCREEN: NOT DETECTED
METHADONE SCREEN, URINE: NOT DETECTED
OPIATE, UR SCREEN: NOT DETECTED
Phencyclidine (PCP) Ur S: NOT DETECTED
Tricyclic, Ur Screen: NOT DETECTED

## 2015-06-22 MED ORDER — PRENATAL MULTIVITAMIN CH
1.0000 | ORAL_TABLET | Freq: Every day | ORAL | Status: DC
  Administered 2015-06-23 – 2015-06-24 (×2): 1 via ORAL
  Filled 2015-06-22 (×2): qty 1

## 2015-06-22 MED ORDER — OXYCODONE-ACETAMINOPHEN 5-325 MG PO TABS: 2.0000 | ORAL_TABLET | ORAL | Status: DC | PRN

## 2015-06-22 MED ORDER — OXYCODONE-ACETAMINOPHEN 5-325 MG PO TABS
1.0000 | ORAL_TABLET | ORAL | Status: DC | PRN
  Administered 2015-06-22 – 2015-06-23 (×4): 1 via ORAL
  Filled 2015-06-22 (×5): qty 1

## 2015-06-22 MED ORDER — PHENYLEPHRINE 40 MCG/ML (10ML) SYRINGE FOR IV PUSH (FOR BLOOD PRESSURE SUPPORT)
80.0000 ug | PREFILLED_SYRINGE | INTRAVENOUS | Status: DC | PRN
  Filled 2015-06-22: qty 5

## 2015-06-22 MED ORDER — DIBUCAINE 1 % RE OINT: 1.0000 "application " | TOPICAL_OINTMENT | RECTAL | Status: DC | PRN

## 2015-06-22 MED ORDER — ONDANSETRON HCL 4 MG/2ML IJ SOLN
4.0000 mg | INTRAMUSCULAR | Status: DC | PRN
  Administered 2015-06-22: 4 mg via INTRAVENOUS

## 2015-06-22 MED ORDER — WITCH HAZEL-GLYCERIN EX PADS: 1.0000 "application " | MEDICATED_PAD | CUTANEOUS | Status: DC | PRN

## 2015-06-22 MED ORDER — BENZOCAINE-MENTHOL 20-0.5 % EX AERO: 1.0000 "application " | INHALATION_SPRAY | CUTANEOUS | Status: DC | PRN

## 2015-06-22 MED ORDER — FERROUS SULFATE 325 (65 FE) MG PO TABS
325.0000 mg | ORAL_TABLET | Freq: Every day | ORAL | Status: DC
  Administered 2015-06-23 – 2015-06-24 (×2): 325 mg via ORAL
  Filled 2015-06-22 (×2): qty 1

## 2015-06-22 MED ORDER — ONDANSETRON HCL 4 MG PO TABS: 4.0000 mg | ORAL_TABLET | ORAL | Status: DC | PRN

## 2015-06-22 MED ORDER — ZOLPIDEM TARTRATE 5 MG PO TABS
5.0000 mg | ORAL_TABLET | Freq: Every evening | ORAL | Status: DC | PRN
Start: 2015-06-22 — End: 2015-06-24

## 2015-06-22 MED ORDER — LIDOCAINE HCL (PF) 1 % IJ SOLN
INTRAMUSCULAR | Status: DC | PRN
  Administered 2015-06-22: 3 mL
  Administered 2015-06-22: 2 mL

## 2015-06-22 MED ORDER — LACTATED RINGERS IV SOLN: INTRAVENOUS | Status: DC

## 2015-06-22 MED ORDER — BUPIVACAINE HCL (PF) 0.25 % IJ SOLN
INTRAMUSCULAR | Status: DC | PRN
  Administered 2015-06-22: 5 mL via EPIDURAL
  Administered 2015-06-22: 3 mL via EPIDURAL

## 2015-06-22 MED ORDER — EPHEDRINE 5 MG/ML INJ
10.0000 mg | INTRAVENOUS | Status: DC | PRN
  Filled 2015-06-22: qty 2

## 2015-06-22 MED ORDER — LACTATED RINGERS IV SOLN: 500.0000 mL | Freq: Once | INTRAVENOUS | Status: DC

## 2015-06-22 MED ORDER — ACETAMINOPHEN 325 MG PO TABS: 650.0000 mg | ORAL_TABLET | ORAL | Status: DC | PRN

## 2015-06-22 MED ORDER — FENTANYL 2.5 MCG/ML W/ROPIVACAINE 0.2% IN NS 100 ML EPIDURAL INFUSION (ARMC-ANES): 10.0000 mL/h | EPIDURAL | Status: DC

## 2015-06-22 MED ORDER — LIDOCAINE-EPINEPHRINE (PF) 1.5 %-1:200000 IJ SOLN
INTRAMUSCULAR | Status: DC | PRN
  Administered 2015-06-22 (×2): 3 mL via PERINEURAL

## 2015-06-22 MED ORDER — IBUPROFEN 600 MG PO TABS
600.0000 mg | ORAL_TABLET | Freq: Four times a day (QID) | ORAL | Status: DC
  Administered 2015-06-22 – 2015-06-24 (×7): 600 mg via ORAL
  Filled 2015-06-22 (×7): qty 1

## 2015-06-22 MED ORDER — COCONUT OIL OIL: 1.0000 "application " | TOPICAL_OIL | Status: DC | PRN

## 2015-06-22 MED ORDER — DIPHENHYDRAMINE HCL 25 MG PO CAPS: 25.0000 mg | ORAL_CAPSULE | Freq: Four times a day (QID) | ORAL | Status: DC | PRN

## 2015-06-22 MED ORDER — FENTANYL 2.5 MCG/ML W/ROPIVACAINE 0.2% IN NS 100 ML EPIDURAL INFUSION (ARMC-ANES)
EPIDURAL | Status: AC
  Administered 2015-06-22: 10 mL/h via EPIDURAL
  Filled 2015-06-22: qty 100

## 2015-06-22 MED ORDER — DIPHENHYDRAMINE HCL 50 MG/ML IJ SOLN: 12.5000 mg | INTRAMUSCULAR | Status: DC | PRN

## 2015-06-22 MED ORDER — SIMETHICONE 80 MG PO CHEW: 80.0000 mg | CHEWABLE_TABLET | ORAL | Status: DC | PRN

## 2015-06-22 NOTE — Anesthesia Procedure Notes (Signed)
Epidural Patient location during procedure: OB Start time: 06/22/2015 2:06 PM End time: 06/22/2015 2:45 PM  Staffing Anesthesiologist: Berdine AddisonHOMAS, MATHAI Performed by: anesthesiologist   Preanesthetic Checklist Completed: patient identified, site marked, surgical consent, pre-op evaluation, timeout performed, IV checked, risks and benefits discussed and monitors and equipment checked  Epidural Patient position: sitting Prep: Betadine Patient monitoring: heart rate, continuous pulse ox and blood pressure Approach: midline Location: L4-L5 Injection technique: LOR saline  Needle:  Needle type: Tuohy  Needle gauge: 17 G Needle length: 9 cm and 9 Catheter type: closed end flexible Catheter size: 20 Guage Test dose: negative and 1.5% lidocaine with Epi 1:200 K  Assessment Events: blood not aspirated, injection not painful, no injection resistance, negative IV test and no paresthesia  Additional Notes Attempt #1 by CRNA Junious SilkMark Lynzy Rawles.  Insertion of epi cath but cath kinked and could not bolus fluid.  Took out catheter with blue tip intact.  MDA Thomas attempt #2 successful. nop complications.  Pt comfortable with no complaints.  VSS no apparent distress.  Patient tolerated the insertion well without complications.Reason for block:procedure for pain

## 2015-06-22 NOTE — Progress Notes (Signed)
In to see pt. Cook catheter in place. Gentle traction applied with no movement of catheter. Will continue to monitor.  Anticipate vaginal delivery soon.

## 2015-06-22 NOTE — Progress Notes (Signed)
L&D Note  06/22/2015 - 3:01 PM  25 y.o. G2P1001 4922w1d   [name] [facility]   Subjective:  Pt c/o intense pain despite recent dose of stadol. Cook catheter still in place   Objective:   Filed Vitals:   06/22/15 1436 06/22/15 1437 06/22/15 1439 06/22/15 1441  BP:  144/72 116/69 117/48  Pulse:  97 116 96  Temp:    98.4 F (36.9 C)  TempSrc:    Oral  Resp:      Height:      Weight:      SpO2: 97%   99%    Current Vital Signs 24h Vital Sign Ranges  T 98.4 F (36.9 C) Temp  Avg: 98.1 F (36.7 C)  Min: 97.6 F (36.4 C)  Max: 98.4 F (36.9 C)  BP (!) 117/48 mmHg BP  Min: 110/57  Max: 146/73  HR 96 Pulse  Avg: 93.5  Min: 80  Max: 116  RR 18 Resp  Avg: 18  Min: 18  Max: 18  SaO2 99 %   SpO2  Avg: 98.5 %  Min: 97 %  Max: 100 %       24 Hour I/O Current Shift I/O  Time Ins Outs 06/08 0701 - 06/09 0700 In: 1069.1 [I.V.:1069.1] Out: -       FHR: cat 1  Toco: regular SVE: 5-6/100/-1   Assessment :  IUP at 7122w1d, IOL for CHTN    Plan:  Cook catheter removed - cervical exam as above. Will proceed to epidural soon and plan for AROM after pt is comfortable.   Julia Cherry, Julia Cherry, [place]

## 2015-06-22 NOTE — Progress Notes (Signed)
Pt transferred via w/c to MBU 344, Iv pump infusion of Pitocin  continuing. Family and belongings accompanying. Nursery Transition RN accompanying with newborn in bassinet.
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
## 2015-06-22 NOTE — Discharge Summary (Signed)
Obstetrical Discharge Summary  Date of Admission: 06/21/2015 Date of Discharge: 06/24/2015   Primary OB: [facility]  Gestational Age at Delivery: 2470w1d   Antepartum complications: CHTN (no meds) normal growth testing and AP testing Reason for Admission: IOL for CHTN Date of Delivery: T. Brothers, CNM  Delivered By: T. Brothers, CNM Delivery Type: spontaneous vaginal delivery Intrapartum complications/course: None Anesthesia: epidural Placenta: Delivered and expressed via active management. Intact: yes. To pathology: no.  Laceration: none Episiotomy: none EBL: 400 Baby: Liveborn female, APGARs 9/9, weight 3310 g.   Discharge Diagnosis: Delivered.   Postpartum course: Uneventful.  By time of discharge, patient had been ambulating without difficulty, voiding spontaneously, tolerating pain with PO meds and eating a regular diet. Discharge Vital Signs:  Current Vital Signs 24h Vital Sign Ranges  T 98.9 F (37.2 C) Temp  Avg: 98.1 F (36.7 C)  Min: 97.6 F (36.4 C)  Max: 98.9 F (37.2 C)  BP 129/70 mmHg BP  Min: 107/53  Max: 129/70  HR 84 Pulse  Avg: 79  Min: 67  Max: 85  RR 18 Resp  Avg: 18.5  Min: 18  Max: 20  SaO2 100 % Not Delivered SpO2  Avg: 99 %  Min: 98 %  Max: 100 %       24 Hour I/O Current Shift I/O  Time Ins Outs        Discharge Exam:  NAD CV: RRR PULM: CTABL, nl effort Abdomen: s/nd/nt,  firm fundus below the umbilicus.  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 Follow-up appointment with T. Brothers, CNM in 6 weeks for a Postpartum visit  Discharge Medications:   Medication List    TAKE these medications        albuterol 108 (90 Base) MCG/ACT inhaler  Commonly known as:  PROVENTIL HFA;VENTOLIN HFA  Inhale 2 puffs into the lungs every 6 (six) hours as needed for wheezing or shortness of breath.     amoxicillin 875 MG tablet  Commonly known as:  AMOXIL  Take 1 tablet (875 mg total) by mouth 2 (two) times daily.     beclomethasone 80 MCG/ACT inhaler  Commonly known as:  QVAR  Inhale 2 puffs into the lungs 2 (two) times daily.     ibuprofen 600 MG tablet  Commonly known as:  ADVIL,MOTRIN  Take 1 tablet (600 mg total) by mouth every 6 (six) hours.     montelukast 10 MG tablet  Commonly known as:  SINGULAIR  Take 10 mg by mouth at bedtime.     multivitamin-prenatal 27-0.8 MG Tabs tablet  Take 1 tablet by mouth daily at 12 noon.       ----- Ranae Plumberhelsea Champayne Kocian, MD Attending Obstetrician and Gynecologist [facility]

## 2015-06-23 LAB — CBC
HEMATOCRIT: 33.9 % — AB (ref 35.0–47.0)
Hemoglobin: 11.4 g/dL — ABNORMAL LOW (ref 12.0–16.0)
MCH: 28.4 pg (ref 26.0–34.0)
MCHC: 33.5 g/dL (ref 32.0–36.0)
MCV: 84.8 fL (ref 80.0–100.0)
PLATELETS: 239 10*3/uL (ref 150–440)
RBC: 4 MIL/uL (ref 3.80–5.20)
RDW: 15.8 % — AB (ref 11.5–14.5)
WBC: 21.1 10*3/uL — AB (ref 3.6–11.0)

## 2015-06-23 LAB — RPR: RPR: NONREACTIVE

## 2015-06-23 NOTE — Anesthesia Postprocedure Evaluation (Signed)
Anesthesia Post Note  Patient: Julia Cherry  Procedure(s) Performed: * No procedures listed *  Patient location during evaluation: Mother Baby Anesthesia Type: Epidural Level of consciousness: awake and alert Pain management: pain level controlled Vital Signs Assessment: post-procedure vital signs reviewed and stable Respiratory status: spontaneous breathing, nonlabored ventilation, respiratory function stable and patient connected to nasal cannula oxygen Cardiovascular status: blood pressure returned to baseline and stable Postop Assessment: no signs of nausea or vomiting Anesthetic complications: no    Last Vitals:  Filed Vitals:   06/23/15 1221 06/23/15 1620  BP: 110/56 107/53  Pulse: 85 67  Temp: 36.6 C 36.4 C  Resp: 18 20    Last Pain:  Filed Vitals:   06/23/15 1620  PainSc: 0-No pain                 Lenard SimmerAndrew Liora Myles

## 2015-06-23 NOTE — Progress Notes (Signed)
Post Partum Day 1 Subjective: voiding, tolerating PO and tired  Objective: Blood pressure 102/41, pulse 81, temperature 97.6 F (36.4 C), temperature source Oral, resp. rate 18, height 5\' 2"  (1.575 m), weight 82.101 kg (181 lb), last menstrual period 09/21/2014, SpO2 98 %, unknown if currently breastfeeding.  Physical Exam:  General: alert, cooperative and no distress Lochia: appropriate Uterine Fundus: firm/ U-1 DVT Evaluation: No evidence of DVT seen on physical exam.   Recent Labs  06/21/15 2203 06/23/15 0618  HGB 12.2 11.4*  HCT 36.3 33.9*  WBC 15.1* 21.1*  PLT 278 239    Assessment/Plan: Stable ppd #1 Mild acute blood loss anemia-prenatal vitamins Plan for discharge tomorrow  Bottle O POS/RI/ VI/ TDAP UTD   LOS: 2 days   Lamere Lightner 06/23/2015, 11:16 AM

## 2015-06-23 NOTE — Anesthesia Post-op Follow-up Note (Signed)
  Anesthesia Pain Follow-up Note  Patient: Gwenevere Abbotaja [facility]ille [facility]  Day #: 1  Date of Follow-up: 06/23/2015 Time: 6:19 PM  Last Vitals:  Filed Vitals:   06/23/15 1221 06/23/15 1620  BP: 110/56 107/53  Pulse: 85 67  Temp: 36.6 C 36.4 C  Resp: 18 20    Level of Consciousness: alert  Pain: mild   Side Effects:None  Catheter Site Exam:clean, dry, no drainage  Plan: D/C from anesthesia care  Lenard SimmerAndrew Tillman Kazmierski

## 2015-06-24 MED ORDER — IBUPROFEN 600 MG PO TABS: 600.0000 mg | ORAL_TABLET | Freq: Four times a day (QID) | ORAL | Status: DC

## 2015-06-24 NOTE — Progress Notes (Signed)
Patient discharged home with infant and significant other. Discharge instructions, prescriptions and follow up appointment given to and reviewed with patient and significant other. Patient verbalized understanding. Escorted out via wheelchair by San JettyLetitia Elks, RN.

## 2015-06-24 NOTE — Discharge Instructions (Signed)
Discharge instructions:   Call office if you have any of the following: headache, visual changes, fever >101.0 F, chills, breast concerns, excessive vaginal bleeding, incision drainage or problems, leg pain or redness, depression or any other concerns.   Activity: Do not lift > 10 lbs for 6 weeks.  No intercourse or tampons for 6 weeks.  No driving for 1-2 weeks.   Call your doctor for increased pain or vaginal bleeding, temperature above 101.0, depression, or concerns.  No strenuous activity or heavy lifting for 6 weeks.  No intercourse, tampons, douching, or enemas for 6 weeks.  No tub baths-showers only.  No driving for 2 weeks or while taking pain medications.  Continue prenatal vitamin and iron.  Increase calories and fluids while breastfeeding.  You may have a slight fever when your milk comes in, but it should go away on its own.  If it does not, and rises above 101.0 please call the doctor.  For concerns about your baby, please call your pediatrician For breastfeeding concerns, the lactation consultant can be reached at [phone]   Your asthma may improve during the post partum period.  You may need to adjust your meds accordingly.

## 2015-06-25 NOTE — H&P (Signed)
See paper H&P   [facility]  Contracting too much for prostaglandins.  Foley bulb and pitocin.  ----- Ranae Plumberhelsea Ward, MD Attending Obstetrician and Gynecologist [facility]

## 2015-09-24 ENCOUNTER — Encounter: Payer: Self-pay | Admitting: Emergency Medicine

## 2015-09-24 ENCOUNTER — Emergency Department
Admission: EM | Admit: 2015-09-24 | Discharge: 2015-09-24 | Disposition: A | Discharge status: Home / Self Care | Payer: Medicaid Other | Source: Home / Self Care | Attending: Emergency Medicine | Admitting: Emergency Medicine

## 2015-09-24 ENCOUNTER — Emergency Department: Payer: Medicaid Other

## 2015-09-24 DIAGNOSIS — Z79899 Other long term (current) drug therapy: Secondary | ICD-10-CM | POA: Diagnosis not present

## 2015-09-24 DIAGNOSIS — M25511 Pain in right shoulder: Secondary | ICD-10-CM | POA: Insufficient documentation

## 2015-09-24 DIAGNOSIS — J45901 Unspecified asthma with (acute) exacerbation: Secondary | ICD-10-CM

## 2015-09-24 DIAGNOSIS — Z760 Encounter for issue of repeat prescription: Secondary | ICD-10-CM

## 2015-09-24 DIAGNOSIS — J209 Acute bronchitis, unspecified: Secondary | ICD-10-CM | POA: Diagnosis not present

## 2015-09-24 DIAGNOSIS — J45909 Unspecified asthma, uncomplicated: Secondary | ICD-10-CM | POA: Insufficient documentation

## 2015-09-24 DIAGNOSIS — J069 Acute upper respiratory infection, unspecified: Secondary | ICD-10-CM

## 2015-09-24 DIAGNOSIS — R05 Cough: Secondary | ICD-10-CM | POA: Diagnosis present

## 2015-09-24 DIAGNOSIS — I10 Essential (primary) hypertension: Secondary | ICD-10-CM | POA: Diagnosis not present

## 2015-09-24 LAB — BASIC METABOLIC PANEL
ANION GAP: 9 (ref 5–15)
BUN: 12 mg/dL (ref 6–20)
CO2: 21 mmol/L — AB (ref 22–32)
Calcium: 9.4 mg/dL (ref 8.9–10.3)
Chloride: 106 mmol/L (ref 101–111)
Creatinine, Ser: 0.62 mg/dL (ref 0.44–1.00)
Glucose, Bld: 151 mg/dL — ABNORMAL HIGH (ref 65–99)
Potassium: 3.6 mmol/L (ref 3.5–5.1)
SODIUM: 136 mmol/L (ref 135–145)

## 2015-09-24 LAB — CBC
HCT: 38.2 % (ref 35.0–47.0)
HEMOGLOBIN: 12.9 g/dL (ref 12.0–16.0)
MCH: 28.9 pg (ref 26.0–34.0)
MCHC: 33.8 g/dL (ref 32.0–36.0)
MCV: 85.4 fL (ref 80.0–100.0)
Platelets: 275 10*3/uL (ref 150–440)
RBC: 4.47 MIL/uL (ref 3.80–5.20)
RDW: 14.3 % (ref 11.5–14.5)
WBC: 9.9 10*3/uL (ref 3.6–11.0)

## 2015-09-24 LAB — TROPONIN I

## 2015-09-24 MED ORDER — PREDNISONE 10 MG PO TABS: ORAL_TABLET | ORAL | 0 refills | Status: DC

## 2015-09-24 MED ORDER — BECLOMETHASONE DIPROPIONATE 80 MCG/ACT IN AERS: 2.0000 | INHALATION_SPRAY | Freq: Two times a day (BID) | RESPIRATORY_TRACT | 1 refills | Status: DC

## 2015-09-24 MED ORDER — ALBUTEROL SULFATE HFA 108 (90 BASE) MCG/ACT IN AERS: 2.0000 | INHALATION_SPRAY | RESPIRATORY_TRACT | 1 refills | Status: DC | PRN

## 2015-09-24 NOTE — Discharge Instructions (Signed)
Continue asthma medication as directed. Begin prednisone today beginning with 60 mg and tapering down. Follow-up with your primary care doctor if any continued problems.
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
## 2015-09-24 NOTE — ED Notes (Signed)
See triage note  Sore throat ,congestion and non productive cough for about 3-4 days .Marland Kitchen. No fever   Also having some pain to right shoulder with movement  No injury/trauma  No deformity noted

## 2015-09-24 NOTE — ED Triage Notes (Signed)
Patient presents to the ED with congestion, sore throat, and non-productive cough since Friday.  Patient is also complaining of right shoulder pain that is worse with neck movement or arm movement.  Patient reports history of asthma and bronchitis.  Patient is in no obvious distress.  Ambulatory to triage without difficulty.  Patient is in no obvious distress at this time.

## 2015-09-24 NOTE — ED Triage Notes (Signed)
Pt states was here earlier today d/t asthma, reports was rx prednisone. States tonight vomited x 2, states noticed blood in emesis. Pt c/o chest pain "right after I started vomited."

## 2015-09-25 ENCOUNTER — Emergency Department
Admission: EM | Admit: 2015-09-25 | Discharge: 2015-09-25 | Disposition: A | Discharge status: Home / Self Care | Payer: Medicaid Other | Attending: Emergency Medicine | Admitting: Emergency Medicine

## 2015-09-25 DIAGNOSIS — J209 Acute bronchitis, unspecified: Secondary | ICD-10-CM

## 2015-09-25 MED ORDER — BENZONATATE 100 MG PO CAPS
200.0000 mg | ORAL_CAPSULE | Freq: Once | ORAL | Status: AC
  Administered 2015-09-25: 200 mg via ORAL
  Filled 2015-09-25: qty 2

## 2015-09-25 MED ORDER — BENZONATATE 100 MG PO CAPS: 100.0000 mg | ORAL_CAPSULE | Freq: Four times a day (QID) | ORAL | 0 refills | Status: AC | PRN

## 2015-09-25 MED ORDER — AZITHROMYCIN 500 MG PO TABS
500.0000 mg | ORAL_TABLET | Freq: Once | ORAL | Status: AC
  Administered 2015-09-25: 500 mg via ORAL
  Filled 2015-09-25: qty 1

## 2015-09-25 MED ORDER — AZITHROMYCIN 500 MG PO TABS: 500.0000 mg | ORAL_TABLET | Freq: Every day | ORAL | 0 refills | Status: AC
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
## 2016-06-11 IMAGING — US US OB TRANSVAGINAL
1 series · 14 of 28 positions shown · non-contrast
Comparison: None.

CLINICAL DATA: Lower abdominal and pelvic pain for 3 days

EXAM:
OBSTETRIC <14 WK US AND TRANSVAGINAL OB US
TECHNIQUE: Both transabdominal and transvaginal ultrasound examinations were
performed for complete evaluation of the gestation as well as the
maternal uterus, adnexal regions, and pelvic cul-de-sac.
Transvaginal technique was performed to assess early pregnancy.

[Series 1: us ob transvaginal · 0.24mm/px · 14 of 78 slices shown]
[im 3/78]
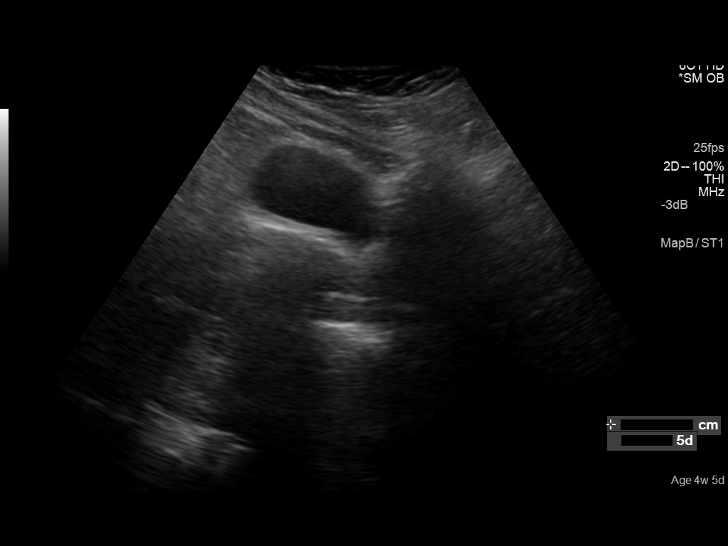
[im 9/78]
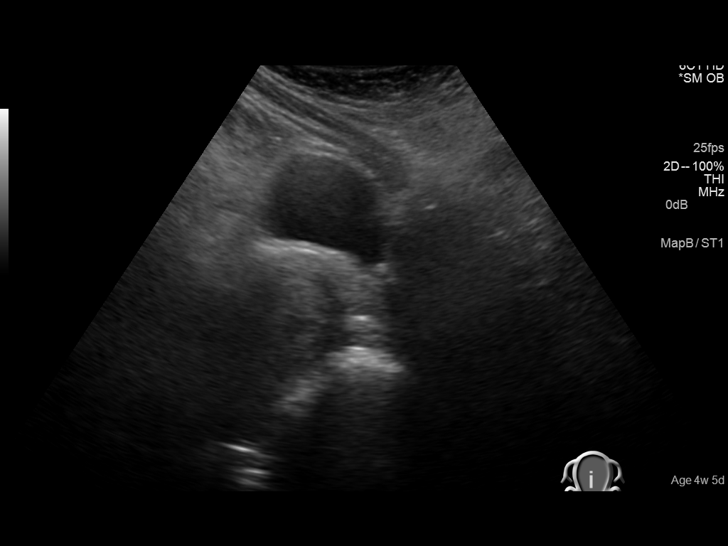
[im 15/78]
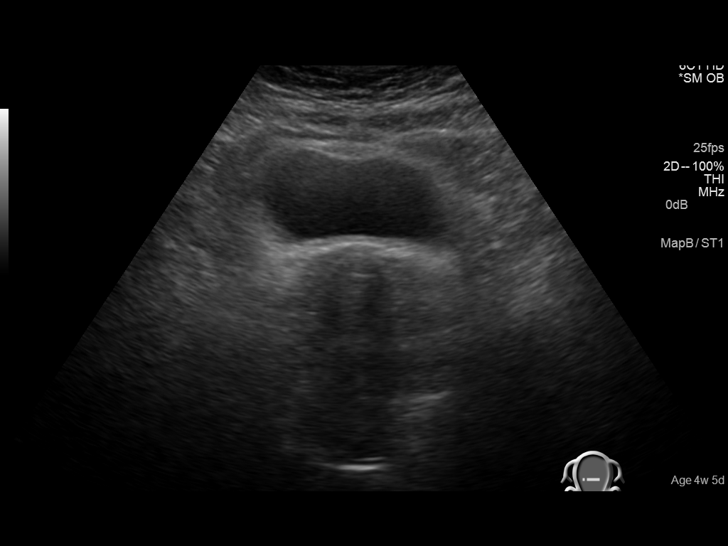
[im 20/78]
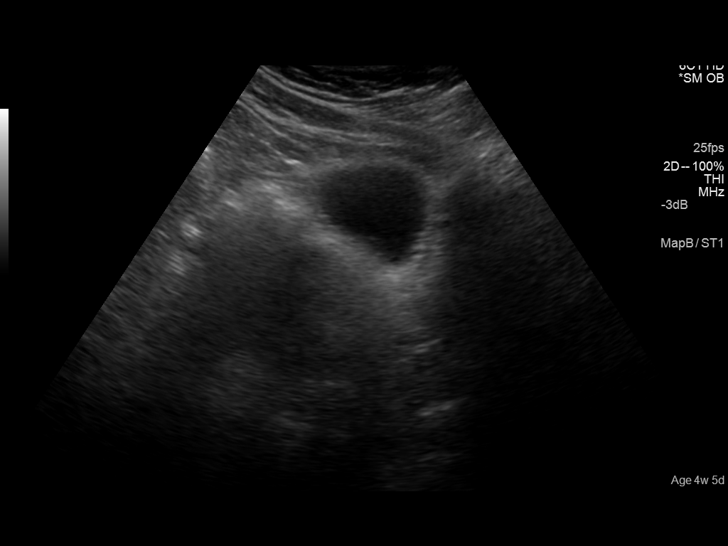
[im 26/78]
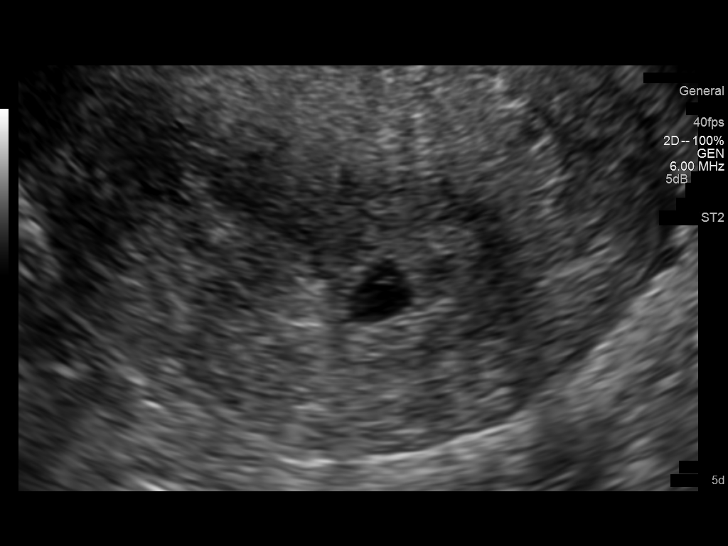
[im 32/78]
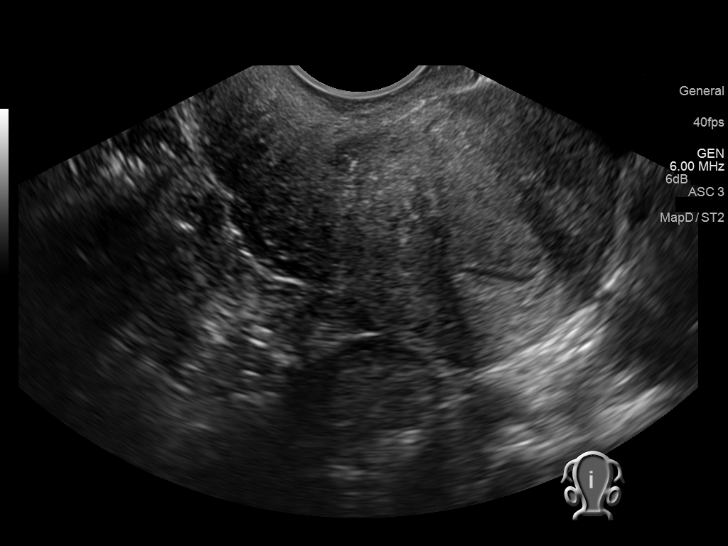
[im 38/78]
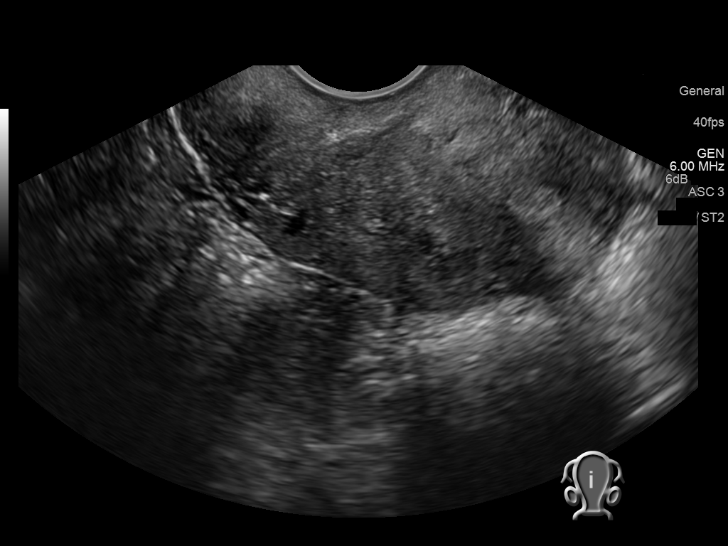
[im 43/78]
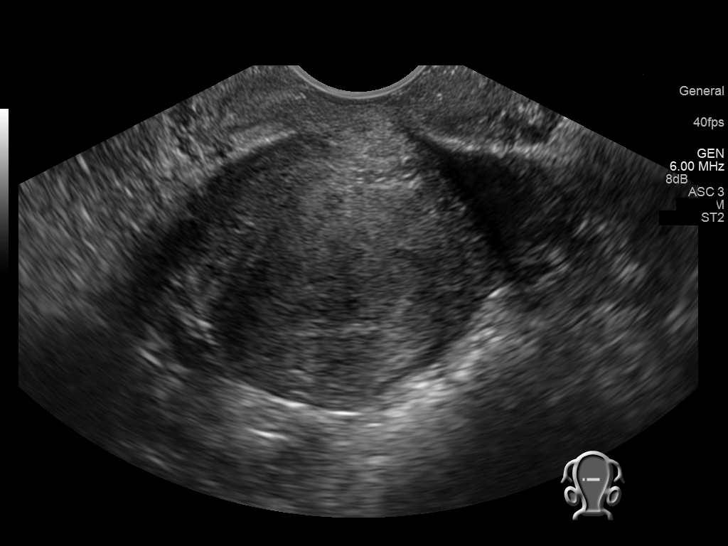
[im 49/78]
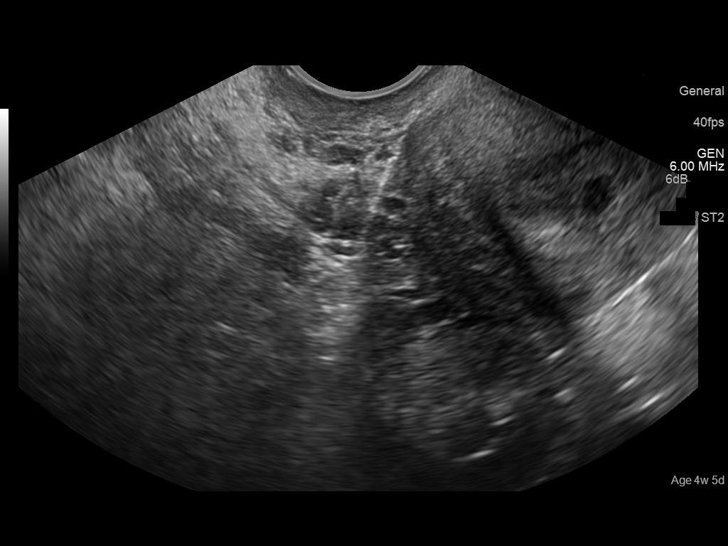
[im 55/78]
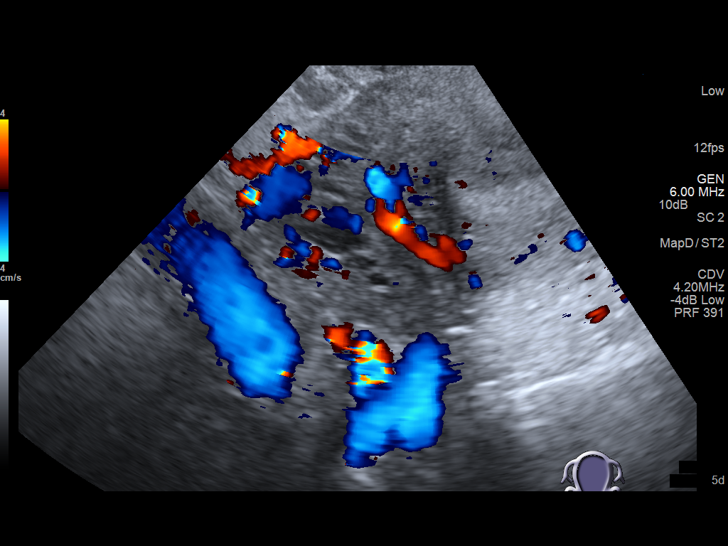
[im 60/78]
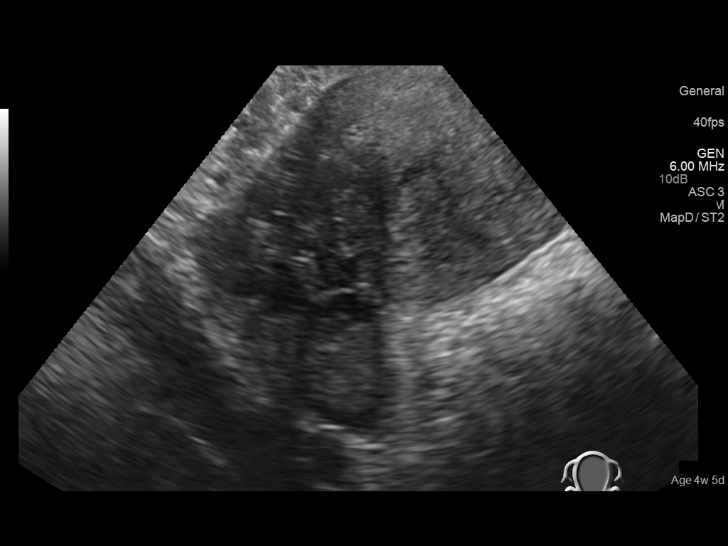
[im 66/78]
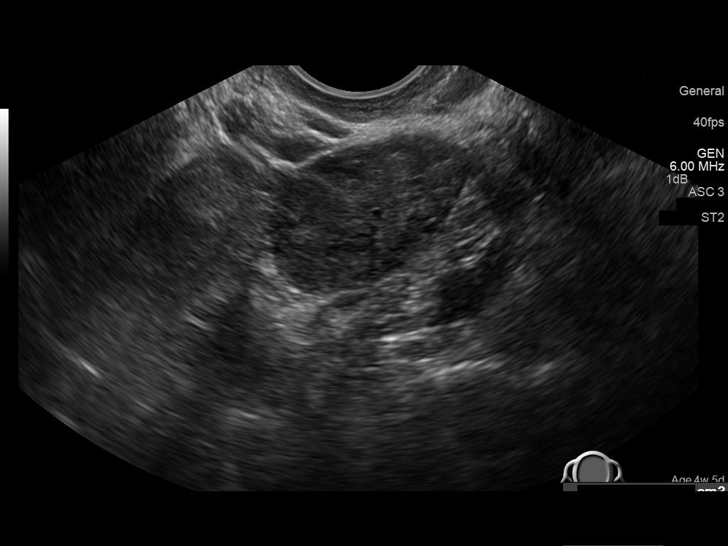
[im 72/78]
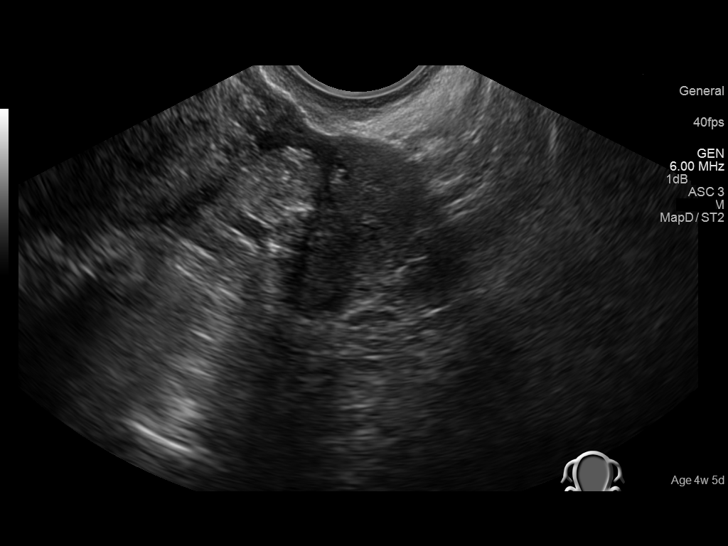
[im 78/78]
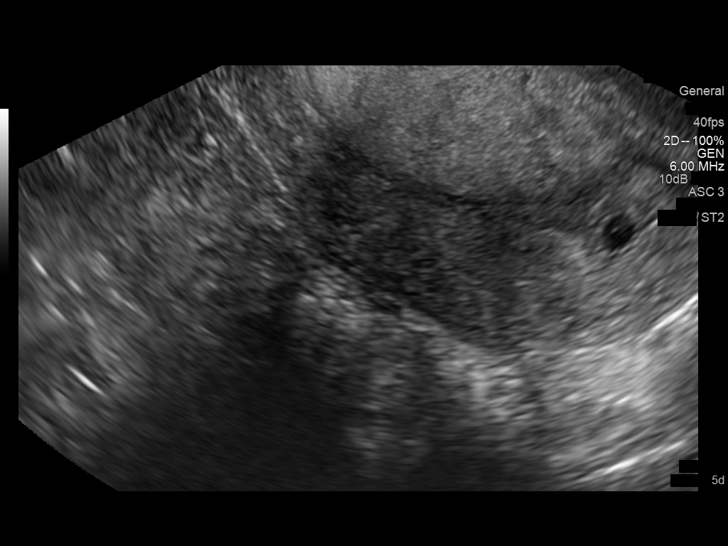

[14 of 28 positions shown; findings below may reference images not displayed]

FINDINGS: Intrauterine gestational sac: Visualized/normal in shape.

Yolk sac:  Visualized

Embryo:  Not visualized

Cardiac Activity: Not visualized

MSD: 6  mm   5 w   2  d

Maternal uterus/adnexae: There is no demonstrable subchorionic
hemorrhage. Cervical os is closed. There are no maternal
extrauterine pelvic or adnexal masses. No maternal free fluid.
IMPRESSION: Gestational sac containing a yolk sac is seen. Based on gestational
sac size, estimated gestational age is 5+ weeks. Fetal pole is
currently not seen. Given this situation, followup study in
approximately 10-14 days advised. Study otherwise unremarkable.

## 2016-07-10 IMAGING — US US ABDOMEN COMPLETE
1 series · 14 of 25 positions shown · non-contrast
Comparison: 04/01/2012 abdominal CT

CLINICAL DATA: Crampy abdominal pain.  Elevated white count.

EXAM:
ULTRASOUND ABDOMEN COMPLETE

[Series 1: us abdomen complete · 0.17mm/px · 14 of 188 slices shown]
[im 1/188]
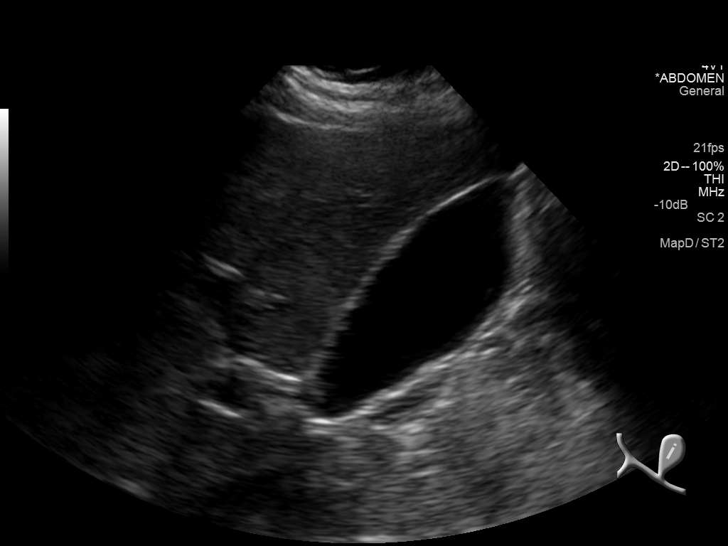
[im 16/188]
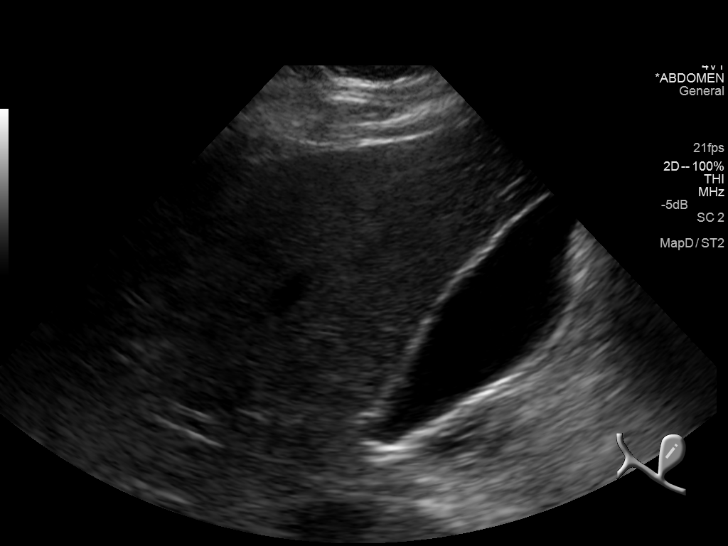
[im 32/188]
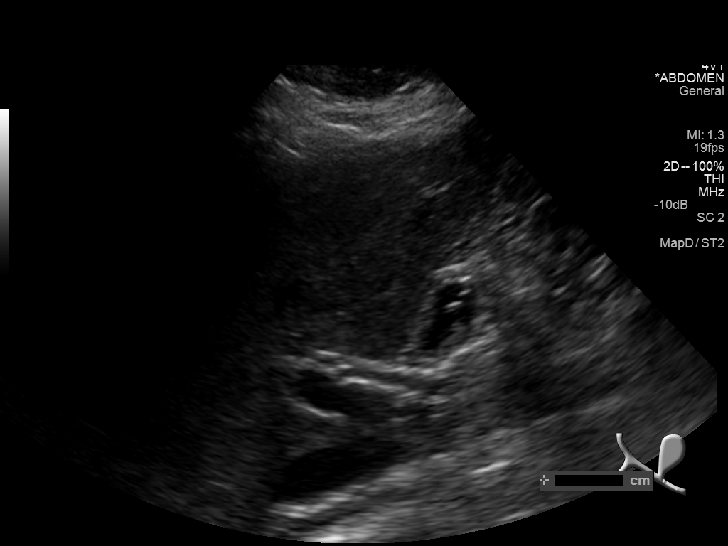
[im 47/188]
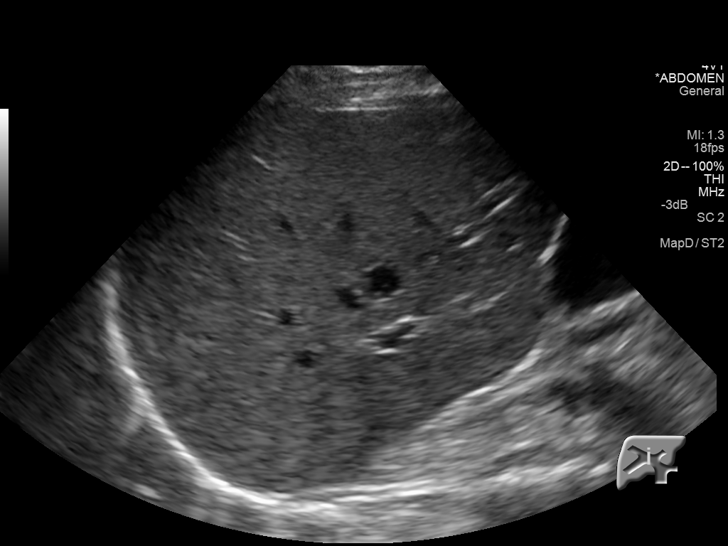
[im 63/188]
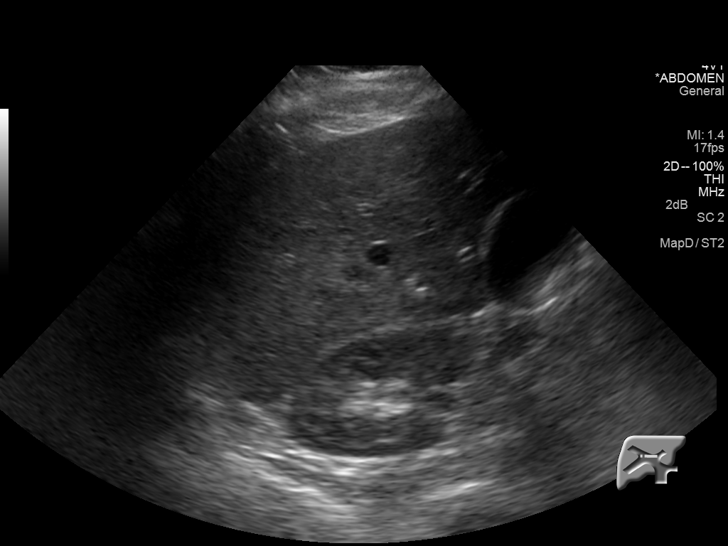
[im 71/188]
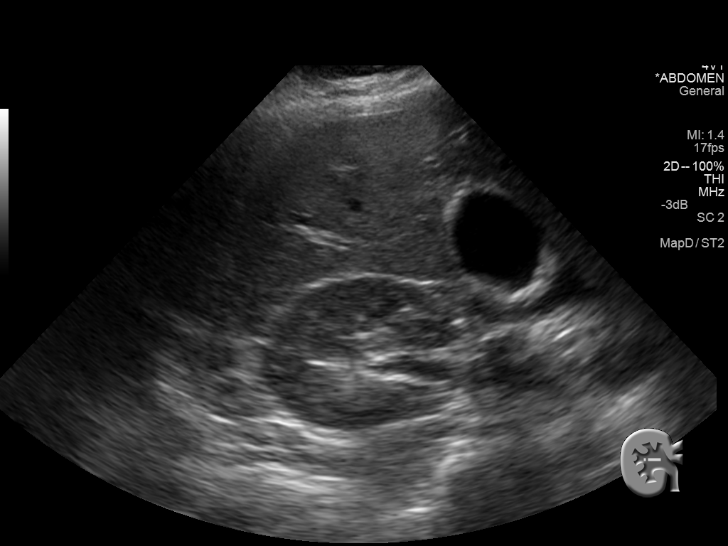
[im 86/188]
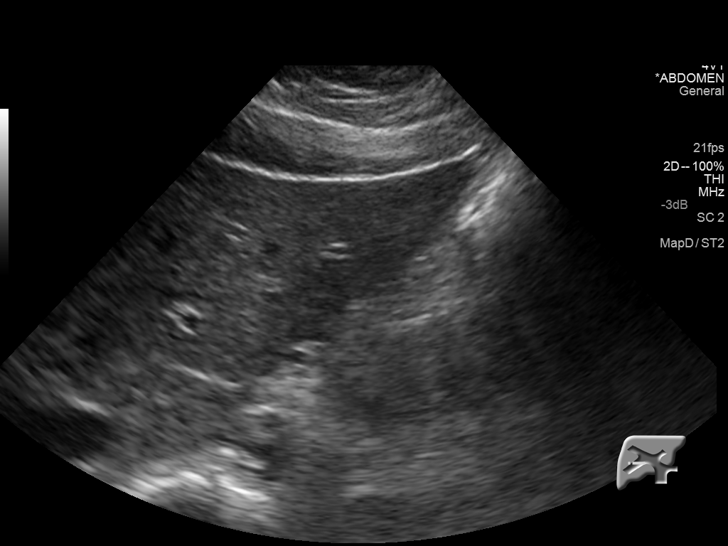
[im 102/188]
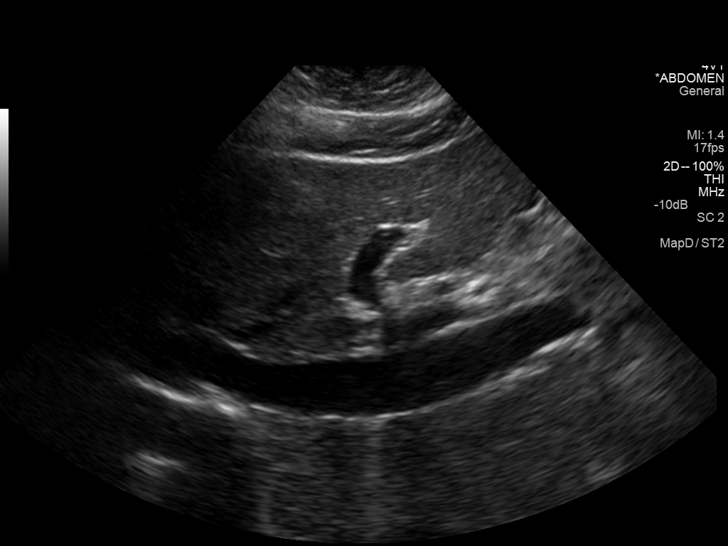
[im 117/188]
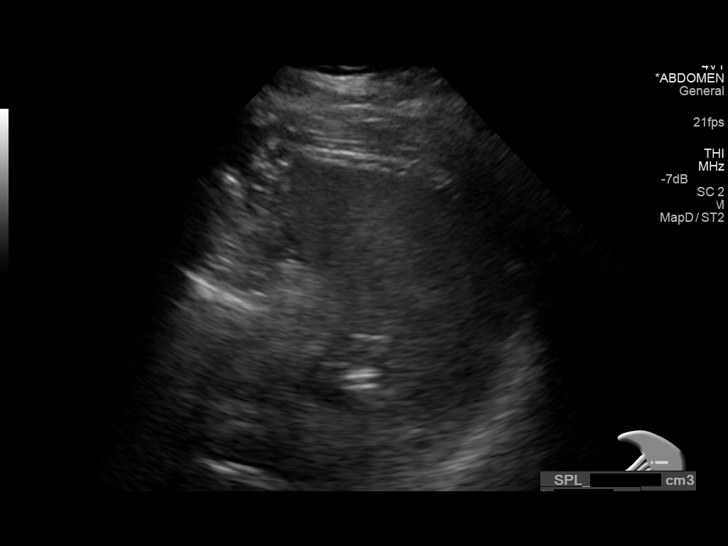
[im 125/188]
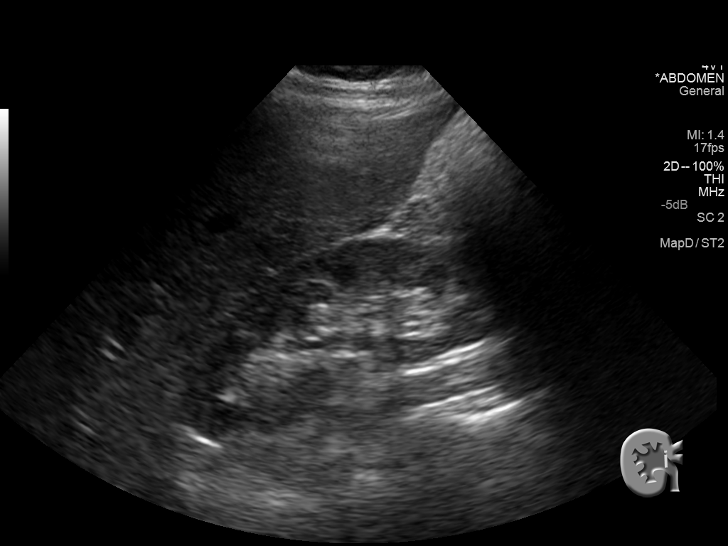
[im 141/188]
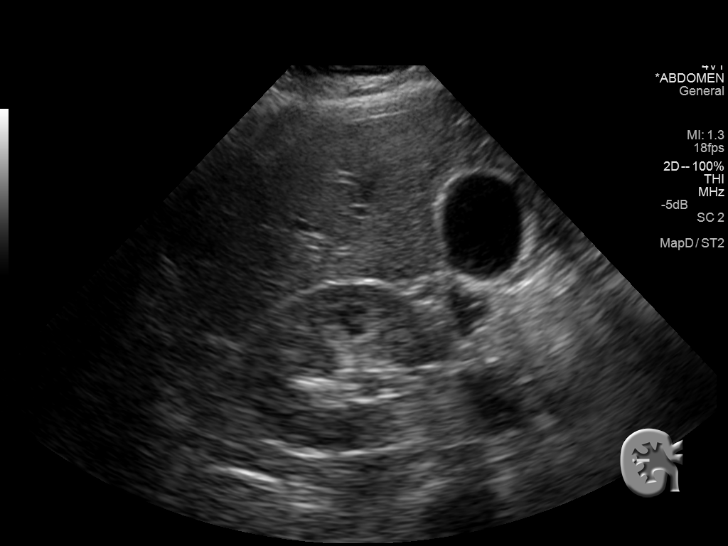
[im 156/188]
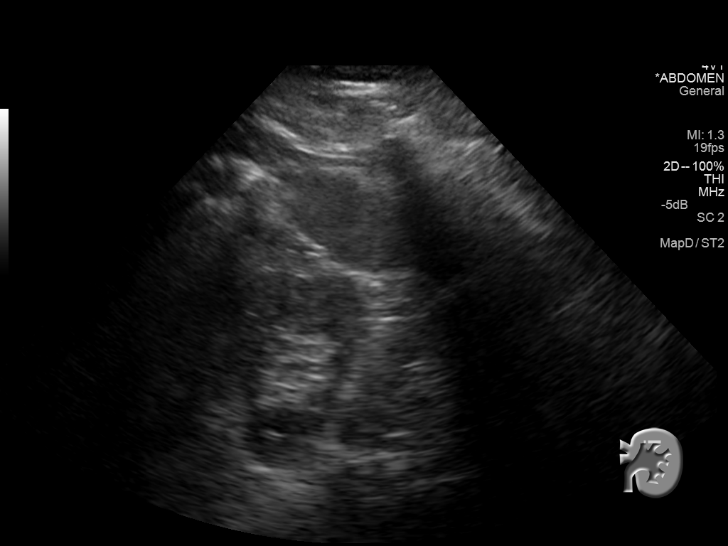
[im 172/188]
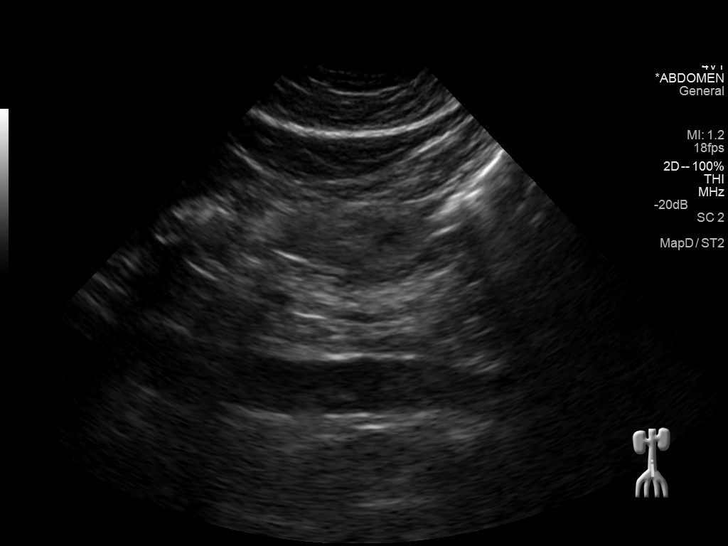
[im 188/188]
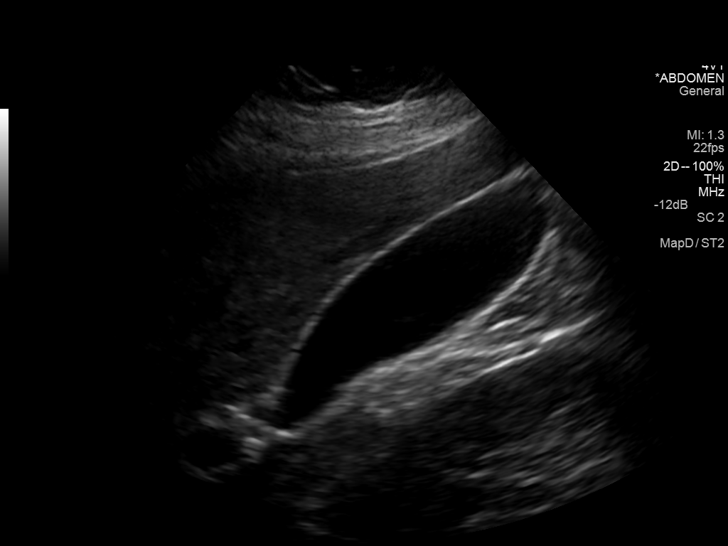

[14 of 25 positions shown; findings below may reference images not displayed]

FINDINGS: Delay between scan time and reporting due to PACS IT factors.

Gallbladder: No gallstones or wall thickening visualized. No
sonographic Murphy sign noted.

Common bile duct: Diameter: 3 mm

Liver: No focal lesion identified. Within normal limits in
parenchymal echogenicity. Antegrade flow in the imaged portal venous
system.

IVC: No abnormality visualized.

Pancreas: Visualized portion unremarkable.

Spleen: Size and appearance within normal limits.

Right Kidney: Length: 10 cm. Echogenicity within normal limits. No
mass or hydronephrosis visualized.

Left Kidney: Length: 11 cm. Echogenicity within normal limits. No
mass or hydronephrosis visualized.

Abdominal aorta: No aneurysm visualized.

Other findings: Asymmetric hypoechoic appearance at the left basilar
chest.
IMPRESSION: 1. Normal abdominal imaging.
2. Possible left pleural effusion.

## 2016-08-30 ENCOUNTER — Emergency Department: Payer: Self-pay

## 2016-08-30 ENCOUNTER — Encounter: Payer: Self-pay | Admitting: Emergency Medicine

## 2016-08-30 ENCOUNTER — Emergency Department
Admission: EM | Admit: 2016-08-30 | Discharge: 2016-08-30 | Disposition: A | Discharge status: Home / Self Care | Payer: Self-pay | Attending: Emergency Medicine | Admitting: Emergency Medicine

## 2016-08-30 DIAGNOSIS — J45909 Unspecified asthma, uncomplicated: Secondary | ICD-10-CM | POA: Insufficient documentation

## 2016-08-30 DIAGNOSIS — Z79899 Other long term (current) drug therapy: Secondary | ICD-10-CM | POA: Insufficient documentation

## 2016-08-30 DIAGNOSIS — R1033 Periumbilical pain: Secondary | ICD-10-CM | POA: Insufficient documentation

## 2016-08-30 DIAGNOSIS — K2900 Acute gastritis without bleeding: Secondary | ICD-10-CM | POA: Insufficient documentation

## 2016-08-30 DIAGNOSIS — K219 Gastro-esophageal reflux disease without esophagitis: Secondary | ICD-10-CM | POA: Insufficient documentation

## 2016-08-30 LAB — COMPREHENSIVE METABOLIC PANEL
ALT: 15 U/L (ref 14–54)
AST: 16 U/L (ref 15–41)
Albumin: 3.9 g/dL (ref 3.5–5.0)
Alkaline Phosphatase: 54 U/L (ref 38–126)
Anion gap: 7 (ref 5–15)
BILIRUBIN TOTAL: 0.6 mg/dL (ref 0.3–1.2)
BUN: 10 mg/dL (ref 6–20)
CHLORIDE: 106 mmol/L (ref 101–111)
CO2: 25 mmol/L (ref 22–32)
CREATININE: 0.67 mg/dL (ref 0.44–1.00)
Calcium: 9.2 mg/dL (ref 8.9–10.3)
Glucose, Bld: 106 mg/dL — ABNORMAL HIGH (ref 65–99)
Potassium: 4 mmol/L (ref 3.5–5.1)
Sodium: 138 mmol/L (ref 135–145)
TOTAL PROTEIN: 7.8 g/dL (ref 6.5–8.1)

## 2016-08-30 LAB — CBC
HEMATOCRIT: 37.7 % (ref 35.0–47.0)
Hemoglobin: 13.1 g/dL (ref 12.0–16.0)
MCH: 29.5 pg (ref 26.0–34.0)
MCHC: 34.6 g/dL (ref 32.0–36.0)
MCV: 85.1 fL (ref 80.0–100.0)
PLATELETS: 299 10*3/uL (ref 150–440)
RBC: 4.43 MIL/uL (ref 3.80–5.20)
RDW: 13.7 % (ref 11.5–14.5)
WBC: 14.3 10*3/uL — ABNORMAL HIGH (ref 3.6–11.0)

## 2016-08-30 LAB — URINALYSIS, COMPLETE (UACMP) WITH MICROSCOPIC
BILIRUBIN URINE: NEGATIVE
Glucose, UA: NEGATIVE mg/dL
Hgb urine dipstick: NEGATIVE
Ketones, ur: NEGATIVE mg/dL
Leukocytes, UA: NEGATIVE
Nitrite: NEGATIVE
PH: 7 (ref 5.0–8.0)
Protein, ur: NEGATIVE mg/dL
SPECIFIC GRAVITY, URINE: 1.012 (ref 1.005–1.030)

## 2016-08-30 LAB — LIPASE, BLOOD: LIPASE: 23 U/L (ref 11–51)

## 2016-08-30 LAB — PREGNANCY, URINE: PREG TEST UR: NEGATIVE

## 2016-08-30 MED ORDER — ONDANSETRON HCL 4 MG/2ML IJ SOLN
4.0000 mg | Freq: Once | INTRAMUSCULAR | Status: AC
  Administered 2016-08-30: 4 mg via INTRAVENOUS
  Filled 2016-08-30: qty 2

## 2016-08-30 MED ORDER — FAMOTIDINE IN NACL 20-0.9 MG/50ML-% IV SOLN
20.0000 mg | Freq: Once | INTRAVENOUS | Status: AC
  Administered 2016-08-30: 20 mg via INTRAVENOUS
  Filled 2016-08-30: qty 50

## 2016-08-30 MED ORDER — SODIUM CHLORIDE 0.9 % IV BOLUS (SEPSIS)
1000.0000 mL | Freq: Once | INTRAVENOUS | Status: AC
  Administered 2016-08-30: 1000 mL via INTRAVENOUS

## 2016-08-30 MED ORDER — ONDANSETRON HCL 4 MG PO TABS: 4.0000 mg | ORAL_TABLET | Freq: Three times a day (TID) | ORAL | 0 refills | Status: DC | PRN

## 2016-08-30 MED ORDER — ACETAMINOPHEN 500 MG PO TABS
1000.0000 mg | ORAL_TABLET | Freq: Once | ORAL | Status: AC
  Administered 2016-08-30: 1000 mg via ORAL
  Filled 2016-08-30: qty 2

## 2016-08-30 MED ORDER — IOPAMIDOL (ISOVUE-300) INJECTION 61%
100.0000 mL | Freq: Once | INTRAVENOUS | Status: AC | PRN
  Administered 2016-08-30: 100 mL via INTRAVENOUS
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
## 2016-08-30 NOTE — Discharge Instructions (Addendum)
From Dr. Don Perking: You have been seen in the Emergency Department (ED) for abdominal pain.  Your evaluation did not identify a clear cause of your symptoms but was generally reassuring.  Abdominal pain has many possible causes. Some aren't serious and get better on their own in a few days. Others need more testing and treatment. If your pain continues or gets worse, you need to be rechecked and may need more tests to find out what is wrong. You may need surgery to correct the problem.   Follow up with your doctor in 12-24 hours if you are still having abdominal pain. Otherwise follow up in 1-3 days for a re-check  Don't ignore new symptoms, such as fever, nausea and vomiting, new or worsening abdominal pain, urination problems, bloody diarrhea or bloody stools, black tarry stools, uncontrollable nausea and vomiting, and dizziness. These may be signs of a more serious problem. If you develop any of these you should be seen by your doctor immediately or return to the ED.   How can you care for yourself at home?  Rest until you feel better.  To prevent dehydration, drink plenty of fluids, enough so that your urine is light yellow or clear like water. Choose water and other caffeine-free clear liquids until you feel better. If you have kidney, heart, or liver disease and have to limit fluids, talk with your doctor before you increase the amount of fluids you drink.  If your stomach is upset, eat mild foods, such as rice, dry toast or crackers, bananas, and applesauce. Try eating several small meals instead of two or three large ones.  Wait until 48 hours after all symptoms have gone away before you have spicy foods, alcohol, and drinks that contain caffeine.  Do not eat foods that are high in fat.  Avoid anti-inflammatory medicines such as aspirin, ibuprofen (Advil, Motrin), and naproxen (Aleve). These can cause stomach upset. Talk to your doctor if you take daily aspirin for another health  problem.  When should you call for help?  Call 911 anytime you think you may need emergency care. For example, call if:  You passed out (lost consciousness).  You pass maroon or very bloody stools.  You vomit blood or what looks like coffee grounds.  You have new, severe belly pain.  Call your doctor now or seek immediate medical care if:  Your pain gets worse, especially if it becomes focused in one area of your belly.  You have a new or higher fever.  Your stools are black and look like tar, or they have streaks of blood.  You have unexpected vaginal bleeding.  You have symptoms of a urinary tract infection. These may include:  Pain when you urinate.  Urinating more often than usual.  Blood in your urine. You are dizzy or lightheaded, or you feel like you may faint. Watch closely for changes in your health, and be sure to contact your doctor if:  You are not getting better after 1 day (24 hours).

## 2016-08-30 NOTE — ED Triage Notes (Signed)
Pt in with co mid abd pain that started after eating [employer]. States feels like burning, has nausea with no vomiting.

## 2016-08-30 NOTE — ED Provider Notes (Signed)
[facility]  I accepted care from Dr. Don Perking ____________________________________________    LABS (pertinent positives/negatives)  Labs Reviewed  CBC - Abnormal; Notable for the following:       Result Value   WBC 14.3 (*)    All other components within normal limits  COMPREHENSIVE METABOLIC PANEL - Abnormal; Notable for the following:    Glucose, Bld 106 (*)    All other components within normal limits  URINALYSIS, COMPLETE (UACMP) WITH MICROSCOPIC - Abnormal; Notable for the following:    Color, Urine YELLOW (*)    APPearance HAZY (*)    Bacteria, UA RARE (*)    Squamous Epithelial / LPF 6-30 (*)    All other components within normal limits  URINE CULTURE  LIPASE, BLOOD  PREGNANCY, URINE  POC URINE PREG, ED     ____________________________________________    RADIOLOGY All xrays were viewed by me. Imaging interpreted by radiologist.  Pelvic and transvaginal ultrasound:  IMPRESSION: No acute findings. No evidence of ovarian torsion. Trace free fluid, likely physiologic.  ____________________________________________   PROCEDURES  Procedure(s) performed: None  Critical Care performed: None  ____________________________________________   INITIAL IMPRESSION / ASSESSMENT AND PLAN / ED COURSE   Pertinent labs & imaging results that were available during my care of the patient were reviewed by me and considered in my medical decision making (see chart for details).  Dr. Don Perking described to me that her impression initially based on patient complaint description was potentially gastritis versus GERD, but pain seemed to be little lower and with elevated white blood cell count, had an CT which showed no emergent findings, and is now awaiting pelvic/transvaginal ultrasound.  Plan is that if reassuring, with discharge with likely diagnosis gastritis/GERD for outpatient follow-up.  Pelvic ultrasound is without emergency findings.  I updated  patient and completed Dr. Don Perking discharge instructions for patient.  CONSULTATIONS: None    Patient / Family / Caregiver informed of clinical course, medical decision-making process, and agree with plan.   I discussed return precautions, follow-up instructions, and discharged instructions with patient and/or family.    ____________________________________________   FINAL CLINICAL IMPRESSION(S) / ED DIAGNOSES  Final diagnoses:  Periumbilical abdominal pain  Acute gastritis without hemorrhage, unspecified gastritis type  Gastroesophageal reflux disease, esophagitis presence not specified        Governor Rooks, MD 08/30/16 [phone]

## 2016-08-31 LAB — URINE CULTURE

## 2016-09-13 ENCOUNTER — Encounter: Payer: Self-pay | Admitting: Emergency Medicine

## 2016-09-13 ENCOUNTER — Emergency Department
Admission: EM | Admit: 2016-09-13 | Discharge: 2016-09-13 | Disposition: A | Discharge status: Home / Self Care | Payer: BLUE CROSS/BLUE SHIELD | Attending: Emergency Medicine | Admitting: Emergency Medicine

## 2016-09-13 DIAGNOSIS — J45909 Unspecified asthma, uncomplicated: Secondary | ICD-10-CM | POA: Insufficient documentation

## 2016-09-13 DIAGNOSIS — Z79899 Other long term (current) drug therapy: Secondary | ICD-10-CM | POA: Insufficient documentation

## 2016-09-13 DIAGNOSIS — T7840XA Allergy, unspecified, initial encounter: Secondary | ICD-10-CM | POA: Insufficient documentation

## 2016-09-13 DIAGNOSIS — I10 Essential (primary) hypertension: Secondary | ICD-10-CM | POA: Diagnosis not present

## 2016-09-13 DIAGNOSIS — L509 Urticaria, unspecified: Secondary | ICD-10-CM | POA: Diagnosis present

## 2016-09-13 MED ORDER — METHYLPREDNISOLONE SODIUM SUCC 125 MG IJ SOLR
125.0000 mg | Freq: Once | INTRAMUSCULAR | Status: AC
  Administered 2016-09-13: 125 mg via INTRAVENOUS
  Filled 2016-09-13: qty 2

## 2016-09-13 MED ORDER — DIPHENHYDRAMINE HCL 50 MG/ML IJ SOLN
25.0000 mg | Freq: Once | INTRAMUSCULAR | Status: AC
  Administered 2016-09-13: 25 mg via INTRAVENOUS
  Filled 2016-09-13: qty 1

## 2016-09-13 MED ORDER — FAMOTIDINE IN NACL 20-0.9 MG/50ML-% IV SOLN
20.0000 mg | Freq: Once | INTRAVENOUS | Status: AC
  Administered 2016-09-13: 20 mg via INTRAVENOUS
  Filled 2016-09-13: qty 50

## 2016-09-13 MED ORDER — PREDNISONE 50 MG PO TABS: 50.0000 mg | ORAL_TABLET | Freq: Every day | ORAL | 0 refills | Status: DC
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
## 2016-09-13 NOTE — ED Triage Notes (Signed)
Patient to ER with hives to groin, flanks, neck, face, and arms. Patient states she only is aware of strawberry allergy and has not had any strawberries. States hives started immediately after coming inside after mowing grass. Denies difficulty breathing, but reports sore throat.

## 2016-09-13 NOTE — ED Notes (Signed)
Signature pad not working in room, pt states discharge understanding, denies any needs or questions

## 2017-02-20 ENCOUNTER — Emergency Department: Payer: BLUE CROSS/BLUE SHIELD

## 2017-02-20 ENCOUNTER — Emergency Department
Admission: EM | Admit: 2017-02-20 | Discharge: 2017-02-20 | Disposition: A | Discharge status: Home / Self Care | Payer: BLUE CROSS/BLUE SHIELD | Attending: Emergency Medicine | Admitting: Emergency Medicine

## 2017-02-20 ENCOUNTER — Encounter: Payer: Self-pay | Admitting: Emergency Medicine

## 2017-02-20 ENCOUNTER — Other Ambulatory Visit: Payer: Self-pay

## 2017-02-20 DIAGNOSIS — M25562 Pain in left knee: Secondary | ICD-10-CM | POA: Insufficient documentation

## 2017-02-20 DIAGNOSIS — I10 Essential (primary) hypertension: Secondary | ICD-10-CM | POA: Insufficient documentation

## 2017-02-20 DIAGNOSIS — Z79899 Other long term (current) drug therapy: Secondary | ICD-10-CM | POA: Diagnosis not present

## 2017-02-20 DIAGNOSIS — J45909 Unspecified asthma, uncomplicated: Secondary | ICD-10-CM | POA: Insufficient documentation

## 2017-02-20 MED ORDER — KETOROLAC TROMETHAMINE 30 MG/ML IJ SOLN
30.0000 mg | Freq: Once | INTRAMUSCULAR | Status: AC
  Administered 2017-02-20: 30 mg via INTRAMUSCULAR
  Filled 2017-02-20: qty 1

## 2017-02-20 MED ORDER — KETOROLAC TROMETHAMINE 10 MG PO TABS: 10.0000 mg | ORAL_TABLET | Freq: Four times a day (QID) | ORAL | 0 refills | Status: DC | PRN
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
## 2017-02-20 NOTE — ED Notes (Signed)
See triage note  Presents with pain to left leg  States pain starts at knee and moves up her leg  Felt a pop to knee and developed increased pain  Ambulates well to treatment room

## 2017-02-20 NOTE — ED Triage Notes (Signed)
Pt with left knee swelling.

## 2017-02-23 ENCOUNTER — Encounter: Payer: Self-pay | Admitting: Emergency Medicine

## 2017-02-23 ENCOUNTER — Emergency Department
Admission: EM | Admit: 2017-02-23 | Discharge: 2017-02-23 | Disposition: A | Discharge status: Home / Self Care | Payer: BLUE CROSS/BLUE SHIELD | Attending: Emergency Medicine | Admitting: Emergency Medicine

## 2017-02-23 ENCOUNTER — Other Ambulatory Visit: Payer: Self-pay

## 2017-02-23 DIAGNOSIS — I10 Essential (primary) hypertension: Secondary | ICD-10-CM | POA: Insufficient documentation

## 2017-02-23 DIAGNOSIS — Z79899 Other long term (current) drug therapy: Secondary | ICD-10-CM | POA: Diagnosis not present

## 2017-02-23 DIAGNOSIS — M25562 Pain in left knee: Secondary | ICD-10-CM

## 2017-02-23 DIAGNOSIS — J45909 Unspecified asthma, uncomplicated: Secondary | ICD-10-CM | POA: Insufficient documentation

## 2017-02-23 MED ORDER — NAPROXEN 500 MG PO TABS: 500.0000 mg | ORAL_TABLET | Freq: Two times a day (BID) | ORAL | 0 refills | Status: DC

## 2017-02-23 NOTE — Discharge Instructions (Signed)
Please see the orthopedic doctor or the primary care provider for further evaluation of your knee pain.

## 2017-02-23 NOTE — ED Notes (Signed)
States she was given an ace wrap and crutches on Friday  But not using either on arrival

## 2017-02-23 NOTE — ED Triage Notes (Signed)
Presents with pain to left knee   Was seen on Friday for same  States pain is worse  And is now moving up her leg  Ambulates with slight limp d/t pain
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
## 2017-03-08 ENCOUNTER — Other Ambulatory Visit: Payer: Self-pay

## 2017-03-08 ENCOUNTER — Emergency Department (HOSPITAL_COMMUNITY): Payer: BLUE CROSS/BLUE SHIELD

## 2017-03-08 ENCOUNTER — Encounter (HOSPITAL_COMMUNITY): Payer: Self-pay | Admitting: Emergency Medicine

## 2017-03-08 ENCOUNTER — Emergency Department (HOSPITAL_COMMUNITY)
Admission: EM | Admit: 2017-03-08 | Discharge: 2017-03-08 | Disposition: A | Discharge status: Home / Self Care | Payer: BLUE CROSS/BLUE SHIELD | Attending: Emergency Medicine | Admitting: Emergency Medicine

## 2017-03-08 DIAGNOSIS — J45909 Unspecified asthma, uncomplicated: Secondary | ICD-10-CM | POA: Diagnosis not present

## 2017-03-08 DIAGNOSIS — R109 Unspecified abdominal pain: Secondary | ICD-10-CM | POA: Insufficient documentation

## 2017-03-08 DIAGNOSIS — I10 Essential (primary) hypertension: Secondary | ICD-10-CM | POA: Diagnosis not present

## 2017-03-08 DIAGNOSIS — M25562 Pain in left knee: Secondary | ICD-10-CM | POA: Diagnosis not present

## 2017-03-08 DIAGNOSIS — Z79899 Other long term (current) drug therapy: Secondary | ICD-10-CM | POA: Insufficient documentation

## 2017-03-08 DIAGNOSIS — R202 Paresthesia of skin: Secondary | ICD-10-CM | POA: Diagnosis not present

## 2017-03-08 NOTE — Discharge Instructions (Signed)
Please follow-up with your orthopedic doctor at your scheduled appointment for further evaluation and treatment.  Wear your brace at all times.  Use ice 3-4 times daily alternating 20 minutes on, 20 minutes off.  Please return to the emergency department if you develop any new or worsening symptoms.
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
## 2017-03-08 NOTE — ED Triage Notes (Signed)
Pt reports having pain and popping sound to left knee after leaving brace off of knee for extended amount of time. Pt reports worsening pain with standing. Pulses present distally.

## 2017-04-21 ENCOUNTER — Other Ambulatory Visit: Payer: Self-pay

## 2017-04-21 ENCOUNTER — Emergency Department
Admission: EM | Admit: 2017-04-21 | Discharge: 2017-04-21 | Disposition: A | Discharge status: Home / Self Care | Payer: BLUE CROSS/BLUE SHIELD | Attending: Student in an Organized Health Care Education/Training Program | Admitting: Student in an Organized Health Care Education/Training Program

## 2017-04-21 DIAGNOSIS — R112 Nausea with vomiting, unspecified: Secondary | ICD-10-CM | POA: Insufficient documentation

## 2017-04-21 DIAGNOSIS — I10 Essential (primary) hypertension: Secondary | ICD-10-CM | POA: Insufficient documentation

## 2017-04-21 DIAGNOSIS — J45909 Unspecified asthma, uncomplicated: Secondary | ICD-10-CM | POA: Diagnosis not present

## 2017-04-21 DIAGNOSIS — Z79899 Other long term (current) drug therapy: Secondary | ICD-10-CM | POA: Diagnosis not present

## 2017-04-21 LAB — COMPREHENSIVE METABOLIC PANEL
ALK PHOS: 55 U/L (ref 38–126)
ALT: 16 U/L (ref 14–54)
AST: 23 U/L (ref 15–41)
Albumin: 3.7 g/dL (ref 3.5–5.0)
Anion gap: 9 (ref 5–15)
BUN: 13 mg/dL (ref 6–20)
CALCIUM: 8.8 mg/dL — AB (ref 8.9–10.3)
CHLORIDE: 103 mmol/L (ref 101–111)
CO2: 23 mmol/L (ref 22–32)
Creatinine, Ser: 0.66 mg/dL (ref 0.44–1.00)
Glucose, Bld: 127 mg/dL — ABNORMAL HIGH (ref 65–99)
Potassium: 3.3 mmol/L — ABNORMAL LOW (ref 3.5–5.1)
Sodium: 135 mmol/L (ref 135–145)
Total Bilirubin: 0.8 mg/dL (ref 0.3–1.2)
Total Protein: 8.1 g/dL (ref 6.5–8.1)

## 2017-04-21 LAB — URINALYSIS, COMPLETE (UACMP) WITH MICROSCOPIC
Bacteria, UA: NONE SEEN
Bilirubin Urine: NEGATIVE
GLUCOSE, UA: NEGATIVE mg/dL
Hgb urine dipstick: NEGATIVE
Ketones, ur: NEGATIVE mg/dL
Leukocytes, UA: NEGATIVE
Nitrite: NEGATIVE
PH: 5 (ref 5.0–8.0)
Protein, ur: NEGATIVE mg/dL
Specific Gravity, Urine: 1.03 (ref 1.005–1.030)

## 2017-04-21 LAB — CBC
HCT: 40.7 % (ref 35.0–47.0)
Hemoglobin: 13.6 g/dL (ref 12.0–16.0)
MCH: 28.8 pg (ref 26.0–34.0)
MCHC: 33.4 g/dL (ref 32.0–36.0)
MCV: 86 fL (ref 80.0–100.0)
PLATELETS: 293 10*3/uL (ref 150–440)
RBC: 4.73 MIL/uL (ref 3.80–5.20)
RDW: 13.9 % (ref 11.5–14.5)
WBC: 14.6 10*3/uL — AB (ref 3.6–11.0)

## 2017-04-21 LAB — POC URINE PREG, ED: Preg Test, Ur: NEGATIVE

## 2017-04-21 LAB — LIPASE, BLOOD: LIPASE: 26 U/L (ref 11–51)

## 2017-04-21 MED ORDER — ONDANSETRON 4 MG PO TBDP
4.0000 mg | ORAL_TABLET | Freq: Once | ORAL | Status: AC | PRN
  Administered 2017-04-21: 4 mg via ORAL
  Filled 2017-04-21: qty 1

## 2017-04-21 MED ORDER — ONDANSETRON 4 MG PO TBDP: 4.0000 mg | ORAL_TABLET | Freq: Three times a day (TID) | ORAL | 0 refills | Status: DC | PRN

## 2017-04-21 NOTE — ED Notes (Signed)

## 2017-04-21 NOTE — ED Triage Notes (Signed)
Pt c/o generalized abd cramping with N/V today. States her children have had the stomach virus recently.

## 2017-04-21 NOTE — Discharge Instructions (Addendum)
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
## 2017-07-01 IMAGING — US US OB COMP LESS 14 WK
1 series · 14 of 28 positions shown · non-contrast
Comparison: 10/24/2014

CLINICAL DATA: Cramping for 1 day in first-trimester pregnancy

EXAM:
OBSTETRIC <14 WK US AND TRANSVAGINAL OB US
TECHNIQUE: Both transabdominal and transvaginal ultrasound examinations were
performed for complete evaluation of the gestation as well as the
maternal uterus, adnexal regions, and pelvic cul-de-sac.
Transvaginal technique was performed to assess early pregnancy.

[Series 1: us ob comp less 14 wk · 0.21mm/px · 53 acquisitions, 14 frames shown]
[im 2/53]
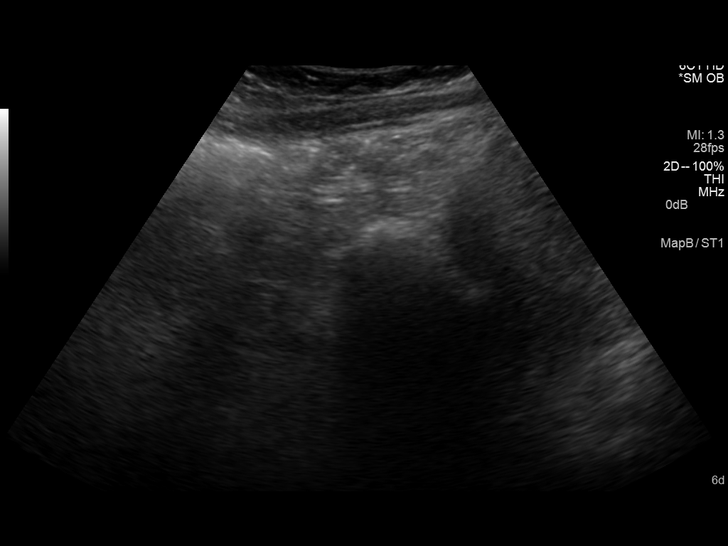
[im 6/53]
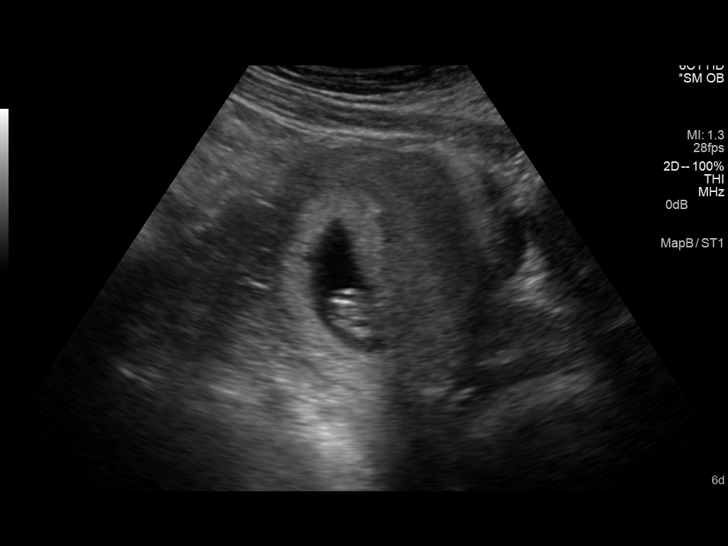
[im 10/53]
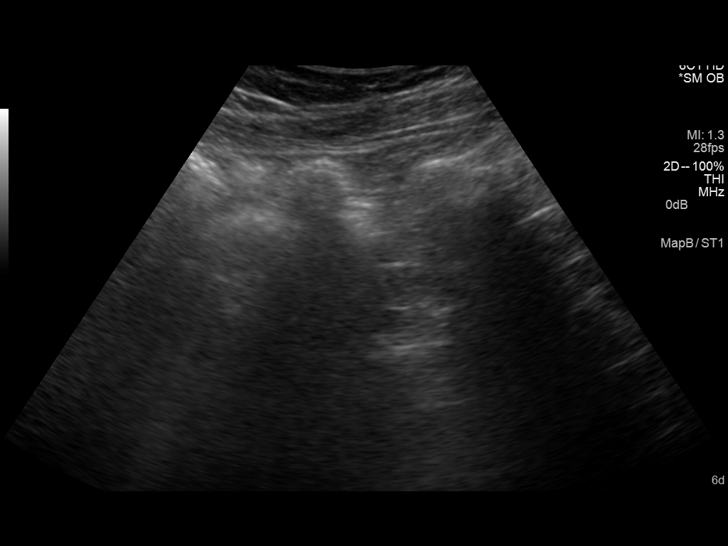
[im 14/53]
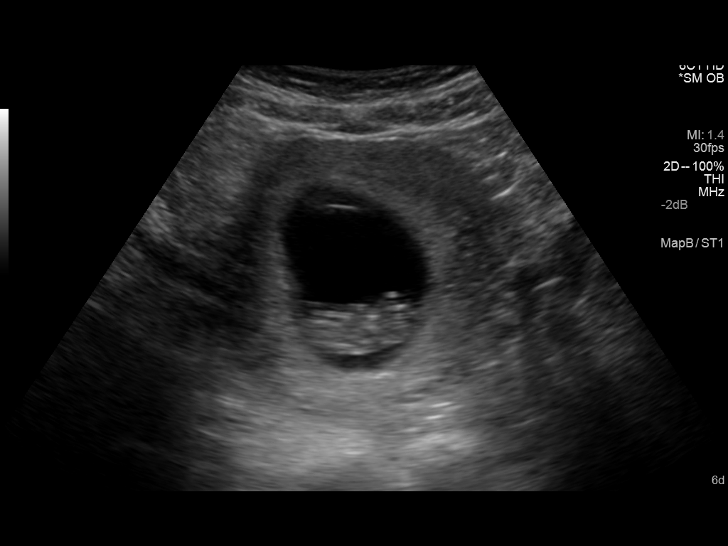
[im 18/53]
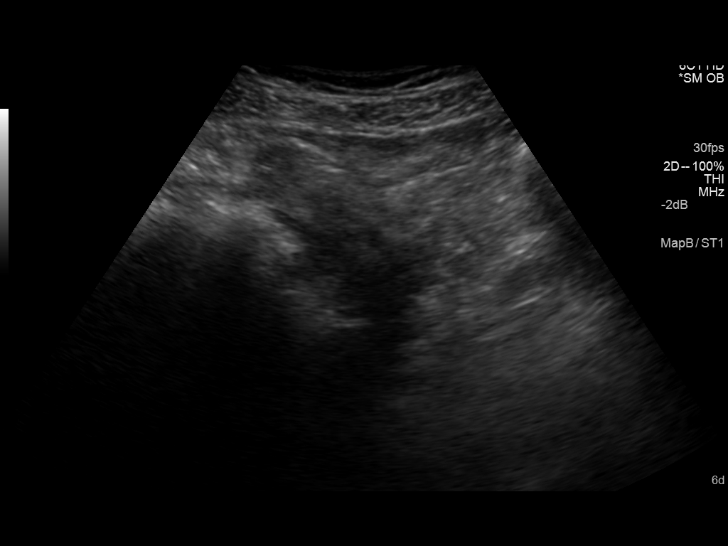
[im 22/53]
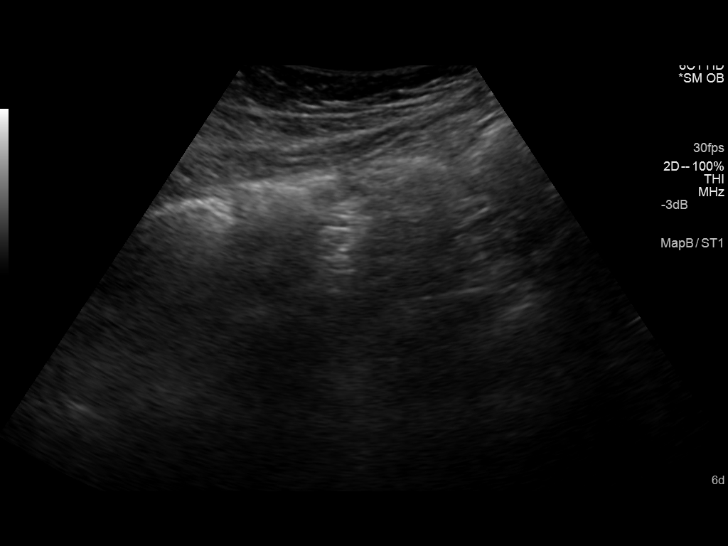
[im 26/53]
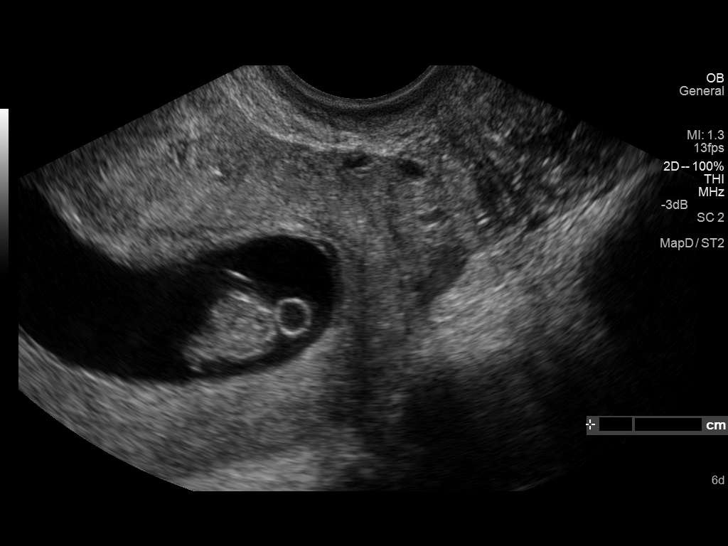
[im 29/53]
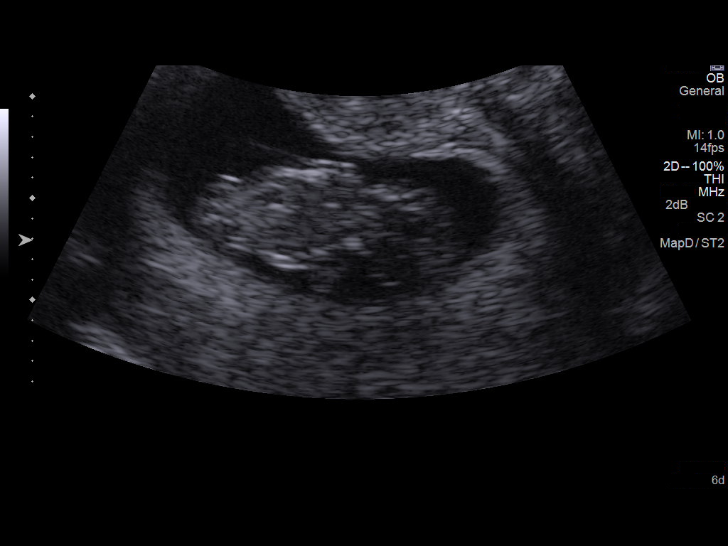
[im 33/53]
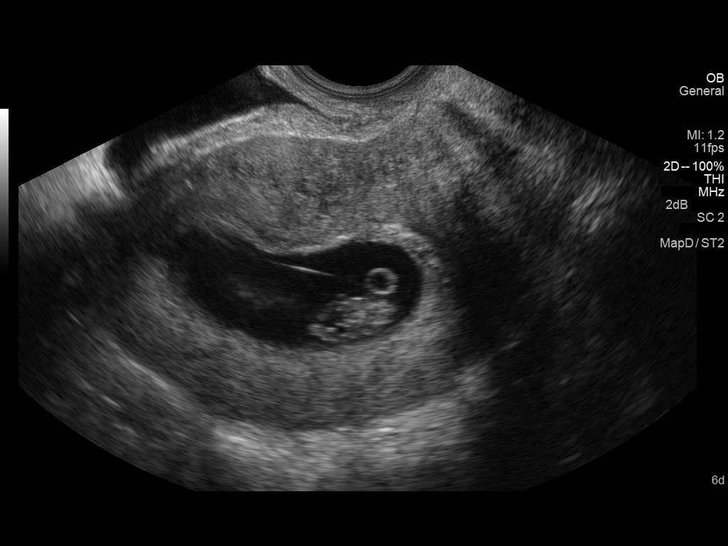
[im 37/53]
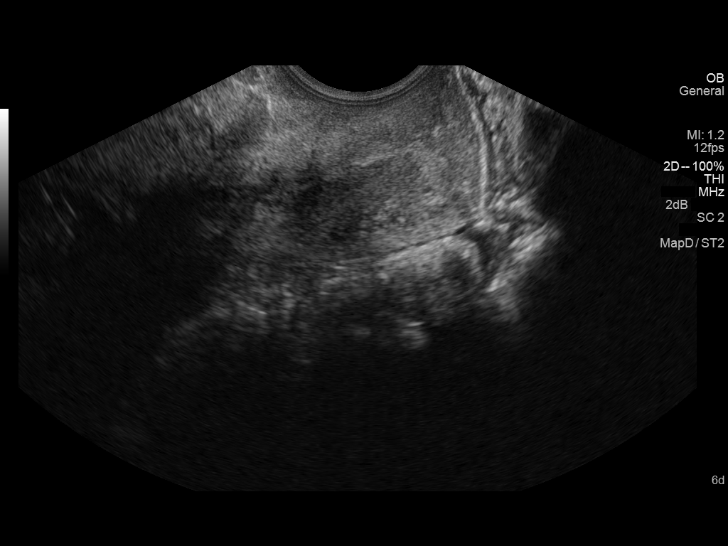
[im 41/53]
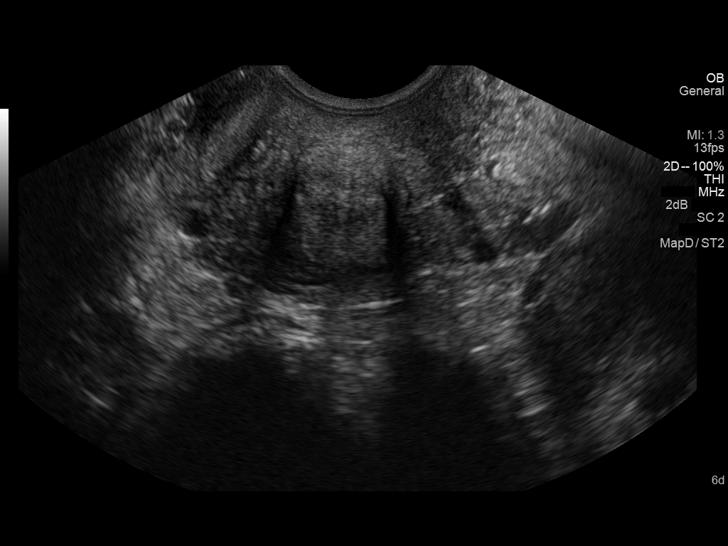
[im 45/53]
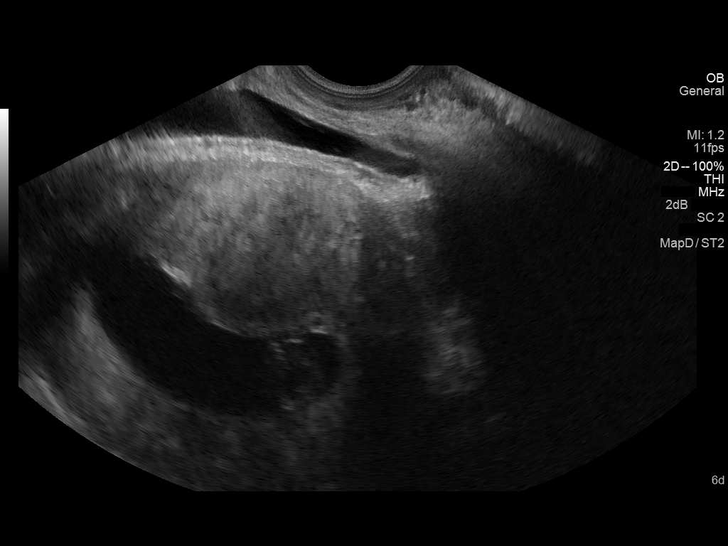
[im 49/53]
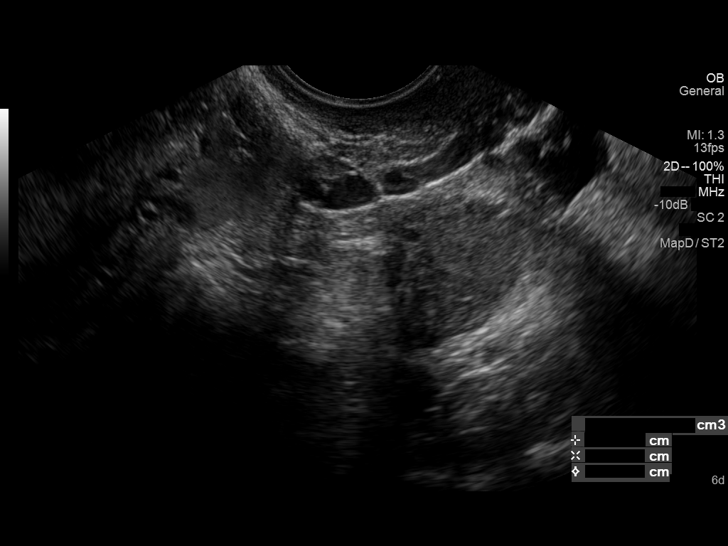
[im 53/53]
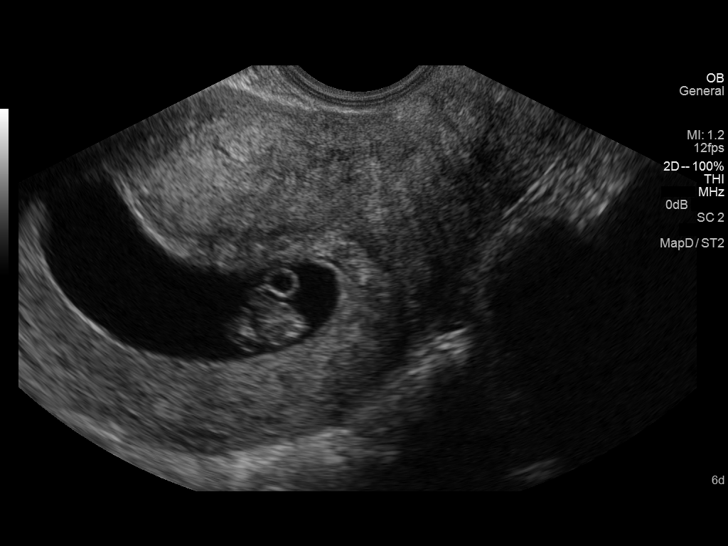

[14 of 28 positions shown; findings below may reference images not displayed]

FINDINGS: Intrauterine gestational sac: Visualized/normal in shape. No
subchorionic fluid.

Yolk sac:  Present

Embryo:  Present

Cardiac Activity: Present

Heart Rate: 168  bpm

CRL:  25.4  mm   9 w   2 d                  US EDC: 06/25/2015

Maternal uterus/adnexae: Normal appearance of the left ovary. Right
ovary not visualized. No adnexal mass. Trace simple pelvic fluid.
IMPRESSION: 1. Single living intrauterine gestation measuring 9 weeks 2 days. No
adverse findings.
2. Trace pelvic fluid.

## 2017-07-24 ENCOUNTER — Encounter: Payer: Self-pay | Admitting: Emergency Medicine

## 2017-07-24 ENCOUNTER — Other Ambulatory Visit: Payer: Self-pay

## 2017-07-24 ENCOUNTER — Emergency Department
Admission: EM | Admit: 2017-07-24 | Discharge: 2017-07-24 | Disposition: A | Discharge status: Home / Self Care | Payer: BLUE CROSS/BLUE SHIELD | Attending: Emergency Medicine | Admitting: Emergency Medicine

## 2017-07-24 DIAGNOSIS — Y999 Unspecified external cause status: Secondary | ICD-10-CM | POA: Insufficient documentation

## 2017-07-24 DIAGNOSIS — Y929 Unspecified place or not applicable: Secondary | ICD-10-CM | POA: Insufficient documentation

## 2017-07-24 DIAGNOSIS — W57XXXA Bitten or stung by nonvenomous insect and other nonvenomous arthropods, initial encounter: Secondary | ICD-10-CM | POA: Diagnosis not present

## 2017-07-24 DIAGNOSIS — S50862A Insect bite (nonvenomous) of left forearm, initial encounter: Secondary | ICD-10-CM | POA: Insufficient documentation

## 2017-07-24 DIAGNOSIS — J45909 Unspecified asthma, uncomplicated: Secondary | ICD-10-CM | POA: Insufficient documentation

## 2017-07-24 DIAGNOSIS — Y939 Activity, unspecified: Secondary | ICD-10-CM | POA: Insufficient documentation

## 2017-07-24 DIAGNOSIS — I10 Essential (primary) hypertension: Secondary | ICD-10-CM | POA: Insufficient documentation

## 2017-07-24 MED ORDER — CALAMINE EX LOTN: 1.0000 "application " | TOPICAL_LOTION | CUTANEOUS | 0 refills | Status: DC | PRN

## 2017-07-24 MED ORDER — HYDROCORTISONE 1 % EX LOTN: 1.0000 "application " | TOPICAL_LOTION | Freq: Two times a day (BID) | CUTANEOUS | 0 refills | Status: DC

## 2017-07-24 NOTE — ED Notes (Signed)
No peripheral IV placed this visit.   Discharge instructions reviewed with patient. Questions fielded by this RN. Patient verbalizes understanding of instructions. Patient discharged home in stable condition per Jackie, PA. No acute distress noted at time of discharge.   

## 2017-07-24 NOTE — ED Notes (Signed)
See triage note  Presents s/p insect bite  Min swelling and some localized redness noted to left wrist
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
## 2017-07-24 NOTE — ED Triage Notes (Signed)
Insect bite to left wrist x 30 minutes.  AAOx3.  Skin warm and dry.  Localized swelling to left wrist.  NAD

## 2017-07-25 ENCOUNTER — Other Ambulatory Visit: Payer: Self-pay

## 2017-07-25 ENCOUNTER — Observation Stay
Admission: EM | Admit: 2017-07-25 | Discharge: 2017-07-26 | Disposition: A | Discharge status: Home / Self Care | Payer: BLUE CROSS/BLUE SHIELD | Attending: Internal Medicine | Admitting: Internal Medicine

## 2017-07-25 ENCOUNTER — Emergency Department: Payer: BLUE CROSS/BLUE SHIELD

## 2017-07-25 ENCOUNTER — Encounter: Payer: Self-pay | Admitting: Emergency Medicine

## 2017-07-25 DIAGNOSIS — M7989 Other specified soft tissue disorders: Secondary | ICD-10-CM | POA: Insufficient documentation

## 2017-07-25 DIAGNOSIS — L0390 Cellulitis, unspecified: Secondary | ICD-10-CM

## 2017-07-25 DIAGNOSIS — Z87828 Personal history of other (healed) physical injury and trauma: Secondary | ICD-10-CM | POA: Insufficient documentation

## 2017-07-25 DIAGNOSIS — W57XXXA Bitten or stung by nonvenomous insect and other nonvenomous arthropods, initial encounter: Secondary | ICD-10-CM | POA: Insufficient documentation

## 2017-07-25 DIAGNOSIS — I10 Essential (primary) hypertension: Secondary | ICD-10-CM | POA: Insufficient documentation

## 2017-07-25 DIAGNOSIS — Z7951 Long term (current) use of inhaled steroids: Secondary | ICD-10-CM | POA: Insufficient documentation

## 2017-07-25 DIAGNOSIS — Z8249 Family history of ischemic heart disease and other diseases of the circulatory system: Secondary | ICD-10-CM | POA: Diagnosis not present

## 2017-07-25 DIAGNOSIS — L03114 Cellulitis of left upper limb: Secondary | ICD-10-CM | POA: Diagnosis not present

## 2017-07-25 DIAGNOSIS — Z79899 Other long term (current) drug therapy: Secondary | ICD-10-CM | POA: Insufficient documentation

## 2017-07-25 DIAGNOSIS — J45909 Unspecified asthma, uncomplicated: Secondary | ICD-10-CM | POA: Insufficient documentation

## 2017-07-25 DIAGNOSIS — L299 Pruritus, unspecified: Secondary | ICD-10-CM | POA: Insufficient documentation

## 2017-07-25 DIAGNOSIS — Z91018 Allergy to other foods: Secondary | ICD-10-CM | POA: Insufficient documentation

## 2017-07-25 LAB — CBC WITH DIFFERENTIAL/PLATELET
Basophils Absolute: 0.1 10*3/uL (ref 0–0.1)
Basophils Relative: 1 %
Eosinophils Absolute: 0.5 10*3/uL (ref 0–0.7)
Eosinophils Relative: 4 %
HEMATOCRIT: 37.3 % (ref 35.0–47.0)
Hemoglobin: 12.6 g/dL (ref 12.0–16.0)
LYMPHS ABS: 3.1 10*3/uL (ref 1.0–3.6)
LYMPHS PCT: 26 %
MCH: 29.2 pg (ref 26.0–34.0)
MCHC: 33.8 g/dL (ref 32.0–36.0)
MCV: 86.3 fL (ref 80.0–100.0)
MONOS PCT: 8 %
Monocytes Absolute: 0.9 10*3/uL (ref 0.2–0.9)
NEUTROS ABS: 7.7 10*3/uL — AB (ref 1.4–6.5)
Neutrophils Relative %: 63 %
Platelets: 298 10*3/uL (ref 150–440)
RBC: 4.33 MIL/uL (ref 3.80–5.20)
RDW: 14.1 % (ref 11.5–14.5)
WBC: 12.3 10*3/uL — ABNORMAL HIGH (ref 3.6–11.0)

## 2017-07-25 LAB — PREGNANCY, URINE: Preg Test, Ur: NEGATIVE

## 2017-07-25 LAB — BASIC METABOLIC PANEL
Anion gap: 8 (ref 5–15)
BUN: 14 mg/dL (ref 6–20)
CHLORIDE: 108 mmol/L (ref 98–111)
CO2: 24 mmol/L (ref 22–32)
Calcium: 8.5 mg/dL — ABNORMAL LOW (ref 8.9–10.3)
Creatinine, Ser: 0.7 mg/dL (ref 0.44–1.00)
GFR calc non Af Amer: >60 mL/min (ref >60)
Glucose, Bld: 101 mg/dL — ABNORMAL HIGH (ref 70–99)
Potassium: 3.7 mmol/L (ref 3.5–5.1)
Sodium: 140 mmol/L (ref 135–145)

## 2017-07-25 MED ORDER — ENOXAPARIN SODIUM 40 MG/0.4ML ~~LOC~~ SOLN
40.0000 mg | SUBCUTANEOUS | Status: DC
  Administered 2017-07-25: 40 mg via SUBCUTANEOUS
  Filled 2017-07-25: qty 0.4

## 2017-07-25 MED ORDER — CLINDAMYCIN PHOSPHATE 600 MG/50ML IV SOLN
600.0000 mg | Freq: Once | INTRAVENOUS | Status: AC
  Administered 2017-07-25: 600 mg via INTRAVENOUS
  Filled 2017-07-25: qty 50

## 2017-07-25 MED ORDER — BUDESONIDE 0.5 MG/2ML IN SUSP
0.5000 mg | Freq: Two times a day (BID) | RESPIRATORY_TRACT | Status: DC
  Administered 2017-07-25 – 2017-07-26 (×2): 0.5 mg via RESPIRATORY_TRACT
  Filled 2017-07-25 (×2): qty 2

## 2017-07-25 MED ORDER — ONDANSETRON HCL 4 MG/2ML IJ SOLN: 4.0000 mg | Freq: Four times a day (QID) | INTRAMUSCULAR | Status: DC | PRN

## 2017-07-25 MED ORDER — DOCUSATE SODIUM 100 MG PO CAPS
100.0000 mg | ORAL_CAPSULE | Freq: Two times a day (BID) | ORAL | Status: DC
  Administered 2017-07-25 – 2017-07-26 (×2): 100 mg via ORAL
  Filled 2017-07-25 (×2): qty 1

## 2017-07-25 MED ORDER — TRAZODONE HCL 50 MG PO TABS: 25.0000 mg | ORAL_TABLET | Freq: Every evening | ORAL | Status: DC | PRN

## 2017-07-25 MED ORDER — CLINDAMYCIN PHOSPHATE 300 MG/50ML IV SOLN
300.0000 mg | Freq: Four times a day (QID) | INTRAVENOUS | Status: DC
  Administered 2017-07-25 – 2017-07-26 (×4): 300 mg via INTRAVENOUS
  Filled 2017-07-25 (×6): qty 50

## 2017-07-25 MED ORDER — HYDROCORTISONE 1 % EX CREA
TOPICAL_CREAM | Freq: Two times a day (BID) | CUTANEOUS | Status: DC
  Administered 2017-07-25 – 2017-07-26 (×2): via TOPICAL
  Filled 2017-07-25: qty 28

## 2017-07-25 MED ORDER — ALBUTEROL SULFATE (2.5 MG/3ML) 0.083% IN NEBU
3.0000 mL | INHALATION_SOLUTION | RESPIRATORY_TRACT | Status: DC | PRN
Start: 2017-07-25 — End: 2017-07-26

## 2017-07-25 MED ORDER — ACETAMINOPHEN 325 MG PO TABS
650.0000 mg | ORAL_TABLET | Freq: Four times a day (QID) | ORAL | Status: DC | PRN
  Administered 2017-07-25 (×2): 650 mg via ORAL
  Filled 2017-07-25 (×2): qty 2

## 2017-07-25 MED ORDER — ACETAMINOPHEN 650 MG RE SUPP: 650.0000 mg | Freq: Four times a day (QID) | RECTAL | Status: DC | PRN

## 2017-07-25 MED ORDER — ONDANSETRON HCL 4 MG PO TABS: 4.0000 mg | ORAL_TABLET | Freq: Four times a day (QID) | ORAL | Status: DC | PRN

## 2017-07-25 MED ORDER — BISACODYL 5 MG PO TBEC: 5.0000 mg | DELAYED_RELEASE_TABLET | Freq: Every day | ORAL | Status: DC | PRN
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
## 2017-07-25 NOTE — ED Notes (Signed)
X-ray at bedside at this time.

## 2017-07-25 NOTE — ED Triage Notes (Signed)
Insect bite yesterday. Now noted red streak running up arm from bite site.

## 2017-07-25 NOTE — ED Notes (Signed)
Pt has insect bite on the left forearm, was seen yesterday and given topical meds. Pt has red streaks that go up the arm to the elbow region.

## 2017-07-25 NOTE — Progress Notes (Signed)
Chaplain responded to an OR for AD. Pt was alert and oriented. Pt had lost family members recently and family suggested she receive information. Chaplain offered prayer and pt accepted. Chaplain prayed for bodily healing.   07/25/17 1300  Clinical Encounter Type  Visited With Patient  Visit Type Initial  Referral From Nurse  Spiritual Encounters  Spiritual Needs Brochure;Prayer

## 2017-07-26 LAB — CBC
HEMATOCRIT: 36.8 % (ref 35.0–47.0)
Hemoglobin: 12.5 g/dL (ref 12.0–16.0)
MCH: 29.4 pg (ref 26.0–34.0)
MCHC: 33.9 g/dL (ref 32.0–36.0)
MCV: 86.8 fL (ref 80.0–100.0)
PLATELETS: 287 10*3/uL (ref 150–440)
RBC: 4.24 MIL/uL (ref 3.80–5.20)
RDW: 14.5 % (ref 11.5–14.5)
WBC: 9.9 10*3/uL (ref 3.6–11.0)

## 2017-07-26 LAB — BASIC METABOLIC PANEL
Anion gap: 5 (ref 5–15)
BUN: 13 mg/dL (ref 6–20)
CALCIUM: 8.4 mg/dL — AB (ref 8.9–10.3)
CO2: 25 mmol/L (ref 22–32)
Chloride: 110 mmol/L (ref 98–111)
Creatinine, Ser: 0.68 mg/dL (ref 0.44–1.00)
GFR calc Af Amer: >60 mL/min (ref >60)
GLUCOSE: 104 mg/dL — AB (ref 70–99)
Potassium: 3.8 mmol/L (ref 3.5–5.1)
Sodium: 140 mmol/L (ref 135–145)

## 2017-07-26 MED ORDER — PREDNISONE 10 MG (21) PO TBPK: ORAL_TABLET | ORAL | 0 refills | Status: DC

## 2017-07-26 MED ORDER — CLINDAMYCIN HCL 300 MG PO CAPS: 300.0000 mg | ORAL_CAPSULE | Freq: Three times a day (TID) | ORAL | 0 refills | Status: AC

## 2017-07-26 NOTE — Progress Notes (Signed)
DISCHARGE NOTE:  Pt given discharge instructions and scripts. Pt verbalized understanding. Pt wheeled to car by staff.  
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
## 2017-07-28 LAB — HIV ANTIBODY (ROUTINE TESTING W REFLEX): HIV Screen 4th Generation wRfx: NONREACTIVE

## 2017-08-31 ENCOUNTER — Emergency Department
Admission: EM | Admit: 2017-08-31 | Discharge: 2017-08-31 | Disposition: A | Discharge status: Home / Self Care | Payer: BLUE CROSS/BLUE SHIELD | Attending: Emergency Medicine | Admitting: Emergency Medicine

## 2017-08-31 ENCOUNTER — Other Ambulatory Visit: Payer: Self-pay

## 2017-08-31 ENCOUNTER — Encounter: Payer: Self-pay | Admitting: Emergency Medicine

## 2017-08-31 DIAGNOSIS — Z79899 Other long term (current) drug therapy: Secondary | ICD-10-CM | POA: Diagnosis not present

## 2017-08-31 DIAGNOSIS — R2232 Localized swelling, mass and lump, left upper limb: Secondary | ICD-10-CM | POA: Diagnosis present

## 2017-08-31 DIAGNOSIS — L03115 Cellulitis of right lower limb: Secondary | ICD-10-CM | POA: Diagnosis not present

## 2017-08-31 DIAGNOSIS — I10 Essential (primary) hypertension: Secondary | ICD-10-CM | POA: Diagnosis not present

## 2017-08-31 LAB — COMPREHENSIVE METABOLIC PANEL
ALBUMIN: 3.7 g/dL (ref 3.5–5.0)
ALT: 14 U/L (ref 0–44)
ANION GAP: 8 (ref 5–15)
AST: 16 U/L (ref 15–41)
Alkaline Phosphatase: 55 U/L (ref 38–126)
BUN: 15 mg/dL (ref 6–20)
CALCIUM: 9 mg/dL (ref 8.9–10.3)
CO2: 27 mmol/L (ref 22–32)
CREATININE: 0.76 mg/dL (ref 0.44–1.00)
Chloride: 106 mmol/L (ref 98–111)
GFR calc Af Amer: >60 mL/min (ref >60)
GFR calc non Af Amer: >60 mL/min (ref >60)
GLUCOSE: 100 mg/dL — AB (ref 70–99)
Potassium: 3.7 mmol/L (ref 3.5–5.1)
SODIUM: 141 mmol/L (ref 135–145)
Total Bilirubin: 0.6 mg/dL (ref 0.3–1.2)
Total Protein: 7.9 g/dL (ref 6.5–8.1)

## 2017-08-31 LAB — CBC WITH DIFFERENTIAL/PLATELET
BASOS PCT: 1 %
Basophils Absolute: 0.1 10*3/uL (ref 0–0.1)
Eosinophils Absolute: 0.3 10*3/uL (ref 0–0.7)
Eosinophils Relative: 3 %
HCT: 37.6 % (ref 35.0–47.0)
Hemoglobin: 13 g/dL (ref 12.0–16.0)
LYMPHS ABS: 2.5 10*3/uL (ref 1.0–3.6)
Lymphocytes Relative: 23 %
MCH: 29.7 pg (ref 26.0–34.0)
MCHC: 34.6 g/dL (ref 32.0–36.0)
MCV: 85.8 fL (ref 80.0–100.0)
MONO ABS: 0.8 10*3/uL (ref 0.2–0.9)
MONOS PCT: 7 %
NEUTROS ABS: 6.9 10*3/uL — AB (ref 1.4–6.5)
Neutrophils Relative %: 66 %
Platelets: 299 10*3/uL (ref 150–440)
RBC: 4.38 MIL/uL (ref 3.80–5.20)
RDW: 14 % (ref 11.5–14.5)
WBC: 10.5 10*3/uL (ref 3.6–11.0)

## 2017-08-31 LAB — LACTIC ACID, PLASMA: Lactic Acid, Venous: 0.7 mmol/L (ref 0.5–1.9)

## 2017-08-31 MED ORDER — FAMOTIDINE IN NACL 20-0.9 MG/50ML-% IV SOLN
20.0000 mg | Freq: Once | INTRAVENOUS | Status: AC
  Administered 2017-08-31: 20 mg via INTRAVENOUS
  Filled 2017-08-31: qty 50

## 2017-08-31 MED ORDER — CLINDAMYCIN PHOSPHATE 600 MG/50ML IV SOLN
600.0000 mg | Freq: Once | INTRAVENOUS | Status: AC
  Administered 2017-08-31: 600 mg via INTRAVENOUS
  Filled 2017-08-31: qty 50

## 2017-08-31 MED ORDER — HYDROXYZINE HCL 50 MG PO TABS: 50.0000 mg | ORAL_TABLET | Freq: Three times a day (TID) | ORAL | 0 refills | Status: DC | PRN

## 2017-08-31 MED ORDER — CLINDAMYCIN HCL 300 MG PO CAPS: 300.0000 mg | ORAL_CAPSULE | Freq: Three times a day (TID) | ORAL | 0 refills | Status: AC

## 2017-08-31 MED ORDER — METHYLPREDNISOLONE 4 MG PO TBPK: ORAL_TABLET | ORAL | 0 refills | Status: DC

## 2017-08-31 MED ORDER — DIPHENHYDRAMINE HCL 50 MG/ML IJ SOLN
25.0000 mg | Freq: Once | INTRAMUSCULAR | Status: AC
  Administered 2017-08-31: 25 mg via INTRAVENOUS
  Filled 2017-08-31: qty 1

## 2017-08-31 MED ORDER — SODIUM CHLORIDE 0.9 % IV BOLUS
500.0000 mL | Freq: Once | INTRAVENOUS | Status: AC
  Administered 2017-08-31: 500 mL via INTRAVENOUS

## 2017-08-31 MED ORDER — METHYLPREDNISOLONE SODIUM SUCC 125 MG IJ SOLR
80.0000 mg | Freq: Once | INTRAMUSCULAR | Status: AC
  Administered 2017-08-31: 80 mg via INTRAVENOUS
  Filled 2017-08-31: qty 2
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
## 2017-08-31 NOTE — ED Triage Notes (Signed)
Pt presents with "insect bites" to left hand and right leg. Pt has hx of cellulitis requiring hospitalization. She states that these spots started 2 days ago and have already gotten bigger, hotter, more painful and itchy. Pt has been treating with cortisone creme. Pt alert & oriented with nad noted.

## 2017-08-31 NOTE — ED Notes (Signed)
Pt reports got bit 2 days ago by something and the area has grown in size. Pt reports areas are painful and itch. Pt with large red warm area noted to the back of her right thigh. Pt also with redness and swelling noted to lateral aspect of left hand. Pt reports unsure of what she got bit by but states this has happened before and she ended up in the hospital. Pt denies fevers, NVD or other sx's.

## 2017-11-30 ENCOUNTER — Encounter: Payer: Self-pay | Admitting: Emergency Medicine

## 2017-11-30 ENCOUNTER — Emergency Department
Admission: EM | Admit: 2017-11-30 | Discharge: 2017-11-30 | Disposition: A | Discharge status: Home / Self Care | Payer: Medicaid Other | Attending: Emergency Medicine | Admitting: Emergency Medicine

## 2017-11-30 ENCOUNTER — Other Ambulatory Visit: Payer: Self-pay

## 2017-11-30 DIAGNOSIS — J9801 Acute bronchospasm: Secondary | ICD-10-CM | POA: Diagnosis not present

## 2017-11-30 DIAGNOSIS — R05 Cough: Secondary | ICD-10-CM | POA: Diagnosis present

## 2017-11-30 DIAGNOSIS — I10 Essential (primary) hypertension: Secondary | ICD-10-CM | POA: Diagnosis not present

## 2017-11-30 DIAGNOSIS — Z79899 Other long term (current) drug therapy: Secondary | ICD-10-CM | POA: Insufficient documentation

## 2017-11-30 DIAGNOSIS — J45909 Unspecified asthma, uncomplicated: Secondary | ICD-10-CM | POA: Insufficient documentation

## 2017-11-30 MED ORDER — METHYLPREDNISOLONE 4 MG PO TBPK: ORAL_TABLET | ORAL | 0 refills | Status: DC

## 2017-11-30 MED ORDER — HYDROCOD POLST-CPM POLST ER 10-8 MG/5ML PO SUER: 5.0000 mL | Freq: Two times a day (BID) | ORAL | 0 refills | Status: DC

## 2017-11-30 MED ORDER — HYDROCOD POLST-CPM POLST ER 10-8 MG/5ML PO SUER
5.0000 mL | Freq: Once | ORAL | Status: AC
  Administered 2017-11-30: 5 mL via ORAL
  Filled 2017-11-30: qty 5

## 2017-11-30 MED ORDER — METHYLPREDNISOLONE SODIUM SUCC 125 MG IJ SOLR
125.0000 mg | Freq: Once | INTRAMUSCULAR | Status: AC
  Administered 2017-11-30: 125 mg via INTRAMUSCULAR
  Filled 2017-11-30: qty 2

## 2017-11-30 MED ORDER — PSEUDOEPHEDRINE HCL ER 120 MG PO TB12: 120.0000 mg | ORAL_TABLET | Freq: Two times a day (BID) | ORAL | 0 refills | Status: DC | PRN

## 2017-11-30 NOTE — ED Triage Notes (Signed)
Patient complaining of cough producing clear mucus.  Hx of asthma, states "my bronchitis flares up this time every year".  Sx began a few days ago, worse last PM.  States she has used her inhaler.  Alert and oriented, color good, skin warm and dry.  Dry cough noted occasionally.  Lung fields clear bilaterally to auscultation anterior and posterior.  RR 20.
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
## 2017-11-30 NOTE — ED Notes (Signed)
See triage note  Presents with cough   States she developed cough a few days ago  States cough is prod  No fever

## 2017-12-07 ENCOUNTER — Encounter: Payer: Self-pay | Admitting: Emergency Medicine

## 2017-12-07 ENCOUNTER — Emergency Department
Admission: EM | Admit: 2017-12-07 | Discharge: 2017-12-07 | Disposition: A | Discharge status: Home / Self Care | Payer: Medicaid Other | Attending: Emergency Medicine | Admitting: Emergency Medicine

## 2017-12-07 DIAGNOSIS — J209 Acute bronchitis, unspecified: Secondary | ICD-10-CM

## 2017-12-07 DIAGNOSIS — Z79899 Other long term (current) drug therapy: Secondary | ICD-10-CM | POA: Diagnosis not present

## 2017-12-07 DIAGNOSIS — I10 Essential (primary) hypertension: Secondary | ICD-10-CM | POA: Diagnosis not present

## 2017-12-07 DIAGNOSIS — J029 Acute pharyngitis, unspecified: Secondary | ICD-10-CM | POA: Diagnosis present

## 2017-12-07 MED ORDER — PREDNISONE 10 MG (21) PO TBPK: ORAL_TABLET | ORAL | 0 refills | Status: DC

## 2017-12-07 MED ORDER — AZITHROMYCIN 250 MG PO TABS: ORAL_TABLET | ORAL | 0 refills | Status: DC

## 2017-12-07 MED ORDER — ALBUTEROL SULFATE HFA 108 (90 BASE) MCG/ACT IN AERS: 2.0000 | INHALATION_SPRAY | RESPIRATORY_TRACT | 1 refills | Status: DC | PRN
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
## 2017-12-07 NOTE — Discharge Instructions (Addendum)
Follow-up with your regular doctor or the acute care if not better in 3 days.  Take medications as prescribed.  Drink plenty of fluids and rest to help your body heal.  Tylenol or ibuprofen if you develop fever.  Return if worsening.

## 2017-12-07 NOTE — ED Triage Notes (Signed)
Patient presents to ED via POV from home with c/o sore throat. Voice noted to be hoarse. Patient reports she was here last Monday and given hydrocodone, methylprednisolone and pseudoephedrine. Patient returns today because she is still hurting and having trouble sleeping due to coughing all night. Even and non labored respirations noted. Tonsils are red but not inflamed. Patient able to speak full sentences.

## 2017-12-07 NOTE — ED Notes (Signed)
See triage note  States she was seen last week and dx'd with bronchospasm    States she thinks her sx's are worse   Voice is hoarse  Having increased pain with swallowing and pain to left ear  afebrile on arrival

## 2018-01-06 ENCOUNTER — Emergency Department (HOSPITAL_COMMUNITY): Payer: Medicaid Other

## 2018-01-06 ENCOUNTER — Emergency Department (HOSPITAL_COMMUNITY)
Admission: EM | Admit: 2018-01-06 | Discharge: 2018-01-07 | Disposition: A | Discharge status: Home / Self Care | Payer: Medicaid Other | Attending: Emergency Medicine | Admitting: Emergency Medicine

## 2018-01-06 ENCOUNTER — Encounter (HOSPITAL_COMMUNITY): Payer: Self-pay

## 2018-01-06 DIAGNOSIS — Z79899 Other long term (current) drug therapy: Secondary | ICD-10-CM | POA: Insufficient documentation

## 2018-01-06 DIAGNOSIS — R509 Fever, unspecified: Secondary | ICD-10-CM | POA: Diagnosis present

## 2018-01-06 DIAGNOSIS — I10 Essential (primary) hypertension: Secondary | ICD-10-CM | POA: Diagnosis not present

## 2018-01-06 DIAGNOSIS — R69 Illness, unspecified: Secondary | ICD-10-CM

## 2018-01-06 DIAGNOSIS — J111 Influenza due to unidentified influenza virus with other respiratory manifestations: Secondary | ICD-10-CM | POA: Diagnosis not present

## 2018-01-06 DIAGNOSIS — J45909 Unspecified asthma, uncomplicated: Secondary | ICD-10-CM | POA: Insufficient documentation

## 2018-01-06 LAB — CBC WITH DIFFERENTIAL/PLATELET
Abs Immature Granulocytes: 0.08 10*3/uL — ABNORMAL HIGH (ref 0.00–0.07)
BASOS PCT: 0 %
Basophils Absolute: 0 10*3/uL (ref 0.0–0.1)
Eosinophils Absolute: 0.1 10*3/uL (ref 0.0–0.5)
Eosinophils Relative: 1 %
HCT: 41.3 % (ref 36.0–46.0)
Hemoglobin: 13.4 g/dL (ref 12.0–15.0)
Immature Granulocytes: 1 %
Lymphocytes Relative: 6 %
Lymphs Abs: 0.9 10*3/uL (ref 0.7–4.0)
MCH: 28.9 pg (ref 26.0–34.0)
MCHC: 32.4 g/dL (ref 30.0–36.0)
MCV: 89 fL (ref 80.0–100.0)
MONOS PCT: 7 %
Monocytes Absolute: 1 10*3/uL (ref 0.1–1.0)
Neutro Abs: 12.6 10*3/uL — ABNORMAL HIGH (ref 1.7–7.7)
Neutrophils Relative %: 85 %
Platelets: 346 10*3/uL (ref 150–400)
RBC: 4.64 MIL/uL (ref 3.87–5.11)
RDW: 13.9 % (ref 11.5–15.5)
WBC: 14.7 10*3/uL — ABNORMAL HIGH (ref 4.0–10.5)
nRBC: 0 % (ref 0.0–0.2)

## 2018-01-06 LAB — COMPREHENSIVE METABOLIC PANEL
ALT: 14 U/L (ref 0–44)
AST: 17 U/L (ref 15–41)
Albumin: 3.8 g/dL (ref 3.5–5.0)
Alkaline Phosphatase: 46 U/L (ref 38–126)
Anion gap: 14 (ref 5–15)
BUN: 8 mg/dL (ref 6–20)
CO2: 21 mmol/L — ABNORMAL LOW (ref 22–32)
CREATININE: 0.85 mg/dL (ref 0.44–1.00)
Calcium: 9.4 mg/dL (ref 8.9–10.3)
Chloride: 102 mmol/L (ref 98–111)
GFR calc Af Amer: >60 mL/min (ref >60)
GFR calc non Af Amer: >60 mL/min (ref >60)
Glucose, Bld: 107 mg/dL — ABNORMAL HIGH (ref 70–99)
Potassium: 3.7 mmol/L (ref 3.5–5.1)
Sodium: 137 mmol/L (ref 135–145)
Total Bilirubin: 0.5 mg/dL (ref 0.3–1.2)
Total Protein: 7.6 g/dL (ref 6.5–8.1)

## 2018-01-06 LAB — INFLUENZA PANEL BY PCR (TYPE A & B)
Influenza A By PCR: NEGATIVE
Influenza B By PCR: NEGATIVE

## 2018-01-06 LAB — I-STAT TROPONIN, ED: Troponin i, poc: 0 ng/mL (ref 0.00–0.08)

## 2018-01-06 MED ORDER — ACETAMINOPHEN 500 MG PO TABS
1000.0000 mg | ORAL_TABLET | Freq: Once | ORAL | Status: AC
  Administered 2018-01-06: 1000 mg via ORAL
  Filled 2018-01-06: qty 2

## 2018-01-06 MED ORDER — IPRATROPIUM-ALBUTEROL 0.5-2.5 (3) MG/3ML IN SOLN
3.0000 mL | Freq: Once | RESPIRATORY_TRACT | Status: AC
  Administered 2018-01-06: 3 mL via RESPIRATORY_TRACT
  Filled 2018-01-06: qty 3

## 2018-01-06 MED ORDER — ACETAMINOPHEN 325 MG PO TABS: 650.0000 mg | ORAL_TABLET | Freq: Once | ORAL | Status: DC | PRN

## 2018-01-06 MED ORDER — SODIUM CHLORIDE 0.9 % IV BOLUS
2000.0000 mL | Freq: Once | INTRAVENOUS | Status: AC
  Administered 2018-01-06: 2000 mL via INTRAVENOUS

## 2018-01-06 MED ORDER — ALBUTEROL SULFATE (2.5 MG/3ML) 0.083% IN NEBU
5.0000 mg | INHALATION_SOLUTION | Freq: Once | RESPIRATORY_TRACT | Status: DC
  Filled 2018-01-06: qty 6

## 2018-01-06 NOTE — ED Triage Notes (Signed)
Pt reports fever and flu like symptoms that started today. Pt has hx of asthma using inhaler at home without relief. Pt tearful in triage

## 2018-01-06 NOTE — ED Notes (Signed)
Patient transported to X-ray 

## 2018-01-06 NOTE — ED Notes (Signed)
ED Provider at bedside. 
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
## 2018-01-07 MED ORDER — ALBUTEROL SULFATE HFA 108 (90 BASE) MCG/ACT IN AERS: 2.0000 | INHALATION_SPRAY | RESPIRATORY_TRACT | 1 refills | Status: DC | PRN

## 2018-01-07 MED ORDER — IBUPROFEN 400 MG PO TABS
600.0000 mg | ORAL_TABLET | Freq: Once | ORAL | Status: AC
  Administered 2018-01-07: 600 mg via ORAL
  Filled 2018-01-07: qty 1

## 2018-01-07 NOTE — ED Notes (Signed)
Patient verbalizes understanding of discharge instructions. Opportunity for questioning and answers were provided. Armband removed by staff, pt discharged from ED home via POV.  

## 2018-01-07 NOTE — Discharge Instructions (Addendum)
Please take Ibuprofen (Advil, motrin) and Tylenol (acetaminophen) to relieve your pain.  You may take up to 600 MG (3 pills) of normal strength ibuprofen every 8 hours as needed.  In between doses of ibuprofen you make take tylenol, up to 1,000 mg (two extra strength pills).  Do not take more than 3,000 mg tylenol in a 24 hour period.  Please check all medication labels as many medications such as pain and cold medications may contain tylenol.  Do not drink alcohol while taking these medications.  Do not take other NSAID'S while taking ibuprofen (such as aleve or naproxen).  Please take ibuprofen with food to decrease stomach upset.  As we discussed today your heart rate was elevated.  We discussed how given you have a fever this is most likely from a virus causing your fever and your high heart rate, however there are other, possibly serious causes, that can cause similar symptoms.  We discussed possible additional evaluation options for these.  If you have any concerns develop new symptoms you need to seek additional medical care and evaluation.  Please schedule a follow-up appointment with your primary care doctor in the next 2 days.  I have given you information to read about influenza.  Your flu test today was negative however you most likely have an influenza-like illness.

## 2018-03-07 ENCOUNTER — Other Ambulatory Visit: Payer: Self-pay

## 2018-03-07 ENCOUNTER — Emergency Department
Admission: EM | Admit: 2018-03-07 | Discharge: 2018-03-07 | Disposition: A | Discharge status: Home / Self Care | Payer: Medicaid Other | Attending: Emergency Medicine | Admitting: Emergency Medicine

## 2018-03-07 DIAGNOSIS — J45909 Unspecified asthma, uncomplicated: Secondary | ICD-10-CM | POA: Diagnosis not present

## 2018-03-07 DIAGNOSIS — Z79899 Other long term (current) drug therapy: Secondary | ICD-10-CM | POA: Diagnosis not present

## 2018-03-07 DIAGNOSIS — G43019 Migraine without aura, intractable, without status migrainosus: Secondary | ICD-10-CM | POA: Diagnosis not present

## 2018-03-07 DIAGNOSIS — G43909 Migraine, unspecified, not intractable, without status migrainosus: Secondary | ICD-10-CM | POA: Diagnosis present

## 2018-03-07 DIAGNOSIS — I10 Essential (primary) hypertension: Secondary | ICD-10-CM | POA: Diagnosis not present

## 2018-03-07 MED ORDER — SODIUM CHLORIDE 0.9 % IV BOLUS
1000.0000 mL | Freq: Once | INTRAVENOUS | Status: AC
  Administered 2018-03-07: 1000 mL via INTRAVENOUS

## 2018-03-07 MED ORDER — ACETAMINOPHEN 325 MG PO TABS
650.0000 mg | ORAL_TABLET | Freq: Once | ORAL | Status: AC
  Administered 2018-03-07: 650 mg via ORAL
  Filled 2018-03-07: qty 2

## 2018-03-07 MED ORDER — SUMATRIPTAN SUCCINATE 6 MG/0.5ML ~~LOC~~ SOLN
6.0000 mg | Freq: Once | SUBCUTANEOUS | Status: AC
  Administered 2018-03-07: 6 mg via SUBCUTANEOUS
  Filled 2018-03-07: qty 0.5

## 2018-03-07 MED ORDER — METOCLOPRAMIDE HCL 5 MG/ML IJ SOLN
10.0000 mg | Freq: Once | INTRAMUSCULAR | Status: AC
  Administered 2018-03-07: 10 mg via INTRAVENOUS
  Filled 2018-03-07: qty 2

## 2018-03-07 MED ORDER — DIPHENHYDRAMINE HCL 50 MG/ML IJ SOLN
25.0000 mg | Freq: Once | INTRAMUSCULAR | Status: AC
  Administered 2018-03-07: 25 mg via INTRAVENOUS
  Filled 2018-03-07: qty 1

## 2018-03-07 MED ORDER — IBUPROFEN 600 MG PO TABS: 600.0000 mg | ORAL_TABLET | Freq: Four times a day (QID) | ORAL | 0 refills | Status: DC | PRN

## 2018-03-07 MED ORDER — KETOROLAC TROMETHAMINE 30 MG/ML IJ SOLN
30.0000 mg | Freq: Once | INTRAMUSCULAR | Status: AC
  Administered 2018-03-07: 30 mg via INTRAMUSCULAR
  Filled 2018-03-07: qty 1

## 2018-03-07 NOTE — ED Triage Notes (Signed)
Pt comes via POV from home with c/o migraine for 2 days. Pt states she had medication for her migraines but ran out.  Pt states she hasn't had one in awhile. Pt states 8/10 pain at this time.

## 2018-03-07 NOTE — ED Notes (Signed)
First Nurse Note: pt to ED c/o migraine, hx/o same. Pt states that she has had migraine x 2 days but it has gotten worse. Pt states that she was on medication but she ran out and has not been back to her MD. Pt is in NAD, texting on her phone

## 2018-03-07 NOTE — ED Notes (Signed)
See triage note  Presents with headache  Has a hx of migraines and this h/a feels the same  States pain is across frontal area  Positive nausea and photophobia   States she ran out of her regular meds about 1 month ago
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
## 2018-05-04 ENCOUNTER — Encounter: Payer: Self-pay | Admitting: Emergency Medicine

## 2018-05-04 ENCOUNTER — Emergency Department
Admission: EM | Admit: 2018-05-04 | Discharge: 2018-05-04 | Disposition: A | Discharge status: Home / Self Care | Payer: Medicaid Other | Attending: Student in an Organized Health Care Education/Training Program | Admitting: Student in an Organized Health Care Education/Training Program

## 2018-05-04 ENCOUNTER — Other Ambulatory Visit: Payer: Self-pay

## 2018-05-04 DIAGNOSIS — J029 Acute pharyngitis, unspecified: Secondary | ICD-10-CM | POA: Diagnosis present

## 2018-05-04 DIAGNOSIS — Z20828 Contact with and (suspected) exposure to other viral communicable diseases: Secondary | ICD-10-CM | POA: Diagnosis not present

## 2018-05-04 DIAGNOSIS — J069 Acute upper respiratory infection, unspecified: Secondary | ICD-10-CM

## 2018-05-04 LAB — GROUP A STREP BY PCR: Group A Strep by PCR: NOT DETECTED

## 2018-05-04 LAB — SARS CORONAVIRUS 2 BY RT PCR (HOSPITAL ORDER, PERFORMED IN ~~LOC~~ HOSPITAL LAB): SARS Coronavirus 2: NEGATIVE

## 2018-05-04 MED ORDER — AZITHROMYCIN 250 MG PO TABS: ORAL_TABLET | ORAL | 0 refills | Status: DC

## 2018-05-04 MED ORDER — DEXAMETHASONE 4 MG PO TABS
10.0000 mg | ORAL_TABLET | Freq: Once | ORAL | Status: AC
  Administered 2018-05-04: 10 mg via ORAL
  Filled 2018-05-04: qty 2.5

## 2018-05-04 MED ORDER — ACETAMINOPHEN 500 MG PO TABS
1000.0000 mg | ORAL_TABLET | Freq: Once | ORAL | Status: AC
  Administered 2018-05-04: 1000 mg via ORAL
  Filled 2018-05-04: qty 2

## 2018-05-04 NOTE — ED Triage Notes (Signed)
Pt states was seen by [facility] yesterday for bruising and chest pain, states woke up today and woke up with sore throat and fever at home of 100.5, states went to work with fatigue and at work her fever was 101.1. Pt denies taking antipyretics PTA. Pt c/o sore throat, fever, chills, and fatigue at this time.

## 2018-05-04 NOTE — Discharge Instructions (Signed)
You have a viral illness which can have symptoms like muscle aches, fevers, chills, runny nose, cough, sneezing, sore throat, vomiting or diarrhea. One of the viruses that can cause this is SARS- CoV-2, the virus that causes COVID-19, also known as the novel coronavirus. You could also have a different viral infection such as the common cold or flu. Most patients with viral illness including COVID-19 have mild symptoms and recover on their own. Resting, staying hydrated, and sleeping are helpful. Today we think you are well enough to go home and treat your symptoms with oral liquids, and medicine for fevers, cough, and pain. ° °We generally do not do COVID-19 testing on people with mild symptoms who are being discharged from the Emergency Department or Clinic.  ° °If we did a test for COVID-19 the results will not be available for several days. We will call you with the result. Please DO NOT CONTACT THE EMERGENCY DEPARTMENT OR CLINIC FOR RESULTS OF THIS TEST.  ° °Please follow the precautions below:  °Stay home for at least 7 days after your symptoms began OR for 3 days after your fever ends, whichever takes longer. ? ° °If people live with you they should also stay home and avoid contact with others for 14 days.? ° ° °ISOLATION GUIDANCE FOR POSSIBLE COVID °Most people with cough and fever have an illness caused by a virus. One is COVID-19. Not all people with these infections are being tested for the virus that causes COVID-19. People who might have COVID but are not being tested should still try to prevent the spread of the infection. ° °These instructions are modified recommendations from the  Department of Health and Human Services. ° °People who might have COVID-19 should follow the instructions below until a doctor or health department says they can stop. ° °Stay home unless you need to see a doctor °Stop doing things outside your home except for getting medical care. Do not go to work, school,  or public areas; and do not use public transportation or taxis. ° °Call ahead before visiting the doctor °Before your appointment, call the doctor's office and tell them about your symptoms. This will help them take steps to keep other people from getting infected.  ° °Keep track of your symptoms °Symptoms are the things you feel, like fever or trouble breathing. Go to your doctor or the ER if you think you are getting worse, like having more trouble breathing. Call the doctor's office and tell them about your symptoms. This will help them take steps to keep other people from getting sick.  ° °Wear a face mask °You should wear a face mask that covers your nose and mouth when you are in the same room with other people and when you visit the doctor's office. People who live with you or visit you should also wear a face mask when they are in the same room with you. ° °Separate yourself from other people in your home °You should stay in a different room from other people in your home. You should stay separate from your family members as much as possible. You should use a separate bathroom if you can. ° °Avoid sharing things in your house °Don't share dishes, drinking glasses, cups, eating utensils, towels, bedding, or other things with people in your home. After using these things please wash them really well with soap and water. ° °Cover your coughs and sneezes °Cover your mouth and nose with a tissue when you   cough or sneeze, or you can cough or sneeze into your sleeve. Throw used tissues in a trash can that has a bag in it, and immediately wash your hands with soap and water for at least 20 seconds. If you use an alcohol-based hand rub please rub your hands together for 20 seconds. ° °Wash your hands °Wash your hands often and very well with soap and water for at least 20 seconds. If your hands are not visibly dirty you can use an alcohol-based hand sanitizer. Don't touch your eyes, nose, or mouth with unwashed  hands. ° ° ° °Instructions for People Helping Care for Patients with Possible COVID-19 °Follow your doctor's instructions °Make sure that you understand and can help the patient follow any instructions for care. ° °Provide for the patient's basic needs °You should help the patient with basic needs in the home and provide support for getting groceries, prescriptions, and other personal needs. ° °Keep track of the patient's symptoms °If they are getting sicker, call his or her doctor. This will help the doctor's office take steps to keep other people from getting infected. ° °Limit the number of people who have contact with the patient °If possible, have only one caregiver for the patient. °Other family members should stay in another home or place of residence. If they can't, they should stay in another room and stay separated from the patient as much as possible. °Keep one bathroom JUST for the patient if you can.  °Only allow visitors if they MUST be in the home. ° °Keep older adults, very young children, and other sick people away from the patient °Keep older adults, very young children, and people who have compromised immune systems or chronic health conditions away from the patient. This includes people with chronic heart, lung, or kidney conditions, diabetes, and cancer. ° °Wash your hands often °Avoid touching your eyes, nose, and mouth with unwashed hands. °Wash your hands often and thoroughly with soap and water for at least 20 seconds. You can use an alcohol-based hand sanitizer if soap and water are not available and if your hands are not visibly dirty.  °Use disposable paper towels to dry your hands. If not available, use dedicated cloth towels and replace them when they become wet. ° °Avoid contamination from face masks and gloves °Wear a disposable face mask and gloves whenever you are touching the patient, things in their room or bathroom, or things that can have their body fluid on them, like bedding  or dishes, or blood, vomit, urine, or feces (poop).  °Ensure the mask fits over your nose and mouth tightly, and do not touch it at all while you are wearing it. °Throw out disposable facemasks and gloves after using them. Do not reuse. °Wash your hands immediately after removing your facemask and gloves. °If your personal clothing becomes dirty with a patient's body fluids, carefully remove clothing and launder. Wash your hands after handling dirty clothing. °Place all used disposable facemasks, gloves, and other waste in a lined container before disposing them with other household garbage.  °Remove gloves and wash your hands immediately after handling these items. ° °Do not share dishes, glasses, or other household items with the patient °Avoid sharing household items. You should not share dishes, drinking glasses, cups, eating utensils, towels, bedding, or other items with a patient who is confirmed to have, or being evaluated for, COVID-19 infection.  °After the person uses these items, you should wash them very well with soap and   water. ° °Wash laundry thoroughly °Immediately remove and wash clothes or bedding that have blood, body fluids, and/or secretions or excretions, such as sweat, saliva, sputum, nasal mucus, vomit, urine, or feces, on them.  °Wear gloves when handling laundry from the patient.  °Read and follow directions on labels of laundry or clothing items and detergent. In general, wash and dry with the warmest temperatures recommended on the label. ° °Clean all areas the individual has used  °Clean all touchable surfaces, such as counters, tabletops, doorknobs, bathroom fixtures, toilets, phones, keyboards, tablets, and bedside tables, every day. Also, clean any surfaces that may have blood, body fluids, and/or secretions or excretions on them. Wear gloves when cleaning surfaces the patient has come in contact with.  °Use a diluted bleach solution (dilute bleach with 1 part bleach and 10 parts  water) or a household disinfectant with a label that says EPA-registered for coronaviruses. To make a bleach solution at home, add 1 tablespoon of bleach to 1 quart (4 cups) of water. For a larger supply, add ¼ cup of bleach to 1 gallon (16 cups) of water.  °Read labels of cleaning products and follow recommendations provided on product labels. Labels contain instructions for safe and effective use of the cleaning product including precautions you should take when applying the product, such as wearing gloves or eye protection and making sure you have good ventilation during use of the product.  °Remove gloves and wash hands immediately after cleaning. ° °Monitor yourself for signs and symptoms of illness °Caregivers and household members are considered close contacts, should monitor their health, and will be asked to limit movement outside of the home as much as possible.  ° °If you have additional questions °Contact  °Your Doctor or Healthcare Provider °The [facility] website (http://www.[facility].edu/coronavirus) °The [facility] COVID hotline 336-70COVID °Your local health department  ° °This guidance is subject to change. For the most up to date guidance, check the state Department of Health website at NCDHHS.GOV ° °
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
## 2018-09-03 ENCOUNTER — Other Ambulatory Visit: Payer: Self-pay

## 2018-09-03 ENCOUNTER — Emergency Department: Payer: Medicaid Other

## 2018-09-03 ENCOUNTER — Emergency Department
Admission: EM | Admit: 2018-09-03 | Discharge: 2018-09-03 | Disposition: A | Discharge status: Home / Self Care | Payer: Medicaid Other | Attending: Emergency Medicine | Admitting: Emergency Medicine

## 2018-09-03 DIAGNOSIS — M791 Myalgia, unspecified site: Secondary | ICD-10-CM | POA: Diagnosis not present

## 2018-09-03 DIAGNOSIS — I10 Essential (primary) hypertension: Secondary | ICD-10-CM | POA: Insufficient documentation

## 2018-09-03 DIAGNOSIS — R197 Diarrhea, unspecified: Secondary | ICD-10-CM | POA: Insufficient documentation

## 2018-09-03 DIAGNOSIS — Z79899 Other long term (current) drug therapy: Secondary | ICD-10-CM | POA: Diagnosis not present

## 2018-09-03 DIAGNOSIS — R05 Cough: Secondary | ICD-10-CM | POA: Insufficient documentation

## 2018-09-03 DIAGNOSIS — R0981 Nasal congestion: Secondary | ICD-10-CM | POA: Insufficient documentation

## 2018-09-03 DIAGNOSIS — R51 Headache: Secondary | ICD-10-CM | POA: Insufficient documentation

## 2018-09-03 DIAGNOSIS — J45909 Unspecified asthma, uncomplicated: Secondary | ICD-10-CM | POA: Insufficient documentation

## 2018-09-03 DIAGNOSIS — Z20828 Contact with and (suspected) exposure to other viral communicable diseases: Secondary | ICD-10-CM | POA: Diagnosis not present

## 2018-09-03 DIAGNOSIS — R059 Cough, unspecified: Secondary | ICD-10-CM

## 2018-09-03 DIAGNOSIS — R509 Fever, unspecified: Secondary | ICD-10-CM | POA: Diagnosis not present

## 2018-09-03 MED ORDER — ALBUTEROL SULFATE (2.5 MG/3ML) 0.083% IN NEBU: 2.5000 mg | INHALATION_SOLUTION | Freq: Four times a day (QID) | RESPIRATORY_TRACT | 0 refills | Status: DC | PRN

## 2018-09-03 MED ORDER — BENZONATATE 200 MG PO CAPS: 200.0000 mg | ORAL_CAPSULE | Freq: Three times a day (TID) | ORAL | 0 refills | Status: AC | PRN

## 2018-09-03 NOTE — ED Triage Notes (Addendum)
Pt comes via POV from home with c/o cough. Pt also states this weekend she was around someone who just tested positive for COVID.  Pt states the cough started Monday. Pt states nonproductive cough

## 2018-09-03 NOTE — ED Notes (Signed)
Reports encounters with people dx with positive covid, has had a headache for a few days, reports here to day to be tested for covid.
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
## 2018-09-04 LAB — SARS CORONAVIRUS 2 (TAT 6-24 HRS): SARS Coronavirus 2: NEGATIVE

## 2018-09-06 ENCOUNTER — Telehealth: Payer: Self-pay

## 2018-09-06 NOTE — Telephone Encounter (Signed)
Patient is calling receive negative COVID test results. Patient expressed understanding.

## 2018-10-02 ENCOUNTER — Other Ambulatory Visit: Payer: Self-pay

## 2018-10-02 ENCOUNTER — Emergency Department
Admission: EM | Admit: 2018-10-02 | Discharge: 2018-10-02 | Disposition: A | Discharge status: Home / Self Care | Payer: Medicaid Other | Attending: Emergency Medicine | Admitting: Emergency Medicine

## 2018-10-02 ENCOUNTER — Encounter: Payer: Self-pay | Admitting: Emergency Medicine

## 2018-10-02 DIAGNOSIS — J4530 Mild persistent asthma, uncomplicated: Secondary | ICD-10-CM | POA: Diagnosis not present

## 2018-10-02 DIAGNOSIS — Z20828 Contact with and (suspected) exposure to other viral communicable diseases: Secondary | ICD-10-CM | POA: Diagnosis not present

## 2018-10-02 DIAGNOSIS — J45909 Unspecified asthma, uncomplicated: Secondary | ICD-10-CM

## 2018-10-02 DIAGNOSIS — I10 Essential (primary) hypertension: Secondary | ICD-10-CM | POA: Insufficient documentation

## 2018-10-02 DIAGNOSIS — R05 Cough: Secondary | ICD-10-CM | POA: Diagnosis present

## 2018-10-02 NOTE — ED Triage Notes (Signed)
Pt to ED via POV stating that she wants to be tested for COVID. Pt states that they are flying out of town on Tuesday and has to be tested. Pt states that she does have a cough but she has asthma and has a chronic cough. Pt is in NAD at this time.

## 2018-10-02 NOTE — ED Notes (Signed)
Refer to triage note: Pt st dry cough, headaches 3 weeks ago.Pt st taking tylenol at home w/o relief. Denies fever, abd pain N/V/D. Lat time pt was Covid tested August 23- negative result.
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
## 2018-10-02 NOTE — Discharge Instructions (Signed)
Follow-up with your primary care provider if any continued problems.  Continue using your medication for your asthma as needed.  The cover test that was done today should result and you can also see it on my chart in approximately 6 to 18 hours.

## 2018-10-03 LAB — NOVEL CORONAVIRUS, NAA (HOSP ORDER, SEND-OUT TO REF LAB; TAT 18-24 HRS): SARS-CoV-2, NAA: NOT DETECTED

## 2019-03-13 ENCOUNTER — Other Ambulatory Visit: Payer: Self-pay

## 2019-03-13 ENCOUNTER — Emergency Department
Admission: EM | Admit: 2019-03-13 | Discharge: 2019-03-13 | Disposition: A | Discharge status: Home / Self Care | Payer: Medicaid Other | Attending: Emergency Medicine | Admitting: Emergency Medicine

## 2019-03-13 ENCOUNTER — Emergency Department: Payer: Medicaid Other

## 2019-03-13 ENCOUNTER — Encounter: Payer: Self-pay | Admitting: Emergency Medicine

## 2019-03-13 DIAGNOSIS — I10 Essential (primary) hypertension: Secondary | ICD-10-CM | POA: Diagnosis not present

## 2019-03-13 DIAGNOSIS — J181 Lobar pneumonia, unspecified organism: Secondary | ICD-10-CM | POA: Diagnosis not present

## 2019-03-13 DIAGNOSIS — J45909 Unspecified asthma, uncomplicated: Secondary | ICD-10-CM | POA: Diagnosis not present

## 2019-03-13 DIAGNOSIS — R0602 Shortness of breath: Secondary | ICD-10-CM | POA: Diagnosis present

## 2019-03-13 DIAGNOSIS — Z20822 Contact with and (suspected) exposure to covid-19: Secondary | ICD-10-CM

## 2019-03-13 DIAGNOSIS — J189 Pneumonia, unspecified organism: Secondary | ICD-10-CM

## 2019-03-13 LAB — COMPREHENSIVE METABOLIC PANEL
ALT: 13 U/L (ref 0–44)
AST: 16 U/L (ref 15–41)
Albumin: 4.3 g/dL (ref 3.5–5.0)
Alkaline Phosphatase: 46 U/L (ref 38–126)
Anion gap: 6 (ref 5–15)
BUN: 16 mg/dL (ref 6–20)
CO2: 28 mmol/L (ref 22–32)
Calcium: 9.1 mg/dL (ref 8.9–10.3)
Chloride: 105 mmol/L (ref 98–111)
Creatinine, Ser: 0.71 mg/dL (ref 0.44–1.00)
GFR calc Af Amer: >60 mL/min (ref >60)
GFR calc non Af Amer: >60 mL/min (ref >60)
Glucose, Bld: 96 mg/dL (ref 70–99)
Potassium: 3.9 mmol/L (ref 3.5–5.1)
Sodium: 139 mmol/L (ref 135–145)
Total Bilirubin: 0.3 mg/dL (ref 0.3–1.2)
Total Protein: 8.3 g/dL — ABNORMAL HIGH (ref 6.5–8.1)

## 2019-03-13 LAB — CBC
HCT: 40 % (ref 36.0–46.0)
Hemoglobin: 13.5 g/dL (ref 12.0–15.0)
MCH: 29.9 pg (ref 26.0–34.0)
MCHC: 33.8 g/dL (ref 30.0–36.0)
MCV: 88.7 fL (ref 80.0–100.0)
Platelets: 299 10*3/uL (ref 150–400)
RBC: 4.51 MIL/uL (ref 3.87–5.11)
RDW: 12.8 % (ref 11.5–15.5)
WBC: 9 10*3/uL (ref 4.0–10.5)
nRBC: 0 % (ref 0.0–0.2)

## 2019-03-13 LAB — TROPONIN I (HIGH SENSITIVITY): Troponin I (High Sensitivity): <2 ng/L (ref <18)

## 2019-03-13 MED ORDER — AZITHROMYCIN 250 MG PO TABS: ORAL_TABLET | ORAL | 0 refills | Status: AC

## 2019-03-13 MED ORDER — AZITHROMYCIN 500 MG PO TABS
500.0000 mg | ORAL_TABLET | Freq: Once | ORAL | Status: AC
  Administered 2019-03-13: 500 mg via ORAL
  Filled 2019-03-13: qty 1

## 2019-03-13 MED ORDER — CEFDINIR 300 MG PO CAPS
300.0000 mg | ORAL_CAPSULE | Freq: Once | ORAL | Status: AC
  Administered 2019-03-13: 300 mg via ORAL
  Filled 2019-03-13: qty 1

## 2019-03-13 MED ORDER — IPRATROPIUM-ALBUTEROL 0.5-2.5 (3) MG/3ML IN SOLN: 3.0000 mL | Freq: Once | RESPIRATORY_TRACT | Status: DC

## 2019-03-13 MED ORDER — CEFDINIR 300 MG PO CAPS: 300.0000 mg | ORAL_CAPSULE | Freq: Two times a day (BID) | ORAL | 0 refills | Status: AC
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
## 2019-03-13 NOTE — ED Triage Notes (Signed)
Pt via EMS from home. Per EMS, family states pt has been weak, breathing difficulty, and cough. Family states pt had breathing difficulty and had one puff of albuterol pt has a hx of asthma. Upon EMS arrival, pt was laying on the floor with a pillow under head and was not willing responding to EMS. On arrival, is alert and not really responding. Dr. Cyril Loosen, MD at bedside.

## 2019-03-13 NOTE — ED Notes (Signed)
Pt heard coughing, noted to be gagging with coughing, pt coughing and noted to induced vomiting with the gagging, some saliva noted on the floor

## 2019-03-15 ENCOUNTER — Emergency Department: Payer: Medicaid Other

## 2019-03-15 ENCOUNTER — Other Ambulatory Visit: Payer: Self-pay

## 2019-03-15 ENCOUNTER — Emergency Department
Admission: EM | Admit: 2019-03-15 | Discharge: 2019-03-15 | Disposition: A | Discharge status: Home / Self Care | Payer: Medicaid Other | Attending: Emergency Medicine | Admitting: Emergency Medicine

## 2019-03-15 DIAGNOSIS — R112 Nausea with vomiting, unspecified: Secondary | ICD-10-CM | POA: Insufficient documentation

## 2019-03-15 DIAGNOSIS — M7918 Myalgia, other site: Secondary | ICD-10-CM | POA: Diagnosis not present

## 2019-03-15 DIAGNOSIS — J45909 Unspecified asthma, uncomplicated: Secondary | ICD-10-CM | POA: Diagnosis not present

## 2019-03-15 DIAGNOSIS — M791 Myalgia, unspecified site: Secondary | ICD-10-CM

## 2019-03-15 DIAGNOSIS — R0602 Shortness of breath: Secondary | ICD-10-CM | POA: Diagnosis not present

## 2019-03-15 DIAGNOSIS — E860 Dehydration: Secondary | ICD-10-CM

## 2019-03-15 DIAGNOSIS — I10 Essential (primary) hypertension: Secondary | ICD-10-CM | POA: Diagnosis not present

## 2019-03-15 DIAGNOSIS — Z79899 Other long term (current) drug therapy: Secondary | ICD-10-CM | POA: Insufficient documentation

## 2019-03-15 LAB — CBC
HCT: 42.1 % (ref 36.0–46.0)
Hemoglobin: 14.1 g/dL (ref 12.0–15.0)
MCH: 29.6 pg (ref 26.0–34.0)
MCHC: 33.5 g/dL (ref 30.0–36.0)
MCV: 88.3 fL (ref 80.0–100.0)
Platelets: 295 10*3/uL (ref 150–400)
RBC: 4.77 MIL/uL (ref 3.87–5.11)
RDW: 12.7 % (ref 11.5–15.5)
WBC: 7 10*3/uL (ref 4.0–10.5)
nRBC: 0 % (ref 0.0–0.2)

## 2019-03-15 LAB — BASIC METABOLIC PANEL
Anion gap: 12 (ref 5–15)
BUN: 10 mg/dL (ref 6–20)
CO2: 23 mmol/L (ref 22–32)
Calcium: 9.5 mg/dL (ref 8.9–10.3)
Chloride: 101 mmol/L (ref 98–111)
Creatinine, Ser: 0.76 mg/dL (ref 0.44–1.00)
GFR calc Af Amer: >60 mL/min (ref >60)
GFR calc non Af Amer: >60 mL/min (ref >60)
Glucose, Bld: 84 mg/dL (ref 70–99)
Potassium: 3.5 mmol/L (ref 3.5–5.1)
Sodium: 136 mmol/L (ref 135–145)

## 2019-03-15 MED ORDER — SODIUM CHLORIDE 0.9 % IV BOLUS
1000.0000 mL | Freq: Once | INTRAVENOUS | Status: AC
  Administered 2019-03-15: 1000 mL via INTRAVENOUS

## 2019-03-15 MED ORDER — ONDANSETRON HCL 4 MG/2ML IJ SOLN
4.0000 mg | Freq: Once | INTRAMUSCULAR | Status: AC
  Administered 2019-03-15: 4 mg via INTRAVENOUS
  Filled 2019-03-15: qty 2

## 2019-03-15 NOTE — ED Notes (Signed)
Signature pad in room not working, pt given dc instructions and verbalizes understanding.
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
## 2019-03-15 NOTE — ED Triage Notes (Signed)
Pt comes via POV from home with c/o SOB, pt states she was recently dx with pneumonia and prescribed medications  Pt states those medications are making her more sick and that now the pneumonia is in her other lung.  Pt states some SOB and aches all over.

## 2019-03-15 NOTE — Discharge Instructions (Addendum)
Please seek medical attention for any high fevers, chest pain, shortness of breath, change in behavior, persistent vomiting, bloody stool or any other new or concerning symptoms.  

## 2019-03-16 ENCOUNTER — Encounter: Payer: Self-pay | Admitting: Internal Medicine

## 2019-03-16 ENCOUNTER — Ambulatory Visit: Payer: Medicaid Other | Admitting: Internal Medicine

## 2019-03-16 VITALS — BP 132/70 | HR 112 | Temp 97.3°F | Resp 16 | Ht 61.5 in | Wt 147.1 lb

## 2019-03-16 DIAGNOSIS — J189 Pneumonia, unspecified organism: Secondary | ICD-10-CM | POA: Diagnosis not present

## 2019-03-16 DIAGNOSIS — Z09 Encounter for follow-up examination after completed treatment for conditions other than malignant neoplasm: Secondary | ICD-10-CM | POA: Diagnosis not present

## 2019-03-16 DIAGNOSIS — R112 Nausea with vomiting, unspecified: Secondary | ICD-10-CM | POA: Diagnosis not present

## 2019-03-16 DIAGNOSIS — J4540 Moderate persistent asthma, uncomplicated: Secondary | ICD-10-CM | POA: Diagnosis not present

## 2019-03-16 MED ORDER — QVAR REDIHALER 80 MCG/ACT IN AERB: 1.0000 | INHALATION_SPRAY | Freq: Two times a day (BID) | RESPIRATORY_TRACT | 1 refills | Status: DC

## 2019-03-16 MED ORDER — ONDANSETRON HCL 4 MG PO TABS: 4.0000 mg | ORAL_TABLET | Freq: Three times a day (TID) | ORAL | 1 refills | Status: DC | PRN

## 2019-03-16 NOTE — Progress Notes (Signed)
Patient ID: Julia Cherry, female    DOB: 12-14-1990, 29 y.o.   MRN: [id number]  PCP: [name], MD  Chief Complaint  Patient presents with  . Pneumonia    onset 03/12/19  . Cough  . Generalized Body Aches  . Vomiting  . No taste or smell  . Hospitalization Follow-up    Subjective:   Julia Cherry is a 29 y.o. female, presents to clinic with CC of the following:  Chief Complaint  Patient presents with  . Pneumonia    onset 03/12/19  . Cough  . Generalized Body Aches  . Vomiting  . No taste or smell  . Hospitalization Follow-up    HPI:  Patient is a 29 year old female with a history of asthma who presents today for the first time to this practice.  Patient presented to the emergency department yesterday because of concern for worsening symptoms after being diagnosed with pneumonia 3 days ago. She did have concern that the medications that were prescribed to her could be playing a role in her symptoms. Blood work was without concerning abnormality. CXR without pneumonia. ECG ok. She stated that she had multiple negative covid tests since her symptoms started. She was given IV fluids in the ER and did feel better, it was reasonable to discharge. A repeat Covid test was not found from that visit.  She was seen for RLL community-acquired pneumonia at [facility] ER on 03/13/2019.  The first dose of antibiotics was given in the emergency room, and was to continue them as an outpatient, with the possibility of a Covid pneumonia is still entertained.  The results of a Covid test from that visit was not located in epic on review today.  She did have prior Covid tests in April, August, and September 2020.  All negative.  She noted her last Covid test was done at [facility] this past weekend and was a nasal swab and AG test, negative on 03/15/19.  He did not repeat the test in the emergency room  Recent comp panel, CBC and BMP  from these visits reviewed. No major concerns noted.   She presents today with continued cough -starting to get minor particles up at times but mostly nonproductive, generalized body aches, chest tightness more than chest pains, Nausea and some vomiting, vomited once the last couple days, not yet today.  She was given IV ondansetron in the emergency room which helped, although was given nothing to take as an outpatient, some loose bowel movements, virtually happens every time she has to go to the bathroom to urinate, no bad abdominal pains, feeling feverish, energy levels are down, and notes loss of taste , not smell since Saturday.  Because of the nausea and vomiting, she was told by the emergency room to not take her antibiotics until seen here today.  She had taken 3 doses of her azithromycin and 3 doses of her Omnicef antibiotics after the pneumonia was diagnosed 03/13/2019.  She also uses albuterol via nebulizer solution 2 times daily, and has an albuterol inhaler to use as needed, and has used that about once a day.  She no longer has her Qvar inhaler, as that was not continued after her last doctor's visits, and she is not sure why that was stopped, and also was on Singulair at one point but not taking presently.  She denies any marked shortness of breath, although occasionally has coughing spells where she gets some difficulty with  shortness of breath.  No marked wheezing.  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  Tob - never smoker Patient Active Problem List   Diagnosis Date Noted  . Left arm cellulitis 07/25/2017  . Postpartum care following vaginal delivery 06/24/2015  . Pregnancy 06/21/2015  . Low back pain 06/03/2015  . Back pain 05/31/2015  . Back pain affecting pregnancy, antepartum 03/30/2015  . Bilateral lower abdominal pain 02/27/2015  . Abdominal pain affecting pregnancy, antepartum 02/09/2015      Current  Outpatient Medications:  .  azithromycin (ZITHROMAX Z-PAK) 250 MG tablet, Take 2 tablets (500 mg) on  Day 1,  followed by 1 tablet (250 mg) once daily on Days 2 through 5., Disp: 6 each, Rfl: 0 .  cefdinir (OMNICEF) 300 MG capsule, Take 1 capsule (300 mg total) by mouth 2 (two) times daily for 7 days., Disp: 14 capsule, Rfl: 0 .  albuterol (PROVENTIL) (2.5 MG/3ML) 0.083% nebulizer solution, Take 3 mLs (2.5 mg total) by nebulization every 6 (six) hours as needed for up to 10 days for wheezing or shortness of breath., Disp: 90 mL, Rfl: 0   Allergies  Allergen Reactions  . Bee Venom Anaphylaxis  . Strawberry (Diagnostic) Anaphylaxis     Past Surgical History:  Procedure Laterality Date  . NO PAST SURGERIES       Family History  Problem Relation Age of Onset  . Diabetes Maternal Grandmother   . Congestive Heart Failure Other   . Asthma Mother   . Hypertension Maternal Grandfather   . Cancer Paternal Grandmother   . Cancer Paternal Grandfather      Social History   Tobacco Use  . Smoking status: Never Smoker  . Smokeless tobacco: Never Used  Substance Use Topics  . Alcohol use: No    With staff assistance, above reviewed with the patient today.  ROS: As per HPI, otherwise no specific complaints on a limited and focused system review   Results for orders placed or performed during the hospital encounter of 03/15/19 (from the past 72 hour(s))  CBC     Status: None   Collection Time: 03/15/19  4:25 PM  Result Value Ref Range   WBC 7.0 4.0 - 10.5 K/uL   RBC 4.77 3.87 - 5.11 MIL/uL   Hemoglobin 14.1 12.0 - 15.0 g/dL   HCT 53.9 76.7 - 34.1 %   MCV 88.3 80.0 - 100.0 fL   MCH 29.6 26.0 - 34.0 pg   MCHC 33.5 30.0 - 36.0 g/dL   RDW 93.7 90.2 - 40.9 %   Platelets 295 150 - 400 K/uL   nRBC 0.0 0.0 - 0.2 %    Comment: Performed at [facility], [address] [place], [place] [address]  Basic metabolic panel     Status: None   Collection Time: 03/15/19  4:25 PM    Result Value Ref Range   Sodium 136 135 - 145 mmol/L   Potassium 3.5 3.5 - 5.1 mmol/L   Chloride 101 98 - 111 mmol/L   CO2 23 22 - 32 mmol/L   Glucose, Bld 84 70 - 99 mg/dL    Comment: Glucose reference range applies only to samples taken after fasting for at least 8 hours.   BUN 10 6 - 20 mg/dL   Creatinine, Ser 9.92 0.44 - 1.00 mg/dL   Calcium 9.5 8.9 - 42.6 mg/dL   GFR calc non Af Amer >  60 >60 mL/min   GFR calc Af Amer >60 >60 mL/min   Anion gap 12 5 - 15    Comment: Performed at [facility], [place], [place] [address]     PHQ2/9: Depression screen [facility] 2/9 03/16/2019  Decreased Interest 0  Down, Depressed, Hopeless 0  PHQ - 2 Score 0  Altered sleeping 0  Tired, decreased energy 0  Change in appetite 0  Feeling bad or failure about yourself  0  Trouble concentrating 0  Moving slowly or fidgety/restless 0  Suicidal thoughts 0  PHQ-9 Score 0  Difficult doing work/chores Not difficult at all   PHQ-2/9 Result is neg  Fall Risk: Fall Risk  03/16/2019  Falls in the past year? 1  Number falls in past yr: 0  Injury with Fall? 0      Objective:   Vitals:   03/16/19 1314  BP: 132/70  Pulse: (!) 112  Resp: 16  Temp: (!) 97.3 F (36.3 C)  TempSrc: Temporal  SpO2: 99%  Weight: 147 lb 1.6 oz (66.7 kg)  Height: 5' 1.5" (1.562 m)    Body mass index is 27.34 kg/m.  Physical Exam   NAD, masked, mildly fatigued appearing. HEENT - [place]/AT, sclera anicteric, PERRL, conj - non-inj'ed,  pharynx clear Neck - supple, no adenopathy, no rigidity Car - RRR without m/g/r Pulm- RR and effort normal at rest, CTA without wheeze or rales, O2sats good, good air movement (mild cough in office one time noted) Abd - soft, NT, ND, no masses Back - no CVA tenderness Skin- no rash noted on exposed areas, patient denied otherwise Ext - no LE edema,  Neuro/psychiatric - affect was not flat, appropriate with conversation  Alert and oriented  Grossly non-focal    Speech  normal   Results for orders placed or performed during the hospital encounter of 03/15/19  CBC  Result Value Ref Range   WBC 7.0 4.0 - 10.5 K/uL   RBC 4.77 3.87 - 5.11 MIL/uL   Hemoglobin 14.1 12.0 - 15.0 g/dL   HCT 42.1 36.0 - 46.0 %   MCV 88.3 80.0 - 100.0 fL   MCH 29.6 26.0 - 34.0 pg   MCHC 33.5 30.0 - 36.0 g/dL   RDW 12.7 11.5 - 15.5 %   Platelets 295 150 - 400 K/uL   nRBC 0.0 0.0 - 0.2 %  Basic metabolic panel  Result Value Ref Range   Sodium 136 135 - 145 mmol/L   Potassium 3.5 3.5 - 5.1 mmol/L   Chloride 101 98 - 111 mmol/L   CO2 23 22 - 32 mmol/L   Glucose, Bld 84 70 - 99 mg/dL   BUN 10 6 - 20 mg/dL   Creatinine, Ser 0.76 0.44 - 1.00 mg/dL   Calcium 9.5 8.9 - 10.3 mg/dL   GFR calc non Af Amer >60 >60 mL/min   GFR calc Af Amer >60 >60 mL/min   Anion gap 12 5 - 15       Assessment & Plan:   1. Pneumonia of right lower lobe due to infectious organism Discussed with the patient that this certainly seems consistent with a viral type of process, with her noting that generalized body aches are still very problematic.  The chest x-ray done on 03/13/2019 was read as no active disease, 3 days after the one that raise concerns for possible pneumonia.  That, combined with her O2 sats and clinical exam, is reassuring.  Does have a Covid  test from Saturday that was negative, but still concerned this could be Covid, especially with her symptoms including loss of taste.  The testing for Covid is not perfect.  Given that negative test, and perhaps an improvement with some beginnings of the antibiotic course, do feel continuing the antibiotic course is also probably the best approach. We will prescribe Zofran-every 8 hours as needed, and help with taking that, she will tolerate the antibiotics that were prescribed. Would finish the Z-Pak Also continue with the cephalosporin product twice daily and finish that course as well. Continuing symptomatic measures is important with  increase fluids, rest Continue the nebulizer product when needed, once or twice a day, and has the albuterol to use as needed. Hesitant to add oral course of steroids presently, and will return to the Qvar product 80 mg dose that she was on previously 1 puff twice daily. Would treat like possible Covid, and isolation recommended until symptoms much improved/resolving. Did discuss the long Covid syndrome concern, and sometimes takes time for these to resolve.  Continue to closely monitor, and if symptoms more problematic should follow-up and may need to again use more of an emergency setting if very symptomatic as discussed.   2. Moderate persistent asthma, unspecified whether complicated As above, return to the Qvar As the albuterol product to use as above Tentatively plan for a follow-up in about 6 weeks time, when hopefully this infectious concern is all completely resolved to further discuss her asthma regimen. - beclomethasone (QVAR REDIHALER) 80 MCG/ACT inhaler; Inhale 1 puff into the lungs 2 (two) times daily.  Dispense: 10.6 g; Refill: 1  3. Non-intractable vomiting with nausea, unspecified vomiting type Prescribed Zofran product to use as needed Also staying well-hydrated is important - ondansetron (ZOFRAN) 4 MG tablet; Take 1 tablet (4 mg total) by mouth every 8 (eight) hours as needed for nausea or vomiting.  Dispense: 21 tablet; Refill: 1  4. Encounter for examination following treatment at hospital         [name], MD 03/16/19 1:23 PM

## 2019-03-17 ENCOUNTER — Encounter: Payer: Self-pay | Admitting: Internal Medicine

## 2019-03-17 ENCOUNTER — Other Ambulatory Visit: Payer: Self-pay | Admitting: Internal Medicine

## 2019-03-17 MED ORDER — FLUTICASONE PROPIONATE HFA 110 MCG/ACT IN AERO: 2.0000 | INHALATION_SPRAY | Freq: Two times a day (BID) | RESPIRATORY_TRACT | 2 refills | Status: DC

## 2019-03-17 NOTE — Progress Notes (Signed)
Patient noted the Qvar medicine was too costly. Replaced with Flovent 110 mcg per actuation-2 puffs twice daily

## 2019-05-07 ENCOUNTER — Other Ambulatory Visit: Payer: Self-pay

## 2019-05-07 ENCOUNTER — Emergency Department (HOSPITAL_COMMUNITY)
Admission: EM | Admit: 2019-05-07 | Discharge: 2019-05-07 | Disposition: A | Discharge status: Home / Self Care | Payer: No Typology Code available for payment source | Attending: Emergency Medicine | Admitting: Emergency Medicine

## 2019-05-07 ENCOUNTER — Encounter (HOSPITAL_COMMUNITY): Payer: Self-pay | Admitting: Emergency Medicine

## 2019-05-07 DIAGNOSIS — R0789 Other chest pain: Secondary | ICD-10-CM | POA: Diagnosis not present

## 2019-05-07 DIAGNOSIS — M542 Cervicalgia: Secondary | ICD-10-CM | POA: Diagnosis not present

## 2019-05-07 DIAGNOSIS — Z5321 Procedure and treatment not carried out due to patient leaving prior to being seen by health care provider: Secondary | ICD-10-CM | POA: Diagnosis not present

## 2019-05-07 DIAGNOSIS — M546 Pain in thoracic spine: Secondary | ICD-10-CM | POA: Insufficient documentation
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
## 2019-05-20 ENCOUNTER — Emergency Department: Payer: No Typology Code available for payment source

## 2019-05-20 ENCOUNTER — Emergency Department
Admission: EM | Admit: 2019-05-20 | Discharge: 2019-05-20 | Disposition: A | Discharge status: Home / Self Care | Payer: No Typology Code available for payment source | Attending: Emergency Medicine | Admitting: Emergency Medicine

## 2019-05-20 ENCOUNTER — Encounter: Payer: Self-pay | Admitting: Emergency Medicine

## 2019-05-20 ENCOUNTER — Other Ambulatory Visit: Payer: Self-pay

## 2019-05-20 DIAGNOSIS — M546 Pain in thoracic spine: Secondary | ICD-10-CM | POA: Diagnosis not present

## 2019-05-20 DIAGNOSIS — M542 Cervicalgia: Secondary | ICD-10-CM | POA: Insufficient documentation

## 2019-05-20 DIAGNOSIS — R0789 Other chest pain: Secondary | ICD-10-CM | POA: Diagnosis not present

## 2019-05-20 DIAGNOSIS — M25511 Pain in right shoulder: Secondary | ICD-10-CM | POA: Insufficient documentation

## 2019-05-20 DIAGNOSIS — Y9389 Activity, other specified: Secondary | ICD-10-CM | POA: Insufficient documentation

## 2019-05-20 DIAGNOSIS — Y92410 Unspecified street and highway as the place of occurrence of the external cause: Secondary | ICD-10-CM | POA: Insufficient documentation

## 2019-05-20 DIAGNOSIS — M545 Low back pain: Secondary | ICD-10-CM | POA: Diagnosis present

## 2019-05-20 DIAGNOSIS — M62830 Muscle spasm of back: Secondary | ICD-10-CM

## 2019-05-20 DIAGNOSIS — Y999 Unspecified external cause status: Secondary | ICD-10-CM | POA: Diagnosis not present

## 2019-05-20 MED ORDER — MELOXICAM 15 MG PO TABS: 15.0000 mg | ORAL_TABLET | Freq: Every day | ORAL | 0 refills | Status: DC

## 2019-05-20 MED ORDER — METHOCARBAMOL 500 MG PO TABS: 500.0000 mg | ORAL_TABLET | Freq: Four times a day (QID) | ORAL | 0 refills | Status: DC

## 2019-05-20 MED ORDER — KETOROLAC TROMETHAMINE 30 MG/ML IJ SOLN
30.0000 mg | Freq: Once | INTRAMUSCULAR | Status: AC
  Administered 2019-05-20: 30 mg via INTRAMUSCULAR
  Filled 2019-05-20: qty 1

## 2019-05-20 MED ORDER — ORPHENADRINE CITRATE 30 MG/ML IJ SOLN
60.0000 mg | Freq: Once | INTRAMUSCULAR | Status: AC
  Administered 2019-05-20: 60 mg via INTRAMUSCULAR
  Filled 2019-05-20: qty 2
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
## 2019-06-27 ENCOUNTER — Encounter: Payer: Self-pay | Admitting: Emergency Medicine

## 2019-06-27 ENCOUNTER — Emergency Department
Admission: EM | Admit: 2019-06-27 | Discharge: 2019-06-27 | Disposition: A | Discharge status: Home / Self Care | Payer: Medicaid Other | Attending: Emergency Medicine | Admitting: Emergency Medicine

## 2019-06-27 ENCOUNTER — Other Ambulatory Visit: Payer: Self-pay

## 2019-06-27 DIAGNOSIS — R519 Headache, unspecified: Secondary | ICD-10-CM | POA: Diagnosis not present

## 2019-06-27 DIAGNOSIS — R5383 Other fatigue: Secondary | ICD-10-CM | POA: Diagnosis not present

## 2019-06-27 DIAGNOSIS — E860 Dehydration: Secondary | ICD-10-CM

## 2019-06-27 DIAGNOSIS — Z79899 Other long term (current) drug therapy: Secondary | ICD-10-CM | POA: Insufficient documentation

## 2019-06-27 DIAGNOSIS — I10 Essential (primary) hypertension: Secondary | ICD-10-CM | POA: Diagnosis not present

## 2019-06-27 LAB — URINALYSIS, COMPLETE (UACMP) WITH MICROSCOPIC
Bacteria, UA: NONE SEEN
Bilirubin Urine: NEGATIVE
Glucose, UA: NEGATIVE mg/dL
Hgb urine dipstick: NEGATIVE
Ketones, ur: 20 mg/dL — AB
Nitrite: NEGATIVE
Protein, ur: NEGATIVE mg/dL
Specific Gravity, Urine: 1.031 — ABNORMAL HIGH (ref 1.005–1.030)
Squamous Epithelial / HPF: >50 — ABNORMAL HIGH (ref 0–5)
pH: 5 (ref 5.0–8.0)

## 2019-06-27 LAB — BASIC METABOLIC PANEL
Anion gap: 11 (ref 5–15)
BUN: 14 mg/dL (ref 6–20)
CO2: 27 mmol/L (ref 22–32)
Calcium: 9.1 mg/dL (ref 8.9–10.3)
Chloride: 101 mmol/L (ref 98–111)
Creatinine, Ser: 0.8 mg/dL (ref 0.44–1.00)
GFR calc Af Amer: >60 mL/min (ref >60)
GFR calc non Af Amer: >60 mL/min (ref >60)
Glucose, Bld: 86 mg/dL (ref 70–99)
Potassium: 3.3 mmol/L — ABNORMAL LOW (ref 3.5–5.1)
Sodium: 139 mmol/L (ref 135–145)

## 2019-06-27 MED ORDER — ONDANSETRON 4 MG PO TBDP: 4.0000 mg | ORAL_TABLET | Freq: Three times a day (TID) | ORAL | 0 refills | Status: DC | PRN

## 2019-06-27 MED ORDER — IBUPROFEN 600 MG PO TABS: 600.0000 mg | ORAL_TABLET | Freq: Four times a day (QID) | ORAL | 0 refills | Status: DC | PRN

## 2019-06-27 MED ORDER — LACTATED RINGERS IV BOLUS
2000.0000 mL | Freq: Once | INTRAVENOUS | Status: AC
  Administered 2019-06-27: 2000 mL via INTRAVENOUS

## 2019-06-27 MED ORDER — METOCLOPRAMIDE HCL 5 MG/ML IJ SOLN
10.0000 mg | Freq: Once | INTRAMUSCULAR | Status: AC
  Administered 2019-06-27: 10 mg via INTRAVENOUS
  Filled 2019-06-27: qty 2

## 2019-06-27 MED ORDER — DIPHENHYDRAMINE HCL 50 MG/ML IJ SOLN
25.0000 mg | Freq: Once | INTRAMUSCULAR | Status: AC
  Administered 2019-06-27: 25 mg via INTRAVENOUS
  Filled 2019-06-27: qty 1

## 2019-06-27 MED ORDER — PROMETHAZINE HCL 25 MG RE SUPP: 25.0000 mg | Freq: Three times a day (TID) | RECTAL | 0 refills | Status: DC | PRN
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
## 2019-06-27 NOTE — Discharge Instructions (Signed)
I'd recommend drinking at least 8 glasses of water for the next 2-3 days  Take the nausea and headache meds as prescribed  Take the next 2 days off work to rest and recover

## 2019-06-27 NOTE — ED Triage Notes (Signed)
C/O headache x 3 days.  AAOx3.  Skin warm and dry. NAD  MAE equally and strong.  NAD

## 2019-07-25 ENCOUNTER — Encounter: Payer: Self-pay | Admitting: Family Medicine

## 2019-07-25 ENCOUNTER — Ambulatory Visit (INDEPENDENT_AMBULATORY_CARE_PROVIDER_SITE_OTHER): Payer: Medicaid Other | Admitting: Family Medicine

## 2019-07-25 ENCOUNTER — Other Ambulatory Visit: Payer: Self-pay

## 2019-07-25 ENCOUNTER — Telehealth: Payer: Self-pay

## 2019-07-25 VITALS — Ht 61.0 in | Wt 140.0 lb

## 2019-07-25 DIAGNOSIS — J4541 Moderate persistent asthma with (acute) exacerbation: Secondary | ICD-10-CM

## 2019-07-25 DIAGNOSIS — J069 Acute upper respiratory infection, unspecified: Secondary | ICD-10-CM | POA: Diagnosis not present

## 2019-07-25 MED ORDER — PREDNISONE 20 MG PO TABS: 40.0000 mg | ORAL_TABLET | Freq: Every day | ORAL | 0 refills | Status: AC

## 2019-07-25 NOTE — Progress Notes (Signed)
Name: Julia Cherry   MRN: [id number]    DOB: 25-Mar-1990   Date:07/25/2019       Progress Note  Subjective:    Chief Complaint  Chief Complaint  Patient presents with  . Asthma    had an asthma attack this morning and a slight fever    I connected with  [name]  on 07/25/19 at  1:20 PM EDT by a video enabled telemedicine application and verified that I am speaking with the correct person using two identifiers.  I discussed the limitations of evaluation and management by telemedicine and the availability of in person appointments. The patient expressed understanding and agreed to proceed. Staff also discussed with the patient that there may be a patient responsible charge related to this service. Patient Location: home Provider Location:  [facility] Additional Individuals present:   HPI  Symptoms started last Friday, about 3 days ago, with common cold like sx, she has cough, nasal drainage, sneezing, coughing 99.9 Tmax,  Cough is non-productive since last Friday, cough is worsening est at night, and this morning she had coughing fit and called out of work. She has hx of asthma, was previously on Qvar maintenance inhaler but insurance coverage changes and she just started flovent daily and has albuterol PRN.  She has only used albuterol once a few days ago, no other OTC meds.   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   Denies sweats, CP, body aches, HA   Patient Active Problem List   Diagnosis Date Noted  . Left arm cellulitis 07/25/2017  . Postpartum care following vaginal delivery 06/24/2015  . Pregnancy 06/21/2015  . Low back pain 06/03/2015  . Back pain 05/31/2015  . Back pain affecting pregnancy, antepartum  03/30/2015  . Bilateral lower abdominal pain 02/27/2015  . Abdominal pain affecting pregnancy, antepartum 02/09/2015    Social History   Tobacco Use  . Smoking status: Never Smoker  . Smokeless tobacco: Never Used  Substance Use Topics  . Alcohol use: No     Current Outpatient Medications:  .  fluticasone (FLOVENT HFA) 110 MCG/ACT inhaler, Inhale 2 puffs into the lungs 2 (two) times daily. Rinse out mouth and spit after each use, Disp: 1 Inhaler, Rfl: 2 .  ibuprofen (ADVIL) 600 MG tablet, Take 1 tablet (600 mg total) by mouth every 6 (six) hours as needed for headache., Disp: 20 tablet, Rfl: 0 .  albuterol (PROVENTIL) (2.5 MG/3ML) 0.083% nebulizer solution, Take 3 mLs (2.5 mg total) by nebulization every 6 (six) hours as needed for up to 10 days for wheezing or shortness of breath., Disp: 90 mL, Rfl: 0  Allergies  Allergen Reactions  . Bee Venom Anaphylaxis  . Strawberry (Diagnostic) Anaphylaxis    I personally reviewed active problem list, medication list, allergies, family history, social history, health maintenance, notes from last encounter, lab results, imaging with the patient/caregiver today.   Review of Systems  10 Systems reviewed and are negative for acute change except as noted in the HPI.   Objective:   Virtual encounter, vitals limited, only able to obtain the following Today's Vitals   07/25/19 1102  Weight: 140 lb (63.5 kg)  Height: 5\' 1"  (1.549 m)   Body mass index is 26.45 kg/m. Nursing Note and Vital Signs reviewed.  Physical Exam Vitals and nursing note reviewed.  Constitutional:      General: She is not in acute distress.    Appearance: Normal appearance. She is obese. She is not toxic-appearing.  Pulmonary:     Comments: Intermittent coughing and sniffing, no retractions, tachypnea, accessory muscle use observed, no audible wheeze or stridor Neurological:     Mental Status: She is alert.  Psychiatric:        Mood and Affect: Mood normal.          Behavior: Behavior normal.     PE limited by telephone encounter  No results found for this or any previous visit (from the past 72 hour(s)).  Assessment and Plan:     ICD-10-CM   1. Upper respiratory tract infection, unspecified type  J06.9    viral URI sx with "low grade temp" 99.9, nasal sx, cough, cannot exclude COVID - needs testing and not currently cleared to go back to work  2. Moderate persistent asthma with exacerbation  J45.41    tx with prednisone burst and more frequent use of albuterol for coughing fits, wheeze, SOB, f/up if not improving in 3-5 d    For URI sx, encouraged pt to use OTC meds - supportive and sx tx antihistamine, cough meds, etc in addition to albuterol and prednisone.  Pt will arrange COVID testing since she is not yet vaccinated and must still exercise abundance of caution in light of COVID - pt reports no work , they just want a work note clearing her back to work.  Pt advised that she must get tested and quarantine per CDC  Pt to notify Psychologist, forensic of COVID test results and update on sx, esp development of fever   -Red flags and when to present for emergency care or RTC including chest pain, shortness of breath, new/worsening/un-resolving symptoms, reviewed with patient at time of visit. Follow up and care instructions discussed and provided in AVS. - I discussed the assessment and treatment plan with the patient. The patient was provided an opportunity to ask questions and all were answered. The patient agreed with the plan and demonstrated an understanding of the instructions.  I provided 20 minutes of non-face-to-face time during this encounter.  [place], PA-C 07/25/19 1:36 PM

## 2019-07-25 NOTE — Telephone Encounter (Signed)
Patient scheduled for phone visit with [name].   Copied from CRM [id number]. Topic: General - Inquiry >> Jul 25, 2019  9:57 AM Deborha Payment wrote: Reason for CRM: Patient is requesting an out of work letter.  Patient had a slight asthma attack with a slight fever.  Patient was not seen but is still requesting a letter. Call back [phone]

## 2019-08-01 ENCOUNTER — Emergency Department
Admission: EM | Admit: 2019-08-01 | Discharge: 2019-08-01 | Disposition: A | Discharge status: Home / Self Care | Payer: Medicaid Other | Attending: Student in an Organized Health Care Education/Training Program | Admitting: Student in an Organized Health Care Education/Training Program

## 2019-08-01 ENCOUNTER — Telehealth: Payer: Self-pay

## 2019-08-01 ENCOUNTER — Emergency Department: Payer: Medicaid Other

## 2019-08-01 ENCOUNTER — Other Ambulatory Visit: Payer: Self-pay

## 2019-08-01 ENCOUNTER — Encounter: Payer: Self-pay | Admitting: Emergency Medicine

## 2019-08-01 DIAGNOSIS — J45909 Unspecified asthma, uncomplicated: Secondary | ICD-10-CM | POA: Insufficient documentation

## 2019-08-01 DIAGNOSIS — F410 Panic disorder [episodic paroxysmal anxiety] without agoraphobia: Secondary | ICD-10-CM

## 2019-08-01 DIAGNOSIS — Z79899 Other long term (current) drug therapy: Secondary | ICD-10-CM | POA: Insufficient documentation

## 2019-08-01 DIAGNOSIS — Z7951 Long term (current) use of inhaled steroids: Secondary | ICD-10-CM | POA: Diagnosis not present

## 2019-08-01 DIAGNOSIS — R0602 Shortness of breath: Secondary | ICD-10-CM | POA: Diagnosis not present

## 2019-08-01 DIAGNOSIS — I10 Essential (primary) hypertension: Secondary | ICD-10-CM | POA: Diagnosis not present

## 2019-08-01 DIAGNOSIS — R911 Solitary pulmonary nodule: Secondary | ICD-10-CM | POA: Diagnosis not present

## 2019-08-01 LAB — CBC WITH DIFFERENTIAL/PLATELET
Abs Immature Granulocytes: 0.03 10*3/uL (ref 0.00–0.07)
Basophils Absolute: 0.1 10*3/uL (ref 0.0–0.1)
Basophils Relative: 1 %
Eosinophils Absolute: 0.2 10*3/uL (ref 0.0–0.5)
Eosinophils Relative: 2 %
HCT: 42.2 % (ref 36.0–46.0)
Hemoglobin: 14.7 g/dL (ref 12.0–15.0)
Immature Granulocytes: 0 %
Lymphocytes Relative: 33 %
Lymphs Abs: 3.4 10*3/uL (ref 0.7–4.0)
MCH: 30.2 pg (ref 26.0–34.0)
MCHC: 34.8 g/dL (ref 30.0–36.0)
MCV: 86.7 fL (ref 80.0–100.0)
Monocytes Absolute: 1.1 10*3/uL — ABNORMAL HIGH (ref 0.1–1.0)
Monocytes Relative: 11 %
Neutro Abs: 5.5 10*3/uL (ref 1.7–7.7)
Neutrophils Relative %: 53 %
Platelets: 316 10*3/uL (ref 150–400)
RBC: 4.87 MIL/uL (ref 3.87–5.11)
RDW: 12.4 % (ref 11.5–15.5)
WBC: 10.3 10*3/uL (ref 4.0–10.5)
nRBC: 0 % (ref 0.0–0.2)

## 2019-08-01 LAB — BASIC METABOLIC PANEL
Anion gap: 12 (ref 5–15)
BUN: 17 mg/dL (ref 6–20)
CO2: 18 mmol/L — ABNORMAL LOW (ref 22–32)
Calcium: 9.8 mg/dL (ref 8.9–10.3)
Chloride: 107 mmol/L (ref 98–111)
Creatinine, Ser: 1.01 mg/dL — ABNORMAL HIGH (ref 0.44–1.00)
GFR calc Af Amer: >60 mL/min (ref >60)
GFR calc non Af Amer: >60 mL/min (ref >60)
Glucose, Bld: 121 mg/dL — ABNORMAL HIGH (ref 70–99)
Potassium: 3.7 mmol/L (ref 3.5–5.1)
Sodium: 137 mmol/L (ref 135–145)

## 2019-08-01 LAB — URINE DRUG SCREEN, QUALITATIVE (ARMC ONLY)
Amphetamines, Ur Screen: NOT DETECTED
Barbiturates, Ur Screen: NOT DETECTED
Benzodiazepine, Ur Scrn: NOT DETECTED
Cannabinoid 50 Ng, Ur ~~LOC~~: POSITIVE — AB
Cocaine Metabolite,Ur ~~LOC~~: NOT DETECTED
MDMA (Ecstasy)Ur Screen: NOT DETECTED
Methadone Scn, Ur: NOT DETECTED
Opiate, Ur Screen: NOT DETECTED
Phencyclidine (PCP) Ur S: NOT DETECTED
Tricyclic, Ur Screen: NOT DETECTED

## 2019-08-01 LAB — TROPONIN I (HIGH SENSITIVITY)
Troponin I (High Sensitivity): 16 ng/L (ref <18)
Troponin I (High Sensitivity): <2 ng/L (ref <18)

## 2019-08-01 MED ORDER — LORAZEPAM 2 MG/ML IJ SOLN: 1.0000 mg | Freq: Once | INTRAMUSCULAR | Status: AC

## 2019-08-01 MED ORDER — LORAZEPAM 2 MG/ML IJ SOLN
INTRAMUSCULAR | Status: AC
  Administered 2019-08-01: 1 mg via INTRAVENOUS
  Filled 2019-08-01: qty 1

## 2019-08-01 MED ORDER — ALPRAZOLAM 0.25 MG PO TABS: 0.2500 mg | ORAL_TABLET | Freq: Three times a day (TID) | ORAL | 0 refills | Status: DC | PRN

## 2019-08-01 MED ORDER — LACTATED RINGERS IV BOLUS
1000.0000 mL | Freq: Once | INTRAVENOUS | Status: AC
  Administered 2019-08-01: 1000 mL via INTRAVENOUS

## 2019-08-01 MED ORDER — IOHEXOL 350 MG/ML SOLN
75.0000 mL | Freq: Once | INTRAVENOUS | Status: AC | PRN
  Administered 2019-08-01: 75 mL via INTRAVENOUS
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
## 2019-08-01 NOTE — ED Triage Notes (Signed)
Patient brought in by girl friend home. Patient having a panic attack. Patient hyperventilating and stating she is having difficulty breathing.

## 2019-08-01 NOTE — Telephone Encounter (Signed)
I spoke with the patient   Copied from CRM [id number]. Topic: General - Other >> Aug 01, 2019  1:04 PM Mcneil, [name] wrote: Reason for CRM: Pt stated she needs a doctor note due to not being able to send the information from [facility]. Pt stated she can not send the information to her employer without all her medical information being included. Pt requests call back once the doctor's note is completed

## 2019-08-01 NOTE — ED Provider Notes (Signed)
Patient received in signout from Dr.Jessup pending follow-up labs and reassessment.  Blood work is very reassuring.  On exam however patient still mildly tachypneic.  On appreciating significant wheezing.  Does have pleuritic chest pain.  Will order CTA to exclude PE or other signs of pulmonary pathology.  ----------------------------------------- 9:23 AM on 08/01/2019 -----------------------------------------  Patient reassessed.  Feels much improved.  Discussed results of reassuring CT imaging as well as serial enzymes.  Do believe she stable and appropriate for outpatient follow-up referral.   [name], MD 08/01/19 [phone]

## 2019-08-01 NOTE — ED Notes (Signed)
Pt otf for CT

## 2019-08-01 NOTE — ED Notes (Signed)
Pt states she came in for having a panic attach. Pt noted to have right arm shaking and states that is normal with her panic attacks. Pt states she woke up in her panic attack. Pts significant other at bedside. Pt noted to have an episode of heart rate in the 160s, prior to ativan given. Once ativan given, heart rate went to the 80s. ER provider was at bedside for episode where pts heart rate was in the 160s and when she was shaking. Pt placed on cardiac, bp and pulse ox monitor.

## 2019-08-01 NOTE — Telephone Encounter (Signed)
I spoke with the patient and she had no questions she only wanted her note for work to be sent through [employer].   opied from CRM [id number]. Topic: General - Call Back - No Documentation >> Aug 01, 2019  1:22 PM Randol Kern wrote: Pt called stating that her discharge orders include speaking to her doctor today for urgent questions. Please advise  Best contact: [phone]

## 2019-08-16 DIAGNOSIS — K529 Noninfective gastroenteritis and colitis, unspecified: Secondary | ICD-10-CM | POA: Diagnosis not present

## 2019-08-16 DIAGNOSIS — Z20822 Contact with and (suspected) exposure to covid-19: Secondary | ICD-10-CM | POA: Diagnosis not present

## 2019-08-30 ENCOUNTER — Encounter: Payer: Self-pay | Admitting: Family Medicine

## 2019-08-30 ENCOUNTER — Other Ambulatory Visit: Payer: Self-pay

## 2019-08-30 ENCOUNTER — Emergency Department
Admission: EM | Admit: 2019-08-30 | Discharge: 2019-08-30 | Disposition: A | Discharge status: Home / Self Care | Payer: Medicaid Other | Attending: Emergency Medicine | Admitting: Emergency Medicine

## 2019-08-30 ENCOUNTER — Emergency Department: Payer: Medicaid Other

## 2019-08-30 DIAGNOSIS — Z20822 Contact with and (suspected) exposure to covid-19: Secondary | ICD-10-CM | POA: Diagnosis not present

## 2019-08-30 DIAGNOSIS — R112 Nausea with vomiting, unspecified: Secondary | ICD-10-CM | POA: Diagnosis not present

## 2019-08-30 DIAGNOSIS — I10 Essential (primary) hypertension: Secondary | ICD-10-CM | POA: Insufficient documentation

## 2019-08-30 DIAGNOSIS — R05 Cough: Secondary | ICD-10-CM | POA: Diagnosis not present

## 2019-08-30 DIAGNOSIS — J45909 Unspecified asthma, uncomplicated: Secondary | ICD-10-CM | POA: Insufficient documentation

## 2019-08-30 DIAGNOSIS — R197 Diarrhea, unspecified: Secondary | ICD-10-CM | POA: Diagnosis not present

## 2019-08-30 DIAGNOSIS — R509 Fever, unspecified: Secondary | ICD-10-CM | POA: Insufficient documentation

## 2019-08-30 DIAGNOSIS — R0981 Nasal congestion: Secondary | ICD-10-CM | POA: Diagnosis not present

## 2019-08-30 DIAGNOSIS — Z79899 Other long term (current) drug therapy: Secondary | ICD-10-CM | POA: Insufficient documentation

## 2019-08-30 LAB — SARS CORONAVIRUS 2 BY RT PCR (HOSPITAL ORDER, PERFORMED IN ~~LOC~~ HOSPITAL LAB): SARS Coronavirus 2: NEGATIVE

## 2019-08-30 MED ORDER — ONDANSETRON 4 MG PO TBDP
4.0000 mg | ORAL_TABLET | Freq: Once | ORAL | Status: AC
  Administered 2019-08-30: 4 mg via ORAL
  Filled 2019-08-30: qty 1

## 2019-08-30 MED ORDER — PREDNISONE 20 MG PO TABS
60.0000 mg | ORAL_TABLET | Freq: Once | ORAL | Status: AC
  Administered 2019-08-30: 60 mg via ORAL
  Filled 2019-08-30: qty 3

## 2019-08-30 MED ORDER — ONDANSETRON 4 MG PO TBDP: 4.0000 mg | ORAL_TABLET | Freq: Three times a day (TID) | ORAL | 0 refills | Status: DC | PRN

## 2019-08-30 MED ORDER — PREDNISONE 20 MG PO TABS
40.0000 mg | ORAL_TABLET | Freq: Every day | ORAL | 0 refills | Status: AC
Start: 2019-08-30 — End: 2019-09-04

## 2019-08-30 MED ORDER — BENZONATATE 100 MG PO CAPS
ORAL_CAPSULE | ORAL | 0 refills | Status: DC
Start: 2019-08-30 — End: 2019-09-08
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
## 2019-08-30 NOTE — ED Notes (Signed)
Pt reports recently being exposed to covid and having n/v and diarrhea x2 days. Pt also reports headache and body aches

## 2019-08-30 NOTE — ED Triage Notes (Signed)
PT arrives via POV for reports of nausea, vomiting, diarrhea, fever, cough, congestion that started yesterday. Pt reports going to a wedding on Saturday where several people she was around tested positive for covid. Pt in NAD, skin warm and dry, pt speakiing in complete sentences without shob.

## 2019-08-30 NOTE — Discharge Instructions (Signed)
You are being treated for COVID symptoms after exposure. Take the prescription meds as directed. Continue to use your inhaler as prescribed. Follow-up with your provider or return as needed. Remain under quarantine until symptoms improve.

## 2019-08-31 ENCOUNTER — Telehealth: Payer: Self-pay

## 2019-08-31 NOTE — Telephone Encounter (Signed)
Patient was exposed to covid, she believes she was tested too early. She has recently started to experience fever, cough, nausea, diarrhea, nose is burning. She will be tested again in 5 days per ER recommendation.  Patient was coughing continuously while I was on the phone with her. She needs a letter for work specifically stating she should quarantine.    Copied from CRM [id number]. Topic: General - Other >> Aug 30, 2019  5:09 PM Marylen Ponto wrote: Reason for CRM: Pt stated she went to the hospital due to covid exposure and her employer is requesting more specific details in regards to her having to quarantine. Pt requests call back to discuss

## 2019-09-06 ENCOUNTER — Encounter: Payer: Self-pay | Admitting: Internal Medicine

## 2019-09-07 ENCOUNTER — Telehealth: Payer: Self-pay

## 2019-09-07 ENCOUNTER — Ambulatory Visit: Payer: Self-pay

## 2019-09-07 NOTE — Telephone Encounter (Signed)
Pt. Reports her COVID 19 test through [facility] has come back positive. Reports she still has a dry cough and low grade fever. Has fatigue and loss of appetite. No availability for virtual visit per [facility] in the practice. Pt. Will do a My Chart e-visit. Instructed to go to ED for worsening of symptoms. Verbalizes understanding.  Reason for Disposition . [1] Fever > 100.0 F (37.8 C) AND [2] bedridden (e.g., nursing home patient, CVA, chronic illness, recovering from surgery)  Answer Assessment - Initial Assessment Questions 1. COVID-19 DIAGNOSIS: "Who made your Coronavirus (COVID-19) diagnosis?" "Was it confirmed by a positive lab test?" If not diagnosed by a HCP, ask "Are there lots of cases (community spread) where you live?" (See public health department website, if unsure)     [facility] did test - positive 2. COVID-19 EXPOSURE: "Was there any known exposure to COVID before the symptoms began?" CDC Definition of close contact: within 6 feet (2 meters) for a total of 15 minutes or more over a 24-hour period.      Yes 3. ONSET: "When did the COVID-19 symptoms start?"      08/30/19 4. WORST SYMPTOM: "What is your worst symptom?" (e.g., cough, fever, shortness of breath, muscle aches)     Cough 5. COUGH: "Do you have a cough?" If Yes, ask: "How bad is the cough?"       Moderate 6. FEVER: "Do you have a fever?" If Yes, ask: "What is your temperature, how was it measured, and when did it start?"     Low grade 7. RESPIRATORY STATUS: "Describe your breathing?" (e.g., shortness of breath, wheezing, unable to speak)      Coughing causing shortness, coughing at night 8. BETTER-SAME-WORSE: "Are you getting better, staying the same or getting worse compared to yesterday?"  If getting worse, ask, "In what way?"     Worse - achy 9. HIGH RISK DISEASE: "Do you have any chronic medical problems?" (e.g., asthma, heart or lung disease, weak immune system, obesity, etc.)     Astma 10. PREGNANCY:  "Is there any chance you are pregnant?" "When was your last menstrual period?"       No 11. OTHER SYMPTOMS: "Do you have any other symptoms?"  (e.g., chills, fatigue, headache, loss of smell or taste, muscle pain, sore throat; new loss of smell or taste especially support the diagnosis of COVID-19)       Fatigue, body aches  Protocols used: CORONAVIRUS (COVID-19) DIAGNOSED OR SUSPECTED-A-AH

## 2019-09-07 NOTE — Telephone Encounter (Signed)
Copied from CRM [id number]. Topic: General - Call Back - No Documentation >> Sep 06, 2019  2:04 PM Randol Kern wrote: Best contact: [phone]  Pt wants a call back from clinic regarding supplemental vitamin recommendations please advise

## 2019-09-08 ENCOUNTER — Other Ambulatory Visit: Payer: Self-pay

## 2019-09-08 ENCOUNTER — Telehealth (INDEPENDENT_AMBULATORY_CARE_PROVIDER_SITE_OTHER): Payer: Medicaid Other | Admitting: Family Medicine

## 2019-09-08 ENCOUNTER — Encounter: Payer: Self-pay | Admitting: Family Medicine

## 2019-09-08 VITALS — Temp 99.8°F | Ht 61.0 in

## 2019-09-08 DIAGNOSIS — U071 COVID-19: Secondary | ICD-10-CM

## 2019-09-08 DIAGNOSIS — J4541 Moderate persistent asthma with (acute) exacerbation: Secondary | ICD-10-CM

## 2019-09-08 DIAGNOSIS — R197 Diarrhea, unspecified: Secondary | ICD-10-CM | POA: Diagnosis not present

## 2019-09-08 DIAGNOSIS — J069 Acute upper respiratory infection, unspecified: Secondary | ICD-10-CM

## 2019-09-08 MED ORDER — ALBUTEROL SULFATE 108 (90 BASE) MCG/ACT IN AEPB: 2.0000 | INHALATION_SPRAY | RESPIRATORY_TRACT | 1 refills | Status: DC | PRN

## 2019-09-08 MED ORDER — PREDNISONE 20 MG PO TABS
ORAL_TABLET | ORAL | 0 refills | Status: DC
Start: 2019-09-08 — End: 2020-01-09

## 2019-09-08 MED ORDER — BENZONATATE 100 MG PO CAPS: ORAL_CAPSULE | ORAL | 0 refills | Status: DC

## 2019-09-08 MED ORDER — IPRATROPIUM-ALBUTEROL 0.5-2.5 (3) MG/3ML IN SOLN: 3.0000 mL | Freq: Four times a day (QID) | RESPIRATORY_TRACT | 1 refills | Status: DC | PRN

## 2019-09-08 NOTE — Progress Notes (Signed)
Name: Julia Cherry   MRN: [id number]    DOB: [birth year]   Date:09/08/2019       Progress Note  Subjective:    Chief Complaint  Chief Complaint  Patient presents with  . Cough    Patient tested positive for covid this week, would like to discuss OTC mediccations for treatment  . Nasal Congestion  . Fatigue  . Headache    I connected with  [name] on 09/08/19 at  1:20 PM EDT by telephone and verified that I am speaking with the correct person using two identifiers.   I discussed the limitations, risks, security and privacy concerns of performing an evaluation and management service by telephone and the availability of in person appointments. Staff also discussed with the patient that there may be a patient responsible charge related to this service.  Patient verbalized understanding and willingness to proceed with virtual encounter today Patient Location:  home Provider Location:  [facility] clinic Additional Individuals present:    HPI Pt had exposure to COVID on 08/27/2019 Tested for exposure on 8/17, retested 8/19 and it was positive - sx were present on 8/17 Low grade fever, GI sx, scratchy throat, coughing, some CP when laying down at night, some fatigue, and diarrhea Sx started on Sunday to be noticeable   Wedding in [place] of 100-150 people and so far 80 people positive  Pt feels dehydrated -  Not vomiting anymore  Cough and chest sx are getting worse, she's using inhaler more    Patient Active Problem List   Diagnosis Date Noted  . Left arm cellulitis 07/25/2017  . Postpartum care following vaginal delivery 06/24/2015  . Pregnancy 06/21/2015  . Low back pain 06/03/2015  . Back pain 05/31/2015  . Back pain affecting pregnancy, antepartum 03/30/2015  . Bilateral lower abdominal pain 02/27/2015  . Abdominal pain affecting pregnancy, antepartum 02/09/2015    Social History   Tobacco Use  . Smoking status: Never Smoker  . Smokeless  tobacco: Never Used  Substance Use Topics  . Alcohol use: No     Current Outpatient Medications:  .  acetaminophen (TYLENOL) 500 MG tablet, Take 500 mg by mouth every 6 (six) hours as needed., Disp: , Rfl:  .  ALPRAZolam (XANAX) 0.25 MG tablet, Take 1 tablet (0.25 mg total) by mouth every 8 (eight) hours as needed for anxiety., Disp: 10 tablet, Rfl: 0 .  fluticasone (FLOVENT HFA) 110 MCG/ACT inhaler, Inhale 2 puffs into the lungs 2 (two) times daily. Rinse out mouth and spit after each use, Disp: 1 Inhaler, Rfl: 2 .  albuterol (PROVENTIL) (2.5 MG/3ML) 0.083% nebulizer solution, Take 3 mLs (2.5 mg total) by nebulization every 6 (six) hours as needed for up to 10 days for wheezing or shortness of breath., Disp: 90 mL, Rfl: 0 .  benzonatate (TESSALON PERLES) 100 MG capsule, Take 1-2 tabs TID prn cough (Patient not taking: Reported on 09/08/2019), Disp: 30 capsule, Rfl: 0  Allergies  Allergen Reactions  . Bee Venom Anaphylaxis  . Strawberry (Diagnostic) Anaphylaxis    Chart Review: I personally reviewed active problem list, medication list, allergies, family history, social history, health maintenance, notes from last encounter, lab results, imaging with the patient/caregiver today.   Review of Systems 10 Systems reviewed and are negative for acute change except as noted in the HPI.    Objective:    Virtual encounter, vitals limited, only able to obtain the following Today's Vitals   09/08/19 1124  Temp: 99.8  F (37.7 C)  TempSrc: Oral  Height: 5\' 1"  (1.549 m)   Body mass index is 27.4 kg/m. Nursing Note and Vital Signs reviewed.  Physical Exam Vitals and nursing note reviewed.  Constitutional:      General: She is not in acute distress.    Appearance: She is normal weight. She is not ill-appearing or toxic-appearing.  Pulmonary:     Effort: No respiratory distress.  Neurological:     Mental Status: She is alert.  Psychiatric:        Mood and Affect: Mood normal.         Behavior: Behavior normal.     PE limited by telephone encounter  No results found for this or any previous visit (from the past 72 hour(s)).  Assessment and Plan:     ICD-10-CM   1. Upper respiratory tract infection due to COVID-19 virus  U07.1    J06.9    reviewed isolation criteria with pt  2. Diarrhea, unspecified type  R19.7    push clear fluids with electrolytes, it has been improving, rest, hydrate, advance diet slowly  3. Moderate persistent asthma with exacerbation  J45.41    oral steroid for worsening respiratory sx with asthma and COVID - use cough meds, steroids, inhaler - ER if worsening SOB, CP, extremely HR or hypoxia   Hx of asthma - encouraged her to use albuterol more if needed for coughing, OTC and supportive tx reviewed Reviewed emergent Signs and sx that she needs to get seen for in ER -  Referred to [facility] COVID management resources.    -Red flags and when to present for emergency care or RTC including but not limited to new/worsening/un-resolving symptoms, reviewed with patient at time of visit. Follow up and care instructions discussed and provided in AVS. - I discussed the assessment and treatment plan with the patient. The patient was provided an opportunity to ask questions and all were answered. The patient agreed with the plan and demonstrated an understanding of the instructions.  - The patient was advised to call back or seek an in-person evaluation if the symptoms worsen or if the condition fails to improve as anticipated.  I provided 20+ minutes of non-face-to-face time during this encounter.  [facility], PA-C 09/08/19 11:28 AM

## 2019-09-08 NOTE — Telephone Encounter (Signed)
Patient scheduled for 09/08/19 @ 1:20, telephone visit.

## 2020-01-09 ENCOUNTER — Encounter: Payer: Self-pay | Admitting: Emergency Medicine

## 2020-01-09 ENCOUNTER — Emergency Department
Admission: EM | Admit: 2020-01-09 | Discharge: 2020-01-09 | Disposition: A | Discharge status: Home / Self Care | Payer: Medicaid Other | Attending: Emergency Medicine | Admitting: Emergency Medicine

## 2020-01-09 ENCOUNTER — Emergency Department: Payer: Medicaid Other

## 2020-01-09 ENCOUNTER — Other Ambulatory Visit: Payer: Self-pay

## 2020-01-09 DIAGNOSIS — J069 Acute upper respiratory infection, unspecified: Secondary | ICD-10-CM

## 2020-01-09 DIAGNOSIS — J45901 Unspecified asthma with (acute) exacerbation: Secondary | ICD-10-CM | POA: Diagnosis not present

## 2020-01-09 DIAGNOSIS — R059 Cough, unspecified: Secondary | ICD-10-CM | POA: Diagnosis not present

## 2020-01-09 DIAGNOSIS — Z7951 Long term (current) use of inhaled steroids: Secondary | ICD-10-CM | POA: Diagnosis not present

## 2020-01-09 DIAGNOSIS — Z20822 Contact with and (suspected) exposure to covid-19: Secondary | ICD-10-CM | POA: Diagnosis not present

## 2020-01-09 DIAGNOSIS — R509 Fever, unspecified: Secondary | ICD-10-CM | POA: Diagnosis not present

## 2020-01-09 DIAGNOSIS — I10 Essential (primary) hypertension: Secondary | ICD-10-CM | POA: Diagnosis not present

## 2020-01-09 DIAGNOSIS — R0602 Shortness of breath: Secondary | ICD-10-CM | POA: Diagnosis not present

## 2020-01-09 LAB — BASIC METABOLIC PANEL
Anion gap: 8 (ref 5–15)
BUN: 12 mg/dL (ref 6–20)
CO2: 23 mmol/L (ref 22–32)
Calcium: 8.6 mg/dL — ABNORMAL LOW (ref 8.9–10.3)
Chloride: 104 mmol/L (ref 98–111)
Creatinine, Ser: 0.55 mg/dL (ref 0.44–1.00)
GFR, Estimated: >60 mL/min (ref >60)
Glucose, Bld: 103 mg/dL — ABNORMAL HIGH (ref 70–99)
Potassium: 3.5 mmol/L (ref 3.5–5.1)
Sodium: 135 mmol/L (ref 135–145)

## 2020-01-09 LAB — CBC
HCT: 36 % (ref 36.0–46.0)
Hemoglobin: 12.5 g/dL (ref 12.0–15.0)
MCH: 30.6 pg (ref 26.0–34.0)
MCHC: 34.7 g/dL (ref 30.0–36.0)
MCV: 88.2 fL (ref 80.0–100.0)
Platelets: 241 10*3/uL (ref 150–400)
RBC: 4.08 MIL/uL (ref 3.87–5.11)
RDW: 12.8 % (ref 11.5–15.5)
WBC: 12.7 10*3/uL — ABNORMAL HIGH (ref 4.0–10.5)
nRBC: 0 % (ref 0.0–0.2)

## 2020-01-09 LAB — RESP PANEL BY RT-PCR (FLU A&B, COVID) ARPGX2
Influenza A by PCR: NEGATIVE
Influenza B by PCR: NEGATIVE
SARS Coronavirus 2 by RT PCR: NEGATIVE

## 2020-01-09 LAB — TROPONIN I (HIGH SENSITIVITY): Troponin I (High Sensitivity): <2 ng/L (ref <18)

## 2020-01-09 MED ORDER — PREDNISONE 20 MG PO TABS
60.0000 mg | ORAL_TABLET | Freq: Once | ORAL | Status: AC
  Administered 2020-01-09: 60 mg via ORAL
  Filled 2020-01-09: qty 3

## 2020-01-09 MED ORDER — ALBUTEROL SULFATE 108 (90 BASE) MCG/ACT IN AEPB: 2.0000 | INHALATION_SPRAY | RESPIRATORY_TRACT | 1 refills | Status: DC | PRN

## 2020-01-09 MED ORDER — PREDNISONE 20 MG PO TABS: 40.0000 mg | ORAL_TABLET | Freq: Every day | ORAL | 0 refills | Status: DC
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
## 2020-01-09 NOTE — Discharge Instructions (Signed)
Your chest x-ray appears normal and your Covid and flu test were negative.

## 2020-01-09 NOTE — ED Triage Notes (Signed)
Patient states that she started running a fever of 99.9 Saturday morning. Patient states that she went to work today and was told that other employees were diagnosed with Covid. Patient reports that she started coughing, chest pain and shortness of breath that started this morning.

## 2020-01-25 ENCOUNTER — Encounter: Payer: Self-pay | Admitting: Family Medicine

## 2020-05-21 ENCOUNTER — Emergency Department
Admission: EM | Admit: 2020-05-21 | Discharge: 2020-05-21 | Disposition: A | Discharge status: Home / Self Care | Payer: Medicaid Other | Attending: Emergency Medicine | Admitting: Emergency Medicine

## 2020-05-21 ENCOUNTER — Other Ambulatory Visit: Payer: Self-pay

## 2020-05-21 DIAGNOSIS — Z23 Encounter for immunization: Secondary | ICD-10-CM | POA: Diagnosis not present

## 2020-05-21 DIAGNOSIS — W275XXA Contact with paper-cutter, initial encounter: Secondary | ICD-10-CM | POA: Insufficient documentation

## 2020-05-21 DIAGNOSIS — S6991XA Unspecified injury of right wrist, hand and finger(s), initial encounter: Secondary | ICD-10-CM | POA: Diagnosis present

## 2020-05-21 DIAGNOSIS — J45909 Unspecified asthma, uncomplicated: Secondary | ICD-10-CM | POA: Diagnosis not present

## 2020-05-21 DIAGNOSIS — S61212A Laceration without foreign body of right middle finger without damage to nail, initial encounter: Secondary | ICD-10-CM | POA: Diagnosis not present

## 2020-05-21 DIAGNOSIS — I10 Essential (primary) hypertension: Secondary | ICD-10-CM | POA: Insufficient documentation

## 2020-05-21 DIAGNOSIS — S61222A Laceration with foreign body of right middle finger without damage to nail, initial encounter: Secondary | ICD-10-CM

## 2020-05-21 DIAGNOSIS — Z7951 Long term (current) use of inhaled steroids: Secondary | ICD-10-CM | POA: Insufficient documentation

## 2020-05-21 MED ORDER — LIDOCAINE-EPINEPHRINE 2 %-1:100000 IJ SOLN: 1.7000 mL | Freq: Once | INTRAMUSCULAR | Status: DC

## 2020-05-21 MED ORDER — LIDOCAINE HCL (PF) 1 % IJ SOLN: 5.0000 mL | Freq: Once | INTRAMUSCULAR | Status: AC

## 2020-05-21 MED ORDER — LIDOCAINE HCL (PF) 1 % IJ SOLN
INTRAMUSCULAR | Status: AC
  Administered 2020-05-21: 5 mL via INTRADERMAL
  Filled 2020-05-21: qty 5

## 2020-05-21 MED ORDER — TETANUS-DIPHTH-ACELL PERTUSSIS 5-2.5-18.5 LF-MCG/0.5 IM SUSY
0.5000 mL | PREFILLED_SYRINGE | Freq: Once | INTRAMUSCULAR | Status: AC
  Administered 2020-05-21: 0.5 mL via INTRAMUSCULAR
  Filled 2020-05-21: qty 0.5

## 2020-05-21 NOTE — ED Notes (Signed)
Pt attempted to sign the signature pad, pad not working, verbalized understanding to this RN

## 2020-05-21 NOTE — ED Notes (Signed)
See triage note  Presents with laceration to right middle finger  States she cut herself with a box cutter

## 2020-05-21 NOTE — ED Triage Notes (Signed)
Pt states that she reached into her bag and didn't realize her box cutter was open and sliced her right middle finger at 2230 lat pm, states that it has cont to bleed since

## 2020-05-21 NOTE — ED Notes (Signed)
Pt states that she reached into her bag and states that her box cutter was open and sliced her right 3rd finger. Pt states that she was concerned due to it cont to bleed
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
## 2020-06-07 ENCOUNTER — Telehealth: Payer: Medicaid Other | Admitting: Family Medicine

## 2020-07-05 ENCOUNTER — Encounter: Payer: Self-pay | Admitting: Emergency Medicine

## 2020-07-05 ENCOUNTER — Emergency Department
Admission: EM | Admit: 2020-07-05 | Discharge: 2020-07-05 | Disposition: A | Discharge status: Home / Self Care | Payer: Medicaid Other | Attending: Emergency Medicine | Admitting: Emergency Medicine

## 2020-07-05 ENCOUNTER — Other Ambulatory Visit: Payer: Self-pay

## 2020-07-05 ENCOUNTER — Emergency Department: Payer: Medicaid Other

## 2020-07-05 DIAGNOSIS — U071 COVID-19: Secondary | ICD-10-CM | POA: Diagnosis not present

## 2020-07-05 DIAGNOSIS — I10 Essential (primary) hypertension: Secondary | ICD-10-CM | POA: Insufficient documentation

## 2020-07-05 DIAGNOSIS — J45909 Unspecified asthma, uncomplicated: Secondary | ICD-10-CM | POA: Diagnosis not present

## 2020-07-05 DIAGNOSIS — Z79899 Other long term (current) drug therapy: Secondary | ICD-10-CM | POA: Diagnosis not present

## 2020-07-05 DIAGNOSIS — R000 Tachycardia, unspecified: Secondary | ICD-10-CM | POA: Diagnosis not present

## 2020-07-05 DIAGNOSIS — R509 Fever, unspecified: Secondary | ICD-10-CM | POA: Diagnosis present

## 2020-07-05 DIAGNOSIS — R059 Cough, unspecified: Secondary | ICD-10-CM | POA: Diagnosis not present

## 2020-07-05 LAB — COMPREHENSIVE METABOLIC PANEL
ALT: 16 U/L (ref 0–44)
AST: 16 U/L (ref 15–41)
Albumin: 3.9 g/dL (ref 3.5–5.0)
Alkaline Phosphatase: 37 U/L — ABNORMAL LOW (ref 38–126)
Anion gap: 6 (ref 5–15)
BUN: 15 mg/dL (ref 6–20)
CO2: 29 mmol/L (ref 22–32)
Calcium: 9 mg/dL (ref 8.9–10.3)
Chloride: 102 mmol/L (ref 98–111)
Creatinine, Ser: 0.78 mg/dL (ref 0.44–1.00)
GFR, Estimated: >60 mL/min (ref >60)
Glucose, Bld: 95 mg/dL (ref 70–99)
Potassium: 3.6 mmol/L (ref 3.5–5.1)
Sodium: 137 mmol/L (ref 135–145)
Total Bilirubin: 0.6 mg/dL (ref 0.3–1.2)
Total Protein: 7.4 g/dL (ref 6.5–8.1)

## 2020-07-05 LAB — URINALYSIS, COMPLETE (UACMP) WITH MICROSCOPIC
Bacteria, UA: NONE SEEN
Bilirubin Urine: NEGATIVE
Glucose, UA: NEGATIVE mg/dL
Hgb urine dipstick: NEGATIVE
Ketones, ur: NEGATIVE mg/dL
Leukocytes,Ua: NEGATIVE
Nitrite: NEGATIVE
Protein, ur: NEGATIVE mg/dL
Specific Gravity, Urine: 1.026 (ref 1.005–1.030)
pH: 7 (ref 5.0–8.0)

## 2020-07-05 LAB — CBC WITH DIFFERENTIAL/PLATELET
Abs Immature Granulocytes: 0.04 10*3/uL (ref 0.00–0.07)
Basophils Absolute: 0 10*3/uL (ref 0.0–0.1)
Basophils Relative: 0 %
Eosinophils Absolute: 0.1 10*3/uL (ref 0.0–0.5)
Eosinophils Relative: 1 %
HCT: 40 % (ref 36.0–46.0)
Hemoglobin: 13.5 g/dL (ref 12.0–15.0)
Immature Granulocytes: 1 %
Lymphocytes Relative: 4 %
Lymphs Abs: 0.3 10*3/uL — ABNORMAL LOW (ref 0.7–4.0)
MCH: 30.3 pg (ref 26.0–34.0)
MCHC: 33.8 g/dL (ref 30.0–36.0)
MCV: 89.7 fL (ref 80.0–100.0)
Monocytes Absolute: 0.7 10*3/uL (ref 0.1–1.0)
Monocytes Relative: 8 %
Neutro Abs: 7.4 10*3/uL (ref 1.7–7.7)
Neutrophils Relative %: 86 %
Platelets: 238 10*3/uL (ref 150–400)
RBC: 4.46 MIL/uL (ref 3.87–5.11)
RDW: 12.3 % (ref 11.5–15.5)
WBC: 8.5 10*3/uL (ref 4.0–10.5)
nRBC: 0 % (ref 0.0–0.2)

## 2020-07-05 LAB — POC URINE PREG, ED: Preg Test, Ur: NEGATIVE

## 2020-07-05 LAB — RESP PANEL BY RT-PCR (FLU A&B, COVID) ARPGX2
Influenza A by PCR: NEGATIVE
Influenza B by PCR: NEGATIVE
SARS Coronavirus 2 by RT PCR: POSITIVE — AB

## 2020-07-05 MED ORDER — ACETAMINOPHEN 500 MG PO TABS
1000.0000 mg | ORAL_TABLET | Freq: Once | ORAL | Status: AC
  Administered 2020-07-05: 1000 mg via ORAL
  Filled 2020-07-05: qty 2

## 2020-07-05 MED ORDER — KETOROLAC TROMETHAMINE 30 MG/ML IJ SOLN
30.0000 mg | Freq: Once | INTRAMUSCULAR | Status: AC
  Administered 2020-07-05: 30 mg via INTRAMUSCULAR
  Filled 2020-07-05: qty 1

## 2020-07-05 MED ORDER — PAXLOVID 10 X 150 MG & 10 X 100MG PO TBPK: 2.0000 | ORAL_TABLET | Freq: Two times a day (BID) | ORAL | 0 refills | Status: AC

## 2020-07-05 MED ORDER — SODIUM CHLORIDE 0.9 % IV BOLUS (SEPSIS)
500.0000 mL | Freq: Once | INTRAVENOUS | Status: AC
  Administered 2020-07-05: 500 mL via INTRAVENOUS

## 2020-07-05 MED ORDER — ONDANSETRON 4 MG PO TBDP: ORAL_TABLET | ORAL | 0 refills | Status: DC

## 2020-07-05 MED ORDER — ACETAMINOPHEN 325 MG PO TABS
650.0000 mg | ORAL_TABLET | Freq: Once | ORAL | Status: AC
  Administered 2020-07-05: 650 mg via ORAL
  Filled 2020-07-05: qty 2

## 2020-07-05 MED ORDER — ACETAMINOPHEN 500 MG PO TABS
1000.0000 mg | ORAL_TABLET | Freq: Once | ORAL | Status: DC
  Filled 2020-07-05: qty 2

## 2020-07-05 MED ORDER — ONDANSETRON 4 MG PO TBDP
4.0000 mg | ORAL_TABLET | Freq: Once | ORAL | Status: AC
  Administered 2020-07-05: 4 mg via ORAL
  Filled 2020-07-05: qty 1

## 2020-07-05 NOTE — ED Notes (Signed)
MD aware of VS. MD in room to speak with pt prior to discharge. MD advised pt on fever reduction interventions, discharge instructions, and pt verbalized understanding.

## 2020-07-05 NOTE — Discharge Instructions (Addendum)
As we discussed, although you have tested positive for COVID-19 (coronavirus), you do not need to be hospitalized at this time.  Read through all the included information including the recommendations from the CDC.  We recommend that you self-quarantine at home with your immediate family only (people with whom you have already been in contact) for about a week after your fever has gone away (without taking medication to make your temperature come down, such as Tylenol (acetaminophen)), after your respiratory symptoms have improved.  You should have as minimal contact as possible with anyone else including close family as per the CDC paperwork guidelines listed below. Follow-up with your doctor by phone or online as needed and return immediately to the emergency department or call 911 only if you develop new or worsening symptoms that concern you.  If you were prescribed any medications, please use them as instructed. ° °You can find up-to-date information about COVID-19 in [place] by calling the [place] Coronavirus Helpline: 1-866-462-3821. You may also call 2-1-1, or 888-892-1162, or additional resources.  You can also find information online at https://www.ncdhhs.gov/divisions/public-health/coronavirus-disease-2019-covid-19-response-[place], or on the Center for Disease Control (CDC) website at https://www.cdc.gov/coronavirus/2019-ncov/index.html. °

## 2020-07-05 NOTE — ED Provider Notes (Signed)
8:45 AM Assumed care for off going team.   Blood pressure 125/73, pulse (!) 108, temperature 98.8 F (37.1 C), temperature source Oral, resp. rate 20, height 5\' 2"  (1.575 m), weight 61.2 kg, last menstrual period 06/21/2020, SpO2 99 %, unknown if currently breastfeeding.  See their HPI for full report but in brief patient had been cleared for discharge but upon discharge her heart rate was elevated and patient was febrile.  Her elevated heart rates are secondary to being febrile.  Patient last had Tylenol over 6 hours ago so we will give an additional 650 mg and a dose of Toradol to help with the fevers.  Discussed with patient different options including observing her to make sure her vital signs are resolving but patient states that she feels comfortable going home which I think is reasonable given we know that this is secondary to her COVID.         08/21/2020, MD 07/05/20 [phone]
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
## 2020-07-05 NOTE — ED Triage Notes (Addendum)
Patient ambulatory to triage with steady gait, without difficulty or distress noted; pt reports temp 100.5, nausea, body aches tonight & HA for several days; also reports cough & congestion

## 2020-07-10 ENCOUNTER — Encounter: Payer: Self-pay | Admitting: Unknown Physician Specialty

## 2020-07-10 ENCOUNTER — Other Ambulatory Visit: Payer: Self-pay

## 2020-07-10 ENCOUNTER — Other Ambulatory Visit: Payer: Self-pay | Admitting: Unknown Physician Specialty

## 2020-07-10 ENCOUNTER — Telehealth (INDEPENDENT_AMBULATORY_CARE_PROVIDER_SITE_OTHER): Payer: Medicaid Other | Admitting: Unknown Physician Specialty

## 2020-07-10 DIAGNOSIS — U071 COVID-19: Secondary | ICD-10-CM | POA: Diagnosis not present

## 2020-07-10 DIAGNOSIS — R059 Cough, unspecified: Secondary | ICD-10-CM | POA: Diagnosis not present

## 2020-07-10 MED ORDER — GUAIFENESIN-CODEINE 100-10 MG/5ML PO SOLN: 10.0000 mL | Freq: Three times a day (TID) | ORAL | 0 refills | Status: DC | PRN

## 2020-07-10 MED ORDER — ALBUTEROL SULFATE 108 (90 BASE) MCG/ACT IN AEPB: 2.0000 | INHALATION_SPRAY | RESPIRATORY_TRACT | 1 refills | Status: DC | PRN

## 2020-07-10 NOTE — Patient Instructions (Addendum)
Vid D - 4,000 Us/day Zinc - 2 times/day Quercetin - 2 times day Vit C 500 mg 3 times/day

## 2020-07-10 NOTE — Progress Notes (Signed)
LMP 06/21/2020 (Exact Date)    Subjective:    Patient ID: Julia Cherry, female    DOB: January 29, 1990, 30 y.o.   MRN: [id number]  HPI: Julia Cherry is a 30 y.o. female  Chief Complaint  Patient presents with   Covid Positive    Seen at ER   Due to the catastrophic nature of the COVID-19 pandemic, this visit was completed via audio and visual contact via Caregility due to the restrictions of the COVID-19 pandemic. All issues as above were discussed and addressed. Physical exam was done as above through visual confirmation on CaregilityIf it was felt that the patient should be evaluated in the office, they were directed there. The patient verbally consented to this visit."} Location of the patient: home Location of the provider: work Those involved with this call:  Provider: [name], DNP Time spent on call: 15 minutes with 5 minute chart review I verified patient identity using two factors (patient name and date of birth). Patient consents verbally to being seen via telemedicine visit today.  URI  This is a new (No immunizations) problem. The current episode started in the past 7 days (Day 6. ER 5 days ago,.  Given Paxlovid but only took 2 doses as it caused abdominal pain and diarrhea). The problem has been unchanged. Maximum temperature: Previous fever. Associated symptoms include abdominal pain, congestion, coughing, diarrhea, headaches and nausea. Pertinent negatives include no dysuria, ear pain, sneezing, sore throat or vomiting. Treatments tried: Paxlovid as above.  Tylenol. The treatment provided mild relief.   Relevant past medical, surgical, family and social history reviewed and updated as indicated. Interim medical history since our last visit reviewed. Allergies and medications reviewed and updated.  Review of Systems  HENT:  Positive for congestion. Negative for ear pain, sneezing and sore throat.   Respiratory:  Positive for cough.    Gastrointestinal:  Positive for abdominal pain, diarrhea and nausea. Negative for vomiting.  Genitourinary:  Negative for dysuria.  Neurological:  Positive for headaches.   Per HPI unless specifically indicated above     Objective:    LMP 06/21/2020 (Exact Date)   Wt Readings from Last 3 Encounters:  07/05/20 135 lb (61.2 kg)  05/21/20 140 lb (63.5 kg)  01/09/20 140 lb (63.5 kg)    Physical Exam Constitutional:      General: She is not in acute distress.    Appearance: Normal appearance. She is well-developed.  HENT:     Head: Normocephalic and atraumatic.  Eyes:     General: Lids are normal. No scleral icterus.       Right eye: No discharge.        Left eye: No discharge.     Conjunctiva/sclera: Conjunctivae normal.  Cardiovascular:     Rate and Rhythm: Normal rate.  Pulmonary:     Effort: Pulmonary effort is normal.  Abdominal:     Palpations: There is no hepatomegaly or splenomegaly.  Musculoskeletal:        General: Normal range of motion.  Skin:    Coloration: Skin is not pale.     Findings: No rash.  Neurological:     Mental Status: She is alert and oriented to person, place, and time.  Psychiatric:        Behavior: Behavior normal.        Thought Content: Thought content normal.        Judgment: Judgment normal.    Results for orders placed or performed during the  hospital encounter of 07/05/20  Resp Panel by RT-PCR (Flu A&B, Covid) Nasopharyngeal Swab   Specimen: Nasopharyngeal Swab; Nasopharyngeal(NP) swabs in vial transport medium  Result Value Ref Range   SARS Coronavirus 2 by RT PCR POSITIVE (A) NEGATIVE   Influenza A by PCR NEGATIVE NEGATIVE   Influenza B by PCR NEGATIVE NEGATIVE  Comprehensive metabolic panel  Result Value Ref Range   Sodium 137 135 - 145 mmol/L   Potassium 3.6 3.5 - 5.1 mmol/L   Chloride 102 98 - 111 mmol/L   CO2 29 22 - 32 mmol/L   Glucose, Bld 95 70 - 99 mg/dL   BUN 15 6 - 20 mg/dL   Creatinine, Ser 2.42 0.44 - 1.00 mg/dL    Calcium 9.0 8.9 - 35.3 mg/dL   Total Protein 7.4 6.5 - 8.1 g/dL   Albumin 3.9 3.5 - 5.0 g/dL   AST 16 15 - 41 U/L   ALT 16 0 - 44 U/L   Alkaline Phosphatase 37 (L) 38 - 126 U/L   Total Bilirubin 0.6 0.3 - 1.2 mg/dL   GFR, Estimated >61 >44 mL/min   Anion gap 6 5 - 15  CBC WITH DIFFERENTIAL  Result Value Ref Range   WBC 8.5 4.0 - 10.5 K/uL   RBC 4.46 3.87 - 5.11 MIL/uL   Hemoglobin 13.5 12.0 - 15.0 g/dL   HCT 31.5 40.0 - 86.7 %   MCV 89.7 80.0 - 100.0 fL   MCH 30.3 26.0 - 34.0 pg   MCHC 33.8 30.0 - 36.0 g/dL   RDW 61.9 50.9 - 32.6 %   Platelets 238 150 - 400 K/uL   nRBC 0.0 0.0 - 0.2 %   Neutrophils Relative % 86 %   Neutro Abs 7.4 1.7 - 7.7 K/uL   Lymphocytes Relative 4 %   Lymphs Abs 0.3 (L) 0.7 - 4.0 K/uL   Monocytes Relative 8 %   Monocytes Absolute 0.7 0.1 - 1.0 K/uL   Eosinophils Relative 1 %   Eosinophils Absolute 0.1 0.0 - 0.5 K/uL   Basophils Relative 0 %   Basophils Absolute 0.0 0.0 - 0.1 K/uL   Immature Granulocytes 1 %   Abs Immature Granulocytes 0.04 0.00 - 0.07 K/uL  Urinalysis, Complete w Microscopic  Result Value Ref Range   Color, Urine YELLOW (A) YELLOW   APPearance HAZY (A) CLEAR   Specific Gravity, Urine 1.026 1.005 - 1.030   pH 7.0 5.0 - 8.0   Glucose, UA NEGATIVE NEGATIVE mg/dL   Hgb urine dipstick NEGATIVE NEGATIVE   Bilirubin Urine NEGATIVE NEGATIVE   Ketones, ur NEGATIVE NEGATIVE mg/dL   Protein, ur NEGATIVE NEGATIVE mg/dL   Nitrite NEGATIVE NEGATIVE   Leukocytes,Ua NEGATIVE NEGATIVE   RBC / HPF 0-5 0 - 5 RBC/hpf   WBC, UA 0-5 0 - 5 WBC/hpf   Bacteria, UA NONE SEEN NONE SEEN   Squamous Epithelial / LPF 21-50 0 - 5   Mucus PRESENT   POC urine preg, ED  Result Value Ref Range   Preg Test, Ur NEGATIVE NEGATIVE      Assessment & Plan:   Problem List Items Addressed This Visit   None Visit Diagnoses     COVID-19    -  Primary   Day 6 and did not tolerate Paxlovid. Too late in illness for [place].  Rx for cough meds.  Written instr.  for Vit D and C, Quercetin, Zinc, Vit E.   Cough       Robitussin with  Codeine and inhaler for cough.  Wear mask when in public        Follow up plan: Return if symptoms worsen or fail to improve.

## 2020-07-10 NOTE — Telephone Encounter (Signed)
  Notes to clinic:   Alternative Requested:PROAIR HFA IS ONLY COVERED UNDER INSURANCE, NOT RESPICLICK.  Requested Prescriptions  Pending Prescriptions Disp Refills   PROAIR RESPICLICK 108 (90 Base) MCG/ACT AEPB [Pharmacy Med Name: PROAIR RESPICLICK 90 MCG INHLR] 1 each 1    Sig: INHALE 2 PUFFS INTO THE LUNGS EVERY 4 (FOUR) HOURS AS NEEDED (WHEEZE, SOB, COUGHING FITS).      Pulmonology:  Beta Agonists Failed - 07/10/2020  8:48 AM      Failed - One inhaler should last at least one month. If the patient is requesting refills earlier, contact the patient to check for uncontrolled symptoms.      Passed - Valid encounter within last 12 months    Recent Outpatient Visits           Today COVID-19   [facility] [name], NP   10 months ago Upper respiratory tract infection due to COVID-19 virus   [facility] [name], [place]   11 months ago Upper respiratory tract infection, unspecified type   [facility] [name], PA-C   1 year ago Pneumonia of right lower lobe due to infectious organism   [facility] [name], MD

## 2020-07-24 ENCOUNTER — Other Ambulatory Visit: Payer: Self-pay | Admitting: Unknown Physician Specialty

## 2020-07-24 ENCOUNTER — Other Ambulatory Visit: Payer: Self-pay | Admitting: Family Medicine

## 2020-07-24 MED ORDER — ALBUTEROL SULFATE HFA 108 (90 BASE) MCG/ACT IN AERS: 2.0000 | INHALATION_SPRAY | RESPIRATORY_TRACT | 3 refills | Status: DC | PRN

## 2020-07-24 NOTE — Telephone Encounter (Signed)
   Notes to clinic:  : Alternative Requested:PA IS NEEDED.   Requested Prescriptions  Pending Prescriptions Disp Refills   PROAIR RESPICLICK 108 (90 Base) MCG/ACT AEPB [Pharmacy Med Name: PROAIR RESPICLICK 90 MCG INHLR] 1 each 1    Sig: INHALE 2 PUFFS INTO THE LUNGS EVERY 4 (FOUR) HOURS AS NEEDED (WHEEZE, SOB, COUGHING FITS).      Pulmonology:  Beta Agonists Failed - 07/24/2020  8:12 AM      Failed - One inhaler should last at least one month. If the patient is requesting refills earlier, contact the patient to check for uncontrolled symptoms.      Passed - Valid encounter within last 12 months    Recent Outpatient Visits           2 weeks ago COVID-19   [facility] [name], NP   10 months ago Upper respiratory tract infection due to COVID-19 virus   [facility] [name], PA-C   1 year ago Upper respiratory tract infection, unspecified type   [facility] [name], PA-C   1 year ago Pneumonia of right lower lobe due to infectious organism   [facility] [name], MD       Future Appointments             In 2 days [name], NP [facility], [facility]

## 2020-07-26 ENCOUNTER — Encounter: Payer: Self-pay | Admitting: Unknown Physician Specialty

## 2020-07-26 ENCOUNTER — Ambulatory Visit (INDEPENDENT_AMBULATORY_CARE_PROVIDER_SITE_OTHER): Payer: Medicaid Other | Admitting: Unknown Physician Specialty

## 2020-07-26 ENCOUNTER — Other Ambulatory Visit: Payer: Self-pay

## 2020-07-26 DIAGNOSIS — F419 Anxiety disorder, unspecified: Secondary | ICD-10-CM | POA: Diagnosis not present

## 2020-07-26 DIAGNOSIS — M222X1 Patellofemoral disorders, right knee: Secondary | ICD-10-CM | POA: Diagnosis not present

## 2020-07-26 DIAGNOSIS — M778 Other enthesopathies, not elsewhere classified: Secondary | ICD-10-CM | POA: Diagnosis not present

## 2020-07-26 DIAGNOSIS — M222X2 Patellofemoral disorders, left knee: Secondary | ICD-10-CM | POA: Diagnosis not present

## 2020-07-26 DIAGNOSIS — M222X9 Patellofemoral disorders, unspecified knee: Secondary | ICD-10-CM | POA: Insufficient documentation

## 2020-07-26 MED ORDER — BUSPIRONE HCL 5 MG PO TABS: 5.0000 mg | ORAL_TABLET | Freq: Two times a day (BID) | ORAL | 0 refills | Status: DC

## 2020-07-26 MED ORDER — PROPRANOLOL HCL 10 MG PO TABS: 10.0000 mg | ORAL_TABLET | Freq: Three times a day (TID) | ORAL | 0 refills | Status: DC | PRN

## 2020-07-26 NOTE — Progress Notes (Signed)
BP 122/78   Pulse 95   Temp 98.2 F (36.8 C) (Oral)   Resp 18   Ht 5\' 1"  (1.549 m)   Wt 141 lb 4.8 oz (64.1 kg)   SpO2 98%   BMI 26.70 kg/m    Subjective:    Patient ID: , female    DOB: 07-13-1990, 30 y.o.   MRN: [id number]  HPI: Julia Cherry is a 30 y.o. female  Chief Complaint  Patient presents with   Shoulder Pain    bilateral   Knee Pain    Bilateral knee pain seen at [facility], hard to stand or drive   Referral    Cardiology   Pt is here for ongoing medical conditions which include tendonitis of both shoulders, Knee pain (she states there is a "lot of play") with laxity.  She has seen Orthopedics in the past and waiting for medical to "kick in" for follow up.  Repetitive motion aggravates it.    She has been on a lot of medications for anxiety and has seen [facility].  She has been on Xanax, Trazadone, Abilify, Ativan, Zoloft and one other for anxiety.  States non- of these medications work.  States her anxiety flares with certain triggers such as smells, allergies.    Pt is concerned with one ER visit in which her tropinum levles were elevated.  Extensive review of multiple notes I cannot find this  Relevant past medical, surgical, family and social history reviewed and updated as indicated. Interim medical history since our last visit reviewed. Allergies and medications reviewed and updated.  Review of Systems  Per HPI unless specifically indicated above     Objective:    BP 122/78   Pulse 95   Temp 98.2 F (36.8 C) (Oral)   Resp 18   Ht 5\' 1"  (1.549 m)   Wt 141 lb 4.8 oz (64.1 kg)   SpO2 98%   BMI 26.70 kg/m   Wt Readings from Last 3 Encounters:  07/26/20 141 lb 4.8 oz (64.1 kg)  07/05/20 135 lb (61.2 kg)  05/21/20 140 lb (63.5 kg)    Physical Exam Constitutional:      General: She is not in acute distress.    Appearance: Normal appearance. She is well-developed.  HENT:     Head: Normocephalic  and atraumatic.  Eyes:     General: Lids are normal. No scleral icterus.       Right eye: No discharge.        Left eye: No discharge.     Conjunctiva/sclera: Conjunctivae normal.  Cardiovascular:     Rate and Rhythm: Normal rate.  Pulmonary:     Effort: Pulmonary effort is normal.  Abdominal:     Palpations: There is no hepatomegaly or splenomegaly.  Musculoskeletal:        General: Normal range of motion.  Skin:    Coloration: Skin is not pale.     Findings: No rash.  Neurological:     Mental Status: She is alert and oriented to person, place, and time.  Psychiatric:        Behavior: Behavior normal.        Thought Content: Thought content normal.        Judgment: Judgment normal.    Results for orders placed or performed during the hospital encounter of 07/05/20  Resp Panel by RT-PCR (Flu A&B, Covid) Nasopharyngeal Swab   Specimen: Nasopharyngeal Swab; Nasopharyngeal(NP) swabs in vial transport  medium  Result Value Ref Range   SARS Coronavirus 2 by RT PCR POSITIVE (A) NEGATIVE   Influenza A by PCR NEGATIVE NEGATIVE   Influenza B by PCR NEGATIVE NEGATIVE  Comprehensive metabolic panel  Result Value Ref Range   Sodium 137 135 - 145 mmol/L   Potassium 3.6 3.5 - 5.1 mmol/L   Chloride 102 98 - 111 mmol/L   CO2 29 22 - 32 mmol/L   Glucose, Bld 95 70 - 99 mg/dL   BUN 15 6 - 20 mg/dL   Creatinine, Ser 3.61 0.44 - 1.00 mg/dL   Calcium 9.0 8.9 - 44.3 mg/dL   Total Protein 7.4 6.5 - 8.1 g/dL   Albumin 3.9 3.5 - 5.0 g/dL   AST 16 15 - 41 U/L   ALT 16 0 - 44 U/L   Alkaline Phosphatase 37 (L) 38 - 126 U/L   Total Bilirubin 0.6 0.3 - 1.2 mg/dL   GFR, Estimated >15 >40 mL/min   Anion gap 6 5 - 15  CBC WITH DIFFERENTIAL  Result Value Ref Range   WBC 8.5 4.0 - 10.5 K/uL   RBC 4.46 3.87 - 5.11 MIL/uL   Hemoglobin 13.5 12.0 - 15.0 g/dL   HCT 08.6 76.1 - 95.0 %   MCV 89.7 80.0 - 100.0 fL   MCH 30.3 26.0 - 34.0 pg   MCHC 33.8 30.0 - 36.0 g/dL   RDW 93.2 67.1 - 24.5 %    Platelets 238 150 - 400 K/uL   nRBC 0.0 0.0 - 0.2 %   Neutrophils Relative % 86 %   Neutro Abs 7.4 1.7 - 7.7 K/uL   Lymphocytes Relative 4 %   Lymphs Abs 0.3 (L) 0.7 - 4.0 K/uL   Monocytes Relative 8 %   Monocytes Absolute 0.7 0.1 - 1.0 K/uL   Eosinophils Relative 1 %   Eosinophils Absolute 0.1 0.0 - 0.5 K/uL   Basophils Relative 0 %   Basophils Absolute 0.0 0.0 - 0.1 K/uL   Immature Granulocytes 1 %   Abs Immature Granulocytes 0.04 0.00 - 0.07 K/uL  Urinalysis, Complete w Microscopic  Result Value Ref Range   Color, Urine YELLOW (A) YELLOW   APPearance HAZY (A) CLEAR   Specific Gravity, Urine 1.026 1.005 - 1.030   pH 7.0 5.0 - 8.0   Glucose, UA NEGATIVE NEGATIVE mg/dL   Hgb urine dipstick NEGATIVE NEGATIVE   Bilirubin Urine NEGATIVE NEGATIVE   Ketones, ur NEGATIVE NEGATIVE mg/dL   Protein, ur NEGATIVE NEGATIVE mg/dL   Nitrite NEGATIVE NEGATIVE   Leukocytes,Ua NEGATIVE NEGATIVE   RBC / HPF 0-5 0 - 5 RBC/hpf   WBC, UA 0-5 0 - 5 WBC/hpf   Bacteria, UA NONE SEEN NONE SEEN   Squamous Epithelial / LPF 21-50 0 - 5   Mucus PRESENT   POC urine preg, ED  Result Value Ref Range   Preg Test, Ur NEGATIVE NEGATIVE      Assessment & Plan:   Problem List Items Addressed This Visit       Unprioritized   Anxiety    Complicated problem and has been on multiple medications.  Will refer to psychiatry.  I will rx Buspar 5 mg TID.  Will rx propranalol to take prn panic       Relevant Medications   busPIRone (BUSPAR) 5 MG tablet   Other Relevant Orders   Ambulatory referral to Psychiatry   AMB Referral to [facility] Care Coordinaton   Patella-femoral syndrome    Refer  to orthopedics as surgery has been discussed       Relevant Orders   AMB referral to orthopedics   Shoulder tendonitis    Evaluated back to orthopedics where this was evaluated in the past.  Suspect will need PT.  Recommended to decrease repetitive motion at work       Relevant Orders   AMB referral to  orthopedics    > 40 minutes spent with this visit  Follow up plan: Return in about 4 weeks (around 08/23/2020) for anxiety.

## 2020-07-26 NOTE — Assessment & Plan Note (Signed)
Evaluated back to orthopedics where this was evaluated in the past.  Suspect will need PT.  Recommended to decrease repetitive motion at work

## 2020-07-26 NOTE — Assessment & Plan Note (Signed)
Complicated problem and has been on multiple medications.  Will refer to psychiatry.  I will rx Buspar 5 mg TID.  Will rx propranalol to take prn panic

## 2020-07-26 NOTE — Assessment & Plan Note (Signed)
Refer to orthopedics as surgery has been discussed

## 2020-08-02 ENCOUNTER — Other Ambulatory Visit: Payer: Self-pay

## 2020-08-02 NOTE — Patient Outreach (Signed)
  [facility]) Care Management  [facility] Care Manager  08/02/2020   Julia Cherry 1990-03-24 [id number]   Encounter Medications:  Outpatient Encounter Medications as of 08/02/2020  Medication Sig   acetaminophen (TYLENOL) 500 MG tablet Take 500 mg by mouth every 6 (six) hours as needed.   albuterol (VENTOLIN HFA) 108 (90 Base) MCG/ACT inhaler Inhale 2 puffs into the lungs every 4 (four) hours as needed for wheezing or shortness of breath.   ALPRAZolam (XANAX) 0.25 MG tablet Take 1 tablet (0.25 mg total) by mouth every 8 (eight) hours as needed for anxiety. (Patient not taking: Reported on 07/26/2020)   busPIRone (BUSPAR) 5 MG tablet Take 1 tablet (5 mg total) by mouth 2 (two) times daily.   fluticasone (FLOVENT HFA) 110 MCG/ACT inhaler Inhale 2 puffs into the lungs 2 (two) times daily. Rinse out mouth and spit after each use   guaiFENesin-codeine 100-10 MG/5ML syrup Take 10 mLs by mouth 3 (three) times daily as needed for cough.   ipratropium-albuterol (DUONEB) 0.5-2.5 (3) MG/3ML SOLN Take 3 mLs by nebulization every 6 (six) hours as needed (asthma/wheeze/coughing fits).   ondansetron (ZOFRAN ODT) 4 MG disintegrating tablet Allow 1-2 tablets to dissolve in your mouth every 8 hours as needed for nausea/vomiting (Patient not taking: Reported on 07/26/2020)   propranolol (INDERAL) 10 MG tablet Take 1 tablet (10 mg total) by mouth 3 (three) times daily as needed.   [DISCONTINUED] beclomethasone (QVAR) 80 MCG/ACT inhaler Inhale 2 puffs into the lungs 2 (two) times daily.   No facility-administered encounter medications on file as of 08/02/2020.    Functional Status:  In your present state of health, do you have any difficulty performing the following activities: 07/26/2020 07/10/2020  Hearing? N N  Vision? N N  Difficulty concentrating or making decisions? N N  Walking or climbing stairs? N N  Dressing or bathing? N N  Doing errands, shopping? N N  Some recent data might  be hidden    Fall/Depression Screening: Fall Risk  07/26/2020 07/10/2020 09/08/2019  Falls in the past year? 0 0 1  Number falls in past yr: 0 0 0  Injury with Fall? 0 0 0  Follow up Falls evaluation completed Falls evaluation completed -   PHQ 2/9 Scores 07/26/2020 07/10/2020 09/08/2019 07/25/2019 03/16/2019  PHQ - 2 Score 4 0 0 0 0  PHQ- 9 Score 4 - - 0 0    LCSW completed [facility] outreach attempt today but was unable to reach patient successfully. A HIPPA compliant voice message was left encouraging patient to return call once available. LCSW will reschedule patient's [facility] Social Work appointment if no return call has been made.  Dickie La, BSW, MSW, [employer] Managed Medicaid LCSW [facility]  [facility] [email or web address] Phone: [phone]   Plan:  Follow-up: Follow-up in 1 day(s)

## 2020-08-02 NOTE — Patient Instructions (Signed)
Julia Cherry ,   The [facility] Managed Care Team is available to provide assistance to you with your healthcare needs at no cost and as a benefit of your [facility] Health plan. Please reach out to me at the number below. I am available to be of assistance to you regarding your healthcare needs. .   Thank you,   Dickie La, BSW, MSW, LCSW Managed Medicaid LCSW [facility]  [address] [email or web address] Phone: [phone]

## 2020-08-03 ENCOUNTER — Ambulatory Visit: Payer: Self-pay

## 2020-08-06 ENCOUNTER — Ambulatory Visit: Payer: Self-pay

## 2020-08-08 ENCOUNTER — Ambulatory Visit: Payer: Self-pay

## 2020-08-15 ENCOUNTER — Telehealth: Payer: Self-pay | Admitting: Licensed Clinical Social Worker

## 2020-08-15 ENCOUNTER — Ambulatory Visit: Payer: Self-pay

## 2020-08-15 NOTE — Patient Instructions (Signed)
Visit Information  Ms. Rafferty was given information about Medicaid Managed Care team care coordination services as a part of their [employer] Medicaid benefit. [name] verbally consentedto engagement with the [facility] Managed Care team.   If you are experiencing a medical emergency, please call 911 or report to your local emergency department or urgent care.   If you have a non-emergency medical problem during routine business hours, please contact your provider's office and ask to speak with a nurse.   For questions related to your [employer] [facility] health plan, please call: [phone]  OR visit the member homepage at: [email or web address]  If you would like to schedule transportation through your [facility] plan, please call the following number at least 2 days in advance of your appointment: [phone]  If you are experiencing a behavioral health crisis, call the [facility] [facility] Crisis Line at [phone]). The line is available 24 hours a day, seven days a week.  If you would like help to quit smoking, call 1-800-QUIT-NOW (743-418-0007) OR Espaol: 1-855-Djelo-Ya (1-245-809-9833) o para ms informacin haga clic aqu or Text READY to 825-053 to register via text  Ms. Merlinda Frederick - following are the goals we discussed in your visit today:   Goals Addressed   None   Dickie La, BSW, MSW, [employer] Managed Medicaid LCSW [facility]  [facility] [email or web address] Phone: [phone]

## 2020-08-15 NOTE — Patient Outreach (Signed)
  [facility]) Care Management  [facility] Social Work  08/15/2020  Julia Cherry 08/27/1990 [id number]  Encounter Medications:  Outpatient Encounter Medications as of 08/15/2020  Medication Sig   acetaminophen (TYLENOL) 500 MG tablet Take 500 mg by mouth every 6 (six) hours as needed.   albuterol (VENTOLIN HFA) 108 (90 Base) MCG/ACT inhaler Inhale 2 puffs into the lungs every 4 (four) hours as needed for wheezing or shortness of breath.   ALPRAZolam (XANAX) 0.25 MG tablet Take 1 tablet (0.25 mg total) by mouth every 8 (eight) hours as needed for anxiety. (Patient not taking: Reported on 07/26/2020)   busPIRone (BUSPAR) 5 MG tablet Take 1 tablet (5 mg total) by mouth 2 (two) times daily.   fluticasone (FLOVENT HFA) 110 MCG/ACT inhaler Inhale 2 puffs into the lungs 2 (two) times daily. Rinse out mouth and spit after each use   guaiFENesin-codeine 100-10 MG/5ML syrup Take 10 mLs by mouth 3 (three) times daily as needed for cough.   ipratropium-albuterol (DUONEB) 0.5-2.5 (3) MG/3ML SOLN Take 3 mLs by nebulization every 6 (six) hours as needed (asthma/wheeze/coughing fits).   ondansetron (ZOFRAN ODT) 4 MG disintegrating tablet Allow 1-2 tablets to dissolve in your mouth every 8 hours as needed for nausea/vomiting (Patient not taking: Reported on 07/26/2020)   propranolol (INDERAL) 10 MG tablet Take 1 tablet (10 mg total) by mouth 3 (three) times daily as needed.   [DISCONTINUED] beclomethasone (QVAR) 80 MCG/ACT inhaler Inhale 2 puffs into the lungs 2 (two) times daily.   No facility-administered encounter medications on file as of 08/15/2020.    Functional Status:  In your present state of health, do you have any difficulty performing the following activities: 07/26/2020 07/10/2020  Hearing? N N  Vision? N N  Difficulty concentrating or making decisions? N N  Walking or climbing stairs? N N  Dressing or bathing? N N  Doing errands, shopping? N N  Some recent data might be  hidden    Fall/Depression Screening:  PHQ 2/9 Scores 07/26/2020 07/10/2020 09/08/2019 07/25/2019 03/16/2019  PHQ - 2 Score 4 0 0 0 0  PHQ- 9 Score 4 - - 0 0    Assessment:  Care Plan There are no care plans that you recently modified to display for this patient.    Goals Addressed   None    [facility] LCSW discussed with patient today available mental health resources. Patient reports that she could not answer my call because she is too overwhelmed from her anxiety and depression and wants mental health referrals placed today. Patient provided verbal permission for [facility] LCSW to placed a referral to the [employer] platform for both counseling and medication management on 08/15/20. [facility] Supervisor updated as well. [facility] LCSW rescheduled social work appointment as well.  Dickie La, BSW, MSW, [employer] Managed Medicaid LCSW [facility]  [facility] [email or web address] Phone: [phone]

## 2020-08-23 ENCOUNTER — Other Ambulatory Visit: Payer: Self-pay

## 2020-08-23 ENCOUNTER — Encounter: Payer: Self-pay | Admitting: Family Medicine

## 2020-08-23 ENCOUNTER — Ambulatory Visit: Payer: Medicaid Other | Admitting: Family Medicine

## 2020-08-23 VITALS — BP 110/62 | HR 86 | Temp 98.1°F | Resp 16 | Ht 61.0 in | Wt 143.6 lb

## 2020-08-23 DIAGNOSIS — J4541 Moderate persistent asthma with (acute) exacerbation: Secondary | ICD-10-CM

## 2020-08-23 DIAGNOSIS — F419 Anxiety disorder, unspecified: Secondary | ICD-10-CM | POA: Diagnosis not present

## 2020-08-23 DIAGNOSIS — J302 Other seasonal allergic rhinitis: Secondary | ICD-10-CM

## 2020-08-23 MED ORDER — BUSPIRONE HCL 5 MG PO TABS: 5.0000 mg | ORAL_TABLET | Freq: Two times a day (BID) | ORAL | 3 refills | Status: DC

## 2020-08-23 MED ORDER — MONTELUKAST SODIUM 10 MG PO TABS: 10.0000 mg | ORAL_TABLET | Freq: Every day | ORAL | 3 refills | Status: DC

## 2020-08-23 MED ORDER — IPRATROPIUM-ALBUTEROL 0.5-2.5 (3) MG/3ML IN SOLN: 3.0000 mL | Freq: Four times a day (QID) | RESPIRATORY_TRACT | 1 refills | Status: DC | PRN

## 2020-08-23 MED ORDER — PROPRANOLOL HCL 10 MG PO TABS: 10.0000 mg | ORAL_TABLET | Freq: Three times a day (TID) | ORAL | 0 refills | Status: DC | PRN

## 2020-08-23 MED ORDER — SYMBICORT 160-4.5 MCG/ACT IN AERO: 2.0000 | INHALATION_SPRAY | Freq: Two times a day (BID) | RESPIRATORY_TRACT | 12 refills | Status: DC

## 2020-08-23 NOTE — Progress Notes (Signed)
Patient ID: Julia Cherry, female    DOB: 11-21-1990, 30 y.o.   MRN: [id number]  PCP: [name], PA-C  Chief Complaint  Patient presents with   Anxiety    Subjective:   Julia Cherry is a 30 y.o. female, presents to clinic with CC of the following:  HPI   Here for f/up on anxiety: She states she did not get any of the medications prescribed to her at last office visit which was BuSpar and propranolol She does have a psychiatry initial evaluation set up this next week Last seen a month ago by Elnita Maxwell NP for multiple CC/Sx: She has been on a lot of medications for anxiety and has seen [facility].  She has been on Xanax, Trazadone, Abilify, Ativan, Zoloft and one other for anxiety.  States non- of these medications work.  States her anxiety flares with certain triggers such as smells, allergies.   Anxiety       Complicated problem and has been on multiple medications.  Will refer to psychiatry.  I will rx Buspar 5 mg TID.  Will rx propranalol to take prn panic          Relevant Medications    busPIRone (BUSPAR) 5 MG tablet    Other Relevant Orders    Ambulatory referral to Psychiatry    AMB Referral to [facility] Coordinaton    Depression screen [facility] 2/9 08/23/2020 07/26/2020 07/10/2020 09/08/2019 07/25/2019  Decreased Interest 2 2 0 0 0  Down, Depressed, Hopeless 2 2 0 0 0  PHQ - 2 Score 4 4 0 0 0  Altered sleeping 3 0 - - 0  Tired, decreased energy 3 0 - - 0  Change in appetite 2 0 - - 0  Feeling bad or failure about yourself  2 0 - - 0  Trouble concentrating 0 0 - - 0  Moving slowly or fidgety/restless 1 0 - - 0  Suicidal thoughts 0 0 - - 0  PHQ-9 Score 15 4 - - 0  Difficult doing work/chores Somewhat difficult Not difficult at all - - Not difficult at all   GAD 7 : Generalized Anxiety Score 08/23/2020 07/26/2020  Nervous, Anxious, on Edge 3 0  Control/stop worrying 2 0  Worry too much - different things 2 0  Trouble relaxing 2 0   Restless 1 0  Easily annoyed or irritable 3 0  Afraid - awful might happen 1 0  Total GAD 7 Score 14 0  Anxiety Difficulty Somewhat difficult Not difficult at all   PHQ and GAD 7 reviewed - scores positive and worse than last OV  Worsening asthma sx with allergies She has Qvar and Flovent on her chart and she is using duo nebs and rescue inhaler, having coughing fits every evening, used to be on Singulair but has not been for a few years no other allergy medications, she previously did well with Singulair Her mother has similar asthma and allergies and has required allergy shots with progressive symptoms over the years.  Patient asked for a referral.   08/23/2020   1138  Asthma History   Symptoms Daily  Nighttime Awakenings >1/wk but not nightly  Asthma interference with normal activity Minor limitations  SABA use (not for EIB) > 2 days/wk--not > 1 x/day  Risk: Exacerbations requiring oral systemic steroids 2 or more / year  Asthma Severity Moderate Persistent     Patient Active Problem List   Diagnosis Date Noted  Shoulder tendonitis 07/26/2020   Anxiety 07/26/2020   Patella-femoral syndrome 07/26/2020   Postpartum care following vaginal delivery 06/24/2015   Back pain 05/31/2015   Back pain affecting pregnancy, antepartum 03/30/2015   Bilateral lower abdominal pain 02/27/2015   Abdominal pain affecting pregnancy, antepartum 02/09/2015      Current Outpatient Medications:    albuterol (VENTOLIN HFA) 108 (90 Base) MCG/ACT inhaler, Inhale 2 puffs into the lungs every 4 (four) hours as needed for wheezing or shortness of breath., Disp: 1 each, Rfl: 3   busPIRone (BUSPAR) 5 MG tablet, Take 1 tablet (5 mg total) by mouth 2 (two) times daily., Disp: 30 tablet, Rfl: 0   fluticasone (FLOVENT HFA) 110 MCG/ACT inhaler, Inhale 2 puffs into the lungs 2 (two) times daily. Rinse out mouth and spit after each use, Disp: 1 Inhaler, Rfl: 2   ipratropium-albuterol (DUONEB) 0.5-2.5 (3)  MG/3ML SOLN, Take 3 mLs by nebulization every 6 (six) hours as needed (asthma/wheeze/coughing fits)., Disp: 360 mL, Rfl: 1   propranolol (INDERAL) 10 MG tablet, Take 1 tablet (10 mg total) by mouth 3 (three) times daily as needed., Disp: 30 tablet, Rfl: 0   Allergies  Allergen Reactions   Bee Venom Anaphylaxis   Strawberry (Diagnostic) Anaphylaxis     Social History   Tobacco Use   Smoking status: Never   Smokeless tobacco: Never  Vaping Use   Vaping Use: Never used  Substance Use Topics   Alcohol use: No   Drug use: No      Chart Review Today: I personally reviewed active problem list, medication list, allergies, family history, social history, health maintenance, notes from last encounter, lab results, imaging with the patient/caregiver today.   Review of Systems  Constitutional: Negative.   HENT: Negative.    Eyes: Negative.   Respiratory: Negative.    Cardiovascular: Negative.   Gastrointestinal: Negative.   Endocrine: Negative.   Genitourinary: Negative.   Musculoskeletal: Negative.   Skin: Negative.   Allergic/Immunologic: Negative.   Neurological: Negative.   Hematological: Negative.   Psychiatric/Behavioral: Negative.    All other systems reviewed and are negative.     Objective:   Vitals:   08/23/20 1107  BP: 110/62  Pulse: 86  Resp: 16  Temp: 98.1 F (36.7 C)  SpO2: 99%  Weight: 143 lb 9.6 oz (65.1 kg)  Height: 5\' 1"  (1.549 m)    Body mass index is 27.13 kg/m.  Physical Exam Vitals and nursing note reviewed.  Constitutional:      General: She is not in acute distress.    Appearance: Normal appearance. She is well-developed. She is not ill-appearing, toxic-appearing or diaphoretic.     Interventions: Face mask in place.  HENT:     Head: Normocephalic and atraumatic.     Right Ear: External ear normal.     Left Ear: External ear normal.  Eyes:     General: Lids are normal. No scleral icterus.       Right eye: No discharge.        Left  eye: No discharge.     Conjunctiva/sclera: Conjunctivae normal.  Neck:     Trachea: Phonation normal. No tracheal deviation.  Cardiovascular:     Rate and Rhythm: Normal rate and regular rhythm.     Pulses: Normal pulses.          Radial pulses are 2+ on the right side and 2+ on the left side.       Posterior tibial pulses are 2+  on the right side and 2+ on the left side.     Heart sounds: Normal heart sounds. No murmur heard.   No friction rub. No gallop.  Pulmonary:     Effort: Pulmonary effort is normal. No respiratory distress.     Breath sounds: Normal breath sounds. No stridor. No wheezing, rhonchi or rales.  Chest:     Chest wall: No tenderness.  Abdominal:     General: Bowel sounds are normal. There is no distension.     Palpations: Abdomen is soft.  Musculoskeletal:     Right lower leg: No edema.     Left lower leg: No edema.  Skin:    General: Skin is warm and dry.     Coloration: Skin is not jaundiced or pale.     Findings: No rash.  Neurological:     Mental Status: She is alert.     Motor: No abnormal muscle tone.     Gait: Gait normal.  Psychiatric:        Mood and Affect: Mood normal.        Speech: Speech normal.        Behavior: Behavior normal.     Results for orders placed or performed during the hospital encounter of 07/05/20  Resp Panel by RT-PCR (Flu A&B, Covid) Nasopharyngeal Swab   Specimen: Nasopharyngeal Swab; Nasopharyngeal(NP) swabs in vial transport medium  Result Value Ref Range   SARS Coronavirus 2 by RT PCR POSITIVE (A) NEGATIVE   Influenza A by PCR NEGATIVE NEGATIVE   Influenza B by PCR NEGATIVE NEGATIVE  Comprehensive metabolic panel  Result Value Ref Range   Sodium 137 135 - 145 mmol/L   Potassium 3.6 3.5 - 5.1 mmol/L   Chloride 102 98 - 111 mmol/L   CO2 29 22 - 32 mmol/L   Glucose, Bld 95 70 - 99 mg/dL   BUN 15 6 - 20 mg/dL   Creatinine, Ser 8.78 0.44 - 1.00 mg/dL   Calcium 9.0 8.9 - 67.6 mg/dL   Total Protein 7.4 6.5 - 8.1 g/dL    Albumin 3.9 3.5 - 5.0 g/dL   AST 16 15 - 41 U/L   ALT 16 0 - 44 U/L   Alkaline Phosphatase 37 (L) 38 - 126 U/L   Total Bilirubin 0.6 0.3 - 1.2 mg/dL   GFR, Estimated >72 >09 mL/min   Anion gap 6 5 - 15  CBC WITH DIFFERENTIAL  Result Value Ref Range   WBC 8.5 4.0 - 10.5 K/uL   RBC 4.46 3.87 - 5.11 MIL/uL   Hemoglobin 13.5 12.0 - 15.0 g/dL   HCT 47.0 96.2 - 83.6 %   MCV 89.7 80.0 - 100.0 fL   MCH 30.3 26.0 - 34.0 pg   MCHC 33.8 30.0 - 36.0 g/dL   RDW 62.9 47.6 - 54.6 %   Platelets 238 150 - 400 K/uL   nRBC 0.0 0.0 - 0.2 %   Neutrophils Relative % 86 %   Neutro Abs 7.4 1.7 - 7.7 K/uL   Lymphocytes Relative 4 %   Lymphs Abs 0.3 (L) 0.7 - 4.0 K/uL   Monocytes Relative 8 %   Monocytes Absolute 0.7 0.1 - 1.0 K/uL   Eosinophils Relative 1 %   Eosinophils Absolute 0.1 0.0 - 0.5 K/uL   Basophils Relative 0 %   Basophils Absolute 0.0 0.0 - 0.1 K/uL   Immature Granulocytes 1 %   Abs Immature Granulocytes 0.04 0.00 - 0.07 K/uL  Urinalysis, Complete w  Microscopic  Result Value Ref Range   Color, Urine YELLOW (A) YELLOW   APPearance HAZY (A) CLEAR   Specific Gravity, Urine 1.026 1.005 - 1.030   pH 7.0 5.0 - 8.0   Glucose, UA NEGATIVE NEGATIVE mg/dL   Hgb urine dipstick NEGATIVE NEGATIVE   Bilirubin Urine NEGATIVE NEGATIVE   Ketones, ur NEGATIVE NEGATIVE mg/dL   Protein, ur NEGATIVE NEGATIVE mg/dL   Nitrite NEGATIVE NEGATIVE   Leukocytes,Ua NEGATIVE NEGATIVE   RBC / HPF 0-5 0 - 5 RBC/hpf   WBC, UA 0-5 0 - 5 WBC/hpf   Bacteria, UA NONE SEEN NONE SEEN   Squamous Epithelial / LPF 21-50 0 - 5   Mucus PRESENT   POC urine preg, ED  Result Value Ref Range   Preg Test, Ur NEGATIVE NEGATIVE       Assessment & Plan:     ICD-10-CM   1. Anxiety  F41.9 busPIRone (BUSPAR) 5 MG tablet    propranolol (INDERAL) 10 MG tablet   Continue with psychiatry appointment next week, trial of BuSpar and propranolol and close follow-up    2. Moderate persistent asthma with exacerbation  J45.41  montelukast (SINGULAIR) 10 MG tablet    Ambulatory referral to Allergy    SYMBICORT 160-4.5 MCG/ACT inhaler   Seems to be under controlled and very symptomatic stop inhaled corticosteroid and start combo inhaler    3. Seasonal allergies  J30.2 montelukast (SINGULAIR) 10 MG tablet    Ambulatory referral to Allergy   Encouraged to use over-the-counter second-generation antihistamines and restart Singulair          [name], PA-C 08/23/20 11:28 AM

## 2020-08-24 ENCOUNTER — Other Ambulatory Visit: Payer: Self-pay

## 2020-08-24 NOTE — Patient Outreach (Signed)
Medicaid Managed Care Social Work Note  08/24/2020 Name:  Julia Cherry MRN:  [id number] DOB:  [birth year]  Julia Cherry is an 30 y.o. year old female who is a primary patient of [name], [place].  The Medicaid Managed Care Coordination team was consulted for assistance with:  Mental Health Counseling and Resources  [name] was given information about Medicaid Managed Care Coordination team services today. [name] Patient agreed to services and verbal consent obtained.  Engaged with patient  for by telephone forfollow up visit in response to referral for case management and/or care coordination services.   Assessments/Interventions:  Review of past medical history, allergies, medications, health status, including review of consultants reports, laboratory and other test data, was performed as part of comprehensive evaluation and provision of chronic care management services.  SDOH: (Social Determinant of Health) assessments and interventions performed: SDOH Interventions    Flowsheet Row Most Recent Value  SDOH Interventions   SDOH Interventions for the Following Domains Stress  Stress Interventions Provide Counseling  Depression Interventions/Treatment  Referral to Psychiatry       Advanced Directives Status:  See Care Plan for related entries.  Care Plan                 Allergies  Allergen Reactions   Bee Venom Anaphylaxis   Strawberry (Diagnostic) Anaphylaxis    Medications Reviewed Today     Reviewed by Marcos Eke, CMA (Certified Medical Assistant) on 08/23/20 at 1108  Med List Status: <None>   Medication Order Taking? Sig Documenting Provider Last Dose Status Informant  acetaminophen (TYLENOL) 500 MG tablet [id number] Yes Take 500 mg by mouth every 6 (six) hours as needed. [provider] Taking Active   albuterol (VENTOLIN HFA) 108 (90 Base) MCG/ACT inhaler [id number] Yes Inhale 2 puffs into the lungs every 4 (four)  hours as needed for wheezing or shortness of breath. [name], PA-C Taking Active   ALPRAZolam [name]) 0.25 MG tablet [id number] Yes Take 1 tablet (0.25 mg total) by mouth every 8 (eight) hours as needed for anxiety. [name], MD Taking Active     Discontinued 03/13/19 1831 busPIRone (BUSPAR) 5 MG tablet [id number] Yes Take 1 tablet (5 mg total) by mouth 2 (two) times daily. [name], NP Taking Active   fluticasone [facility] HFA) 110 MCG/ACT inhaler [id number] Yes Inhale 2 puffs into the lungs 2 (two) times daily. Rinse out mouth and spit after each use [name], MD Taking Active   guaiFENesin-codeine 100-10 MG/5ML syrup [id number] Yes Take 10 mLs by mouth 3 (three) times daily as needed for cough. [name], NP Taking Active   ipratropium-albuterol (DUONEB) 0.5-2.5 (3) MG/3ML SOLN [id number] Yes Take 3 mLs by nebulization every 6 (six) hours as needed (asthma/wheeze/coughing fits). [name], PA-C Taking Active   ondansetron (ZOFRAN ODT) 4 MG disintegrating tablet [id number] Yes Allow 1-2 tablets to dissolve in your mouth every 8 hours as needed for nausea/vomiting [name], MD Taking Active   propranolol (INDERAL) 10 MG tablet [id number] Yes Take 1 tablet (10 mg total) by mouth 3 (three) times daily as needed. [name], NP Taking Active             Patient Active Problem List   Diagnosis Date Noted   Shoulder tendonitis 07/26/2020   Anxiety 07/26/2020   Patella-femoral syndrome 07/26/2020   Postpartum care following vaginal delivery 06/24/2015   Back pain 05/31/2015   Back pain affecting pregnancy, antepartum 03/30/2015   Bilateral  lower abdominal pain 02/27/2015   Abdominal pain affecting pregnancy, antepartum 02/09/2015    Conditions to be addressed/monitored per PCP order:  Anxiety and Depression  Care Plan : LCSW PLAN OF CARE  Updates made by [name], LCSW since 08/24/2020 12:00 AM     Problem: Coping Skills (General Plan of  Care)      Long-Range Goal: Coping Skills Enhanced   Start Date: 08/24/2020  This Visit's Progress: On track  Note:   Timeframe:  Long-Range Goal Priority:  High Start Date:    08/24/20                         Expected End Date:   ongoing                   Follow Up Date 09/12/20   Current barriers:   Anxiety and Depression Mental Health Concerns  and Social Isolation Needs Support, Education, and Care Coordination in order to meet unmet mental health needs. Clinical Goal(s): demonstrate a reduction in symptoms related to :Anxiety  and Depression  explore community resource options for unmet needs related HY:WVPX: Social Isolation  patient will work with SW to address concerns related to finding a Veterinary surgeon and psychiatrist for virtual sessions to decrease anxiety and depression and increase coping skills    Clinical Interventions:  Referral made to [facility] on 08/15/20 for virtual counseling and medication management. Patient missed their call and her initial scheduled appointment. Patient is agreeable to contact [facility]'s customer service line today to reschedule.  Inter-disciplinary care team collaboration (see longitudinal plan of care) Assessed patient's previous and current treatment, coping skills, support system and barriers to care  Review various resources, discussed options and provided patient information about Options for mental health treatment based on need and insurance Depression screen reviewed , PHQ2/ PHQ9 completed, Solution-Focused Strategies, Mindfulness or Relaxation Training, Active listening / Reflection utilized , Emotional Supportive Provided, Provided psychoeducation for mental health needs , Motivational Interviewing, Provided brief CBT , Verbalization of feelings encouraged , Suicidal Ideation/Homicidal Ideation assessed:, Discussed Health Care Power of Attorney , and Discussed referral to [facility] to assist with connecting to mental health provider ; Patient  Goals/Self-Care Activities: Over the next 120 days - self-awareness of emotional triggers encouraged - strategies to manage emotional triggers promoted - avoid negative self-talk - develop a personal safety plan - develop a plan to deal with triggers like holidays, anniversaries - exercise at least 2 to 3 times per week - have a plan for how to handle bad days - journal feelings and what helps to feel better or worse - spend time or talk with others at least 2 to 3 times per week - spend time or talk with others every day - watch for early signs of feeling worse - write in journal every day - begin personal counseling - call and visit an old friend - check out volunteer opportunities - join a support group - learn and use visualization or guided imagery - perform a random act of kindness - practice relaxation or meditation daily - talk about feelings with a friend, family or spiritual advisor - practice positive thinking and self-talk I have placed a referral with [facility] to assist with connecting you with a mental health provider. they will contact you once a provider is located.  Depression screen [facility] 2/9 08/24/2020 08/23/2020 07/26/2020 07/10/2020 09/08/2019  Decreased Interest 3 2 2  0 0  Down, Depressed, Hopeless 2  2 2 0 0  PHQ - 2 Score 5 4 4  0 0  Altered sleeping 3 3 0 - -  Tired, decreased energy 3 3 0 - -  Change in appetite 2 2 0 - -  Feeling bad or failure about yourself  2 2 0 - -  Trouble concentrating 0 0 0 - -  Moving slowly or fidgety/restless 1 1 0 - -  Suicidal thoughts 0 0 0 - -  PHQ-9 Score 16 15 4  - -  Difficult doing work/chores Somewhat difficult Somewhat difficult Not difficult at all - -       Follow up:  Patient agrees to Care Plan and Follow-up.  Plan: The Managed Medicaid care management team will reach out to the patient again over the next 30 days.  Date/time of next scheduled Social Work care management/care coordination outreach:   09/12/20  , BSW, MSW, LCSW Managed Medicaid LCSW [facility]  [facility] [email or web address] Phone: [phone]

## 2020-08-24 NOTE — Patient Instructions (Signed)
Visit Information  [name] was given information about Medicaid Managed Care team care coordination services as a part of their [employer] Medicaid benefit. Julia Cherry verbally consentedto engagement with the [facility] Managed Care team.   If you are experiencing a medical emergency, please call 911 or report to your local emergency department or urgent care.   If you have a non-emergency medical problem during routine business hours, please contact your provider's office and ask to speak with a nurse.   For questions related to your [employer] [facility] health plan, please call: [phone]  OR visit the member homepage at: [email or web address]  If you would like to schedule transportation through your [facility] plan, please call the following number at least 2 days in advance of your appointment: [phone]  If you are experiencing a behavioral health crisis, call the [employer] [facility] Crisis Line at [phone]). The line is available 24 hours a day, seven days a week.  If you would like help to quit smoking, call 1-800-QUIT-NOW (623-748-9314) OR Espaol: 1-855-Djelo-Ya (2-355-732-2025) o para ms informacin haga clic aqu or Text READY to 427-062 to register via text  [name] - following are the goals we discussed in your visit today:   Goals Addressed             This Visit's Progress    Begin and Stick with Counseling-Depression       Timeframe:  Long-Range Goal Priority:  High Start Date:    08/24/20                         Expected End Date:   ongoing                   Follow Up Date 09/12/20    - check out counseling - keep 90 percent of counseling appointments - schedule counseling appointment    Why is this important?   Beating depression may take some time.  If you don't feel better right away, don't give  up on your treatment plan.    Notes:        Dickie La, BSW, MSW, [employer] Managed Medicaid LCSW [facility]  [facility] [email or web address] Phone: [phone]

## 2020-09-12 ENCOUNTER — Ambulatory Visit: Payer: Self-pay

## 2020-09-12 ENCOUNTER — Telehealth: Payer: Self-pay | Admitting: Licensed Clinical Social Worker

## 2020-09-13 ENCOUNTER — Telehealth: Payer: Self-pay | Admitting: Family Medicine

## 2020-09-13 NOTE — Telephone Encounter (Signed)
..   Medicaid Managed Care   Unsuccessful Outreach Note  09/13/2020 Name: Julia Cherry MRN: [id number] DOB: 1990/07/25  Referred by: [name], PA-C Reason for referral : High Risk Managed Medicaid (I called the patient today to get her phone visit with the LCSW rescheduled. I had to leave a message on her VM.)   An unsuccessful telephone outreach was attempted today. The patient was referred to the case management team for assistance with care management and care coordination.   Follow Up Plan: The care management team will reach out to the patient again over the next 7 days.   Weston Settle Care Guide, High Risk Medicaid Managed Care Embedded Care Coordination [facility]  [facility]

## 2020-09-13 NOTE — Patient Instructions (Signed)
[name] ,   The [facility] Managed Care Team is available to provide assistance to you with your healthcare needs at no cost and as a benefit of your [facility] Health plan. I'm sorry I was unable to reach you today for our scheduled appointment. Our care guide will call you to reschedule our telephone appointment. Please call me at the number below. I am available to be of assistance to you regarding your healthcare needs. .   Thank you,  Dickie La, BSW, MSW, LCSW Managed Medicaid LCSW [facility]  [address] [email or web address] Phone: [phone]

## 2020-09-13 NOTE — Patient Outreach (Signed)
  [facility]) Care Management  [facility] Social Work  09/13/2020  Julia Cherry September 24, 1990 [id number]  Encounter Medications:  Outpatient Encounter Medications as of 09/12/2020  Medication Sig   albuterol (VENTOLIN HFA) 108 (90 Base) MCG/ACT inhaler Inhale 2 puffs into the lungs every 4 (four) hours as needed for wheezing or shortness of breath.   busPIRone (BUSPAR) 5 MG tablet Take 1 tablet (5 mg total) by mouth 2 (two) times daily.   ipratropium-albuterol (DUONEB) 0.5-2.5 (3) MG/3ML SOLN Take 3 mLs by nebulization every 6 (six) hours as needed (asthma/wheeze/coughing fits).   montelukast (SINGULAIR) 10 MG tablet Take 1 tablet (10 mg total) by mouth at bedtime.   propranolol (INDERAL) 10 MG tablet Take 1 tablet (10 mg total) by mouth 3 (three) times daily as needed.   SYMBICORT 160-4.5 MCG/ACT inhaler Inhale 2 puffs into the lungs in the morning and at bedtime.   No facility-administered encounter medications on file as of 09/12/2020.    Functional Status:  In your present state of health, do you have any difficulty performing the following activities: 08/23/2020 07/26/2020  Hearing? N N  Vision? N N  Difficulty concentrating or making decisions? N N  Walking or climbing stairs? N N  Dressing or bathing? N N  Doing errands, shopping? N N  Some recent data might be hidden    Fall/Depression Screening:  PHQ 2/9 Scores 08/24/2020 08/23/2020 07/26/2020 07/10/2020 09/08/2019 07/25/2019 03/16/2019  PHQ - 2 Score 5 4 4  0 0 0 0  PHQ- 9 Score 16 15 4  - - 0 0    Assessment:  Care Plan There are no care plans that you recently modified to display for this patient.    Goals Addressed   None    LCSW completed [facility] outreach attempt today but was unable to reach patient successfully. A HIPPA compliant voice message was left encouraging patient to return call once available. LCSW will ask Scheduling Care Guide to reschedule [facility] SW appointment with patient as well.  [facility], BSW, MSW, [facility] Managed Medicaid LCSW [facility]  [facility] [email or web address] Phone: [phone]

## 2020-09-21 ENCOUNTER — Telehealth: Payer: Self-pay | Admitting: Family Medicine

## 2020-09-21 NOTE — Telephone Encounter (Signed)
..   Medicaid Managed Care   Unsuccessful Outreach Note  09/21/2020 Name: Julia Cherry MRN: [id number] DOB: 29-Sep-1990  Referred by: [name], PA-C Reason for referral : High Risk Managed Medicaid (I called this patient today to get her phone visit with the MM LCSW rescheduled. I left my name and number on her VM.)   A second unsuccessful telephone outreach was attempted today. The patient was referred to the case management team for assistance with care management and care coordination.   Follow Up Plan: The care management team will reach out to the patient again over the next 7 days.   Weston Settle Care Guide, High Risk Medicaid Managed Care Embedded Care Coordination [facility]  [facility]

## 2020-09-24 ENCOUNTER — Telehealth (INDEPENDENT_AMBULATORY_CARE_PROVIDER_SITE_OTHER): Payer: Medicaid Other | Admitting: Family Medicine

## 2020-09-24 DIAGNOSIS — Z5329 Procedure and treatment not carried out because of patient's decision for other reasons: Secondary | ICD-10-CM

## 2020-09-24 DIAGNOSIS — Z91199 Patient's noncompliance with other medical treatment and regimen due to unspecified reason: Secondary | ICD-10-CM

## 2020-09-24 NOTE — Progress Notes (Signed)
Called several times by myself and nursing staff, no answer. No show for today's visit.

## 2020-09-28 ENCOUNTER — Telehealth: Payer: Self-pay | Admitting: Family Medicine

## 2020-09-28 NOTE — Telephone Encounter (Signed)
..   Medicaid Managed Care   Unsuccessful Outreach Note  09/28/2020 Name: Julia Cherry MRN: [id number] DOB: 1990-01-29  Referred by: [name], PA-C Reason for referral : High Risk Managed Medicaid (Third attempt to reach patient to reschedule her phone visit with the MM LCSW. I left my name and number on her VM.)   Third unsuccessful telephone outreach was attempted today. The patient was referred to the case management team for assistance with care management and care coordination. The patient's primary care provider has been notified of our unsuccessful attempts to make or maintain contact with the patient. The care management team is pleased to engage with this patient at any time in the future should he/she be interested in assistance from the care management team.   Follow Up Plan: We have been unable to make contact with the patient for follow up. The care management team is available to follow up with the patient after provider conversation with the patient regarding recommendation for care management engagement and subsequent re-referral to the care management team.   [facility]

## 2020-10-09 ENCOUNTER — Ambulatory Visit: Payer: Medicaid Other

## 2020-12-25 ENCOUNTER — Emergency Department
Admission: EM | Admit: 2020-12-25 | Discharge: 2020-12-25 | Disposition: A | Discharge status: Home / Self Care | Payer: 59 | Attending: Emergency Medicine | Admitting: Emergency Medicine

## 2020-12-25 ENCOUNTER — Other Ambulatory Visit: Payer: Self-pay

## 2020-12-25 DIAGNOSIS — B349 Viral infection, unspecified: Secondary | ICD-10-CM | POA: Insufficient documentation

## 2020-12-25 DIAGNOSIS — I10 Essential (primary) hypertension: Secondary | ICD-10-CM | POA: Insufficient documentation

## 2020-12-25 DIAGNOSIS — Z7951 Long term (current) use of inhaled steroids: Secondary | ICD-10-CM | POA: Insufficient documentation

## 2020-12-25 DIAGNOSIS — J45909 Unspecified asthma, uncomplicated: Secondary | ICD-10-CM | POA: Insufficient documentation

## 2020-12-25 DIAGNOSIS — Z20822 Contact with and (suspected) exposure to covid-19: Secondary | ICD-10-CM | POA: Insufficient documentation

## 2020-12-25 DIAGNOSIS — R059 Cough, unspecified: Secondary | ICD-10-CM | POA: Diagnosis present

## 2020-12-25 LAB — RESP PANEL BY RT-PCR (FLU A&B, COVID) ARPGX2
Influenza A by PCR: NEGATIVE
Influenza B by PCR: NEGATIVE
SARS Coronavirus 2 by RT PCR: NEGATIVE

## 2020-12-25 NOTE — ED Triage Notes (Signed)
Pt reports headache, bodyaches, sore throat, chills and cough for the past few days. NAD noted. Ambulatory

## 2020-12-25 NOTE — Discharge Instructions (Signed)
Please take Tylenol and ibuprofen/Advil for your pain.  It is safe to take them together, or to alternate them every few hours.  Take up to 1000mg of Tylenol at a time, up to 4 times per day.  Do not take more than 4000 mg of Tylenol in 24 hours.  For ibuprofen, take 400-600 mg, 4-5 times per day. ° ° °
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
## 2020-12-28 ENCOUNTER — Ambulatory Visit: Payer: Self-pay | Admitting: *Deleted

## 2020-12-28 NOTE — Telephone Encounter (Signed)
Reason for Disposition  [1] MILD difficulty breathing (e.g., minimal/no SOB at rest, SOB with walking, pulse <100) AND [2] NEW-onset or WORSE than normal    Requesting a virtual visit  Answer Assessment - Initial Assessment Questions 1. RESPIRATORY STATUS: "Describe your breathing?" (e.g., wheezing, shortness of breath, unable to speak, severe coughing)      Pt. Calling in.   C/o runny nose, sore throat, shortness of breath at night or if moving a lot, bronchitis acting up, headache, body aches.   Seen in ED Tues. And Covid and flu tests are negative.    My sinuses are with pressure. 2. ONSET: "When did this breathing problem begin?"      Shortness of breath is why I went to the ED.   I've had a headache for 4 straight days.   They didn't prescribe anything to take Robitussin.    I need something for the bronchitis.  I'm doing my breathing treatments at night for the wheezing. 3. PATTERN "Does the difficult breathing come and go, or has it been constant since it started?"      I'm short of breath at night and if I'm active. 4. SEVERITY: "How bad is your breathing?" (e.g., mild, moderate, severe)    - MILD: No SOB at rest, mild SOB with walking, speaks normally in sentences, can lie down, no retractions, pulse < 100.    - MODERATE: SOB at rest, SOB with minimal exertion and prefers to sit, cannot lie down flat, speaks in phrases, mild retractions, audible wheezing, pulse 100-120.    - SEVERE: Very SOB at rest, speaks in single words, struggling to breathe, sitting hunched forward, retractions, pulse > 120      Moderate 5. RECURRENT SYMPTOM: "Have you had difficulty breathing before?" If Yes, ask: "When was the last time?" and "What happened that time?"      Bronchitis makes me this way. 6. CARDIAC HISTORY: "Do you have any history of heart disease?" (e.g., heart attack, angina, bypass surgery, angioplasty)      Not asked 7. LUNG HISTORY: "Do you have any history of lung disease?"  (e.g., pulmonary  embolus, asthma, emphysema)     I have asthma and bronchitis 8. CAUSE: "What do you think is causing the breathing problem?"      Bronchitis 9. OTHER SYMPTOMS: "Do you have any other symptoms? (e.g., dizziness, runny nose, cough, chest pain, fever)     Runny nose, body aches, headache, no fever, sore throat, coughing sometimes mostly dry occasional mucus comes up.   Clearing my throat a lot.  No diarrhea or vomiting. 10. O2 SATURATION MONITOR:  "Do you use an oxygen saturation monitor (pulse oximeter) at home?" If Yes, "What is your reading (oxygen level) today?" "What is your usual oxygen saturation reading?" (e.g., 95%)       No 11. PREGNANCY: "Is there any chance you are pregnant?" "When was your last menstrual period?"       No 12. TRAVEL: "Have you traveled out of the country in the last month?" (e.g., travel history, exposures)       My co workers are all sick with flu and RSV.  Protocols used: Breathing Difficulty-A-AH

## 2020-12-28 NOTE — Telephone Encounter (Signed)
°  Chief Complaint: shortness of breath and wheezing with bronchitis Symptoms: runny nose, sore throat, body aches, weak, shortness of breath with minimal exertion, wheezing. Frequency: constant Pertinent Negatives: Patient denies No diarrhea or fever, or vomiting.   Seen in the ED on Tues. And told she had a virus.   Covid and flu tests are negative. Disposition: [] ED /[] Urgent Care (no appt availability in office) / [x] Appointment(In office/virtual)/ []  [place] [facility]/ [] Home Care/ [] Refused Recommended Disposition  Additional Notes: Pt was warm transferred into [facility] to [place] for scheduling a virtual visit at pt's request.     I went over the care advice with her and s/s to go to the urgent care or ED for over the weekend if she got worse.   She verbalized understanding and was agreeable to this plan.

## 2021-01-23 ENCOUNTER — Other Ambulatory Visit: Payer: Self-pay

## 2021-01-23 ENCOUNTER — Ambulatory Visit
Admission: EM | Admit: 2021-01-23 | Discharge: 2021-01-23 | Disposition: A | Discharge status: Home / Self Care | Payer: 59 | Attending: Physician Assistant | Admitting: Physician Assistant

## 2021-01-23 DIAGNOSIS — R519 Headache, unspecified: Secondary | ICD-10-CM | POA: Diagnosis not present

## 2021-01-23 DIAGNOSIS — U071 COVID-19: Secondary | ICD-10-CM | POA: Diagnosis not present

## 2021-01-23 DIAGNOSIS — R051 Acute cough: Secondary | ICD-10-CM | POA: Diagnosis not present

## 2021-01-23 MED ORDER — MOLNUPIRAVIR 200 MG PO CAPS: 4.0000 | ORAL_CAPSULE | Freq: Two times a day (BID) | ORAL | 0 refills | Status: AC

## 2021-01-23 MED ORDER — MOLNUPIRAVIR 200 MG PO CAPS: 4.0000 | ORAL_CAPSULE | Freq: Two times a day (BID) | ORAL | 0 refills | Status: DC
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
## 2021-01-23 NOTE — ED Triage Notes (Signed)
Pt here with C/O testing positive for Covid today, had headache for 2 days, and fever of 100.

## 2021-01-23 NOTE — Discharge Instructions (Signed)
You have COVID. I sent antiviral meds.  Use inhaler as needed for shortness of breath   If symptomatic, go home and rest. Push fluids. Take Tylenol as needed for discomfort. Gargle warm salt water. Throat lozenges. Take Mucinex DM or Robitussin for cough. Humidifier in bedroom to ease coughing. Warm showers. Also review the COVID handout for more information.  COVID-19 INFECTION: The incubation period of COVID-19 is approximately 14 days after exposure, with most symptoms developing in roughly 4-5 days. Symptoms may range in severity from mild to critically severe. Roughly 80% of those infected will have mild symptoms. People of any age may become infected with COVID-19 and have the ability to transmit the virus. The most common symptoms include: fever, fatigue, cough, body aches, headaches, sore throat, nasal congestion, shortness of breath, nausea, vomiting, diarrhea, changes in smell and/or taste.    COURSE OF ILLNESS Some patients may begin with mild disease which can progress quickly into critical symptoms. If your symptoms are worsening please call ahead to the Emergency Department and proceed there for further treatment. Recovery time appears to be roughly 1-2 weeks for mild symptoms and 3-6 weeks for severe disease.   GO IMMEDIATELY TO ER FOR FEVER YOU ARE UNABLE TO GET DOWN WITH TYLENOL, BREATHING PROBLEMS, CHEST PAIN, FATIGUE, LETHARGY, INABILITY TO EAT OR DRINK, ETC  QUARANTINE AND ISOLATION: To help decrease the spread of COVID-19 please remain isolated if you have COVID infection or are highly suspected to have COVID infection. This means -stay home and isolate to one room in the home if you live with others. Do not share a bed or bathroom with others while ill, sanitize and wipe down all countertops and keep common areas clean and disinfected. Stay home for 5 days. If you have no symptoms or your symptoms are resolving after 5 days, you can leave your house. Continue to wear a mask around  others for 5 additional days. If you have been in close contact (within 6 feet) of someone diagnosed with COVID 19, you are advised to quarantine in your home for 14 days as symptoms can develop anywhere from 2-14 days after exposure to the virus. If you develop symptoms, you  must isolate.  Most current guidelines for COVID after exposure -unvaccinated: isolate 5 days and strict mask use x 5 days. Test on day 5 is possible -vaccinated: wear mask x 10 days if symptoms do not develop -You do not necessarily need to be tested for COVID if you have + exposure and  develop symptoms. Just isolate at home x10 days from symptom onset During this global pandemic, CDC advises to practice social distancing, try to stay at least 56ft away from others at all times. Wear a face covering. Wash and sanitize your hands regularly and avoid going anywhere that is not necessary.  KEEP IN MIND THAT THE COVID TEST IS NOT 100% ACCURATE AND YOU SHOULD STILL DO EVERYTHING TO PREVENT POTENTIAL SPREAD OF VIRUS TO OTHERS (WEAR MASK, WEAR GLOVES, WASH HANDS AND SANITIZE REGULARLY). IF INITIAL TEST IS NEGATIVE, THIS MAY NOT MEAN YOU ARE DEFINITELY NEGATIVE. MOST ACCURATE TESTING IS DONE 5-7 DAYS AFTER EXPOSURE.   It is not advised by CDC to get re-tested after receiving a positive COVID test since you can still test positive for weeks to months after you have already cleared the virus.   *If you have not been vaccinated for COVID, I strongly suggest you consider getting vaccinated as long as there are no contraindications.

## 2021-02-14 ENCOUNTER — Ambulatory Visit: Payer: Medicaid Other | Admitting: Nurse Practitioner

## 2021-02-14 ENCOUNTER — Other Ambulatory Visit: Payer: Self-pay | Admitting: Nurse Practitioner

## 2021-02-18 ENCOUNTER — Other Ambulatory Visit: Payer: Self-pay

## 2021-02-18 ENCOUNTER — Emergency Department
Admission: EM | Admit: 2021-02-18 | Discharge: 2021-02-19 | Disposition: A | Discharge status: Home / Self Care | Payer: 59 | Attending: Emergency Medicine | Admitting: Emergency Medicine

## 2021-02-18 DIAGNOSIS — J45909 Unspecified asthma, uncomplicated: Secondary | ICD-10-CM | POA: Insufficient documentation

## 2021-02-18 DIAGNOSIS — I10 Essential (primary) hypertension: Secondary | ICD-10-CM | POA: Insufficient documentation

## 2021-02-18 DIAGNOSIS — G43909 Migraine, unspecified, not intractable, without status migrainosus: Secondary | ICD-10-CM | POA: Insufficient documentation

## 2021-02-18 DIAGNOSIS — R519 Headache, unspecified: Secondary | ICD-10-CM | POA: Diagnosis present

## 2021-02-18 MED ORDER — PROCHLORPERAZINE MALEATE 10 MG PO TABS: 10.0000 mg | ORAL_TABLET | Freq: Four times a day (QID) | ORAL | 0 refills | Status: DC | PRN

## 2021-02-18 MED ORDER — PROCHLORPERAZINE EDISYLATE 10 MG/2ML IJ SOLN
10.0000 mg | Freq: Once | INTRAMUSCULAR | Status: AC
  Administered 2021-02-18: 10 mg via INTRAVENOUS
  Filled 2021-02-18: qty 2

## 2021-02-18 MED ORDER — DIPHENHYDRAMINE HCL 50 MG/ML IJ SOLN
25.0000 mg | Freq: Once | INTRAMUSCULAR | Status: AC
  Administered 2021-02-18: 25 mg via INTRAVENOUS
  Filled 2021-02-18: qty 1

## 2021-02-18 MED ORDER — SODIUM CHLORIDE 0.9 % IV BOLUS
1000.0000 mL | Freq: Once | INTRAVENOUS | Status: AC
  Administered 2021-02-18: 1000 mL via INTRAVENOUS

## 2021-02-18 NOTE — ED Provider Notes (Signed)
[facility] Provider Note    Event Date/Time   First MD Initiated Contact with Patient 02/18/21 2137     (approximate)   History   Chief Complaint Headache   HPI  Julia Cherry is a 31 y.o. female with past medical history of hypertension, asthma, anxiety, and migraines who presents to the ED complaining of headache.  Patient reports that she has been dealing with a waxing and waning headache for almost the past 2 weeks.  She describes the headache as diffuse and throbbing, exacerbated by bright lights and loud noises.  She has occasionally felt nauseous but has not vomited.  She denies any fevers or neck stiffness.  She describes symptoms as similar to prior migraines, for which she last saw a neurologist in 2015.  She has been taking Tylenol and ibuprofen for her symptoms at home without significant relief.  She denies any vision changes, speech changes, numbness, or weakness.     Physical Exam   Triage Vital Signs: ED Triage Vitals  Enc Vitals Group     BP 02/18/21 1956 139/90     Pulse Rate 02/18/21 1956 82     Resp 02/18/21 1956 14     Temp 02/18/21 1956 99.2 F (37.3 C)     Temp Source 02/18/21 1956 Oral     SpO2 02/18/21 1956 97 %     Weight --      Height --      Head Circumference --      Peak Flow --      Pain Score 02/18/21 1957 10     Pain Loc --      Pain Edu? --      Excl. in GC? --     Most recent vital signs: Vitals:   02/18/21 1956  BP: 139/90  Pulse: 82  Resp: 14  Temp: 99.2 F (37.3 C)  SpO2: 97%    Constitutional: Alert and oriented. Eyes: Conjunctivae are normal. Head: Atraumatic. Nose: No congestion/rhinnorhea. Mouth/Throat: Mucous membranes are moist.  Neck: No meningismus. Cardiovascular: Normal rate, regular rhythm. Grossly normal heart sounds.  2+ radial pulses bilaterally. Respiratory: Normal respiratory effort.  No retractions. Lungs CTAB. Gastrointestinal: Soft and nontender. No  distention. Musculoskeletal: No lower extremity tenderness nor edema.  Neurologic:  Normal speech and language. No gross focal neurologic deficits are appreciated.    ED Results / Procedures / Treatments   Labs (all labs ordered are listed, but only abnormal results are displayed) Labs Reviewed - No data to display   PROCEDURES:  Critical Care performed: No  Procedures   MEDICATIONS ORDERED IN ED: Medications  prochlorperazine (COMPAZINE) injection 10 mg (10 mg Intravenous Given 02/18/21 2215)  diphenhydrAMINE (BENADRYL) injection 25 mg (25 mg Intravenous Given 02/18/21 2214)  sodium chloride 0.9 % bolus 1,000 mL (1,000 mLs Intravenous New Bag/Given 02/18/21 2215)     IMPRESSION / MDM / ASSESSMENT AND PLAN / ED COURSE  I reviewed the triage vital signs and the nursing notes.                              31 y.o. female with past medical history of hypertension, anxiety, asthma, and migraines who presents to the ED with waxing and waning headache for about the past 2 months that is exacerbated by light and loud noise.  Differential diagnosis includes, but is not limited to, migraine headache, tension headache, SAH, meningitis.  Patient  is nontoxic-appearing and in no acute distress, is afebrile here in the ED with no meningismus.  Given waxing and waning headache over almost 2 weeks, very low suspicion for [facility].  She has no focal neurologic deficits on exam and we will hold off on CT imaging of her head.  Patient was treated symptomatically with migraine cocktail and now reports she is feeling much better.  She is appropriate for outpatient follow-up with PCP and neurology, was counseled to return to the ED for new worsening symptoms, patient agrees with plan.      FINAL CLINICAL IMPRESSION(S) / ED DIAGNOSES   Final diagnoses:  Migraine without status migrainosus, not intractable, unspecified migraine type     Rx / DC Orders   ED Discharge Orders          Ordered     prochlorperazine (COMPAZINE) 10 MG tablet  Every 6 hours PRN        02/18/21 2317             Note:  This document was prepared using Dragon voice recognition software and may include unintentional dictation errors.   Chesley Noon, MD 02/18/21 2320

## 2021-02-18 NOTE — ED Triage Notes (Signed)
Pt presents via POV c/o headache since January 26th. Reports hx of migraines however states this is worse than usual. Ambulatory to triage.

## 2021-04-24 ENCOUNTER — Encounter: Payer: Self-pay | Admitting: Family Medicine

## 2021-04-24 ENCOUNTER — Ambulatory Visit: Payer: Self-pay | Admitting: *Deleted

## 2021-04-24 ENCOUNTER — Ambulatory Visit (INDEPENDENT_AMBULATORY_CARE_PROVIDER_SITE_OTHER): Payer: 59 | Admitting: Family Medicine

## 2021-04-24 VITALS — BP 152/80 | HR 113 | Resp 16 | Ht 62.0 in | Wt 158.0 lb

## 2021-04-24 DIAGNOSIS — F419 Anxiety disorder, unspecified: Secondary | ICD-10-CM | POA: Diagnosis not present

## 2021-04-24 MED ORDER — ESCITALOPRAM OXALATE 10 MG PO TABS
10.0000 mg | ORAL_TABLET | Freq: Every day | ORAL | 0 refills | Status: DC
Start: 2021-04-24 — End: 2021-08-28

## 2021-04-24 MED ORDER — BUSPIRONE HCL 10 MG PO TABS: 10.0000 mg | ORAL_TABLET | Freq: Two times a day (BID) | ORAL | 1 refills | Status: DC

## 2021-04-24 MED ORDER — PROCHLORPERAZINE MALEATE 10 MG PO TABS: 10.0000 mg | ORAL_TABLET | Freq: Four times a day (QID) | ORAL | 0 refills | Status: DC | PRN

## 2021-04-24 MED ORDER — HYDROXYZINE PAMOATE 25 MG PO CAPS: 25.0000 mg | ORAL_CAPSULE | Freq: Three times a day (TID) | ORAL | 0 refills | Status: DC | PRN

## 2021-04-24 NOTE — Telephone Encounter (Signed)
Pt is scheduled at 1pm with Dr.Rumball ?

## 2021-04-24 NOTE — Assessment & Plan Note (Signed)
Chronic, not well controlled. No SI. Will add SSRI and hydroxyzine prn for panic attacks. Continue buspar, will increase dose until SSRI therapeutic. Continue with counseling. Note provided for work. F/u in 2 weeks. ?

## 2021-04-24 NOTE — Progress Notes (Signed)
? ?  SUBJECTIVE:  ? ?CHIEF COMPLAINT / HPI:  ? ?Anxiety ?- Medications: buspirone BID ?- previously on xanax, trazodone, abilify, ativan, zoloft. ?- referred to psych at last visit 08/2020. ?- Taking: not taking propranolol.  ?- Counseling: yes, [facility] ?- Previous hospitalizations: yes, about a year ago.  ?- FH of psych illness: yes ?- Symptoms: down, fatigue, crying a lot ?- Current stressors: mom with breast cancer and recently had surgery. Daughter with mental health struggles and recently suicidal, now has appropriate resources and safe to self.  ? ? ?  04/24/2021  ?  1:06 PM 08/24/2020  ?  2:42 PM 08/23/2020  ? 11:17 AM  ?Depression screen PHQ 2/9  ?Decreased Interest 3 3 2   ?Down, Depressed, Hopeless 3 2 2   ?PHQ - 2 Score 6 5 4   ?Altered sleeping 3 3 3   ?Tired, decreased energy 3 3 3   ?Change in appetite 3 2 2   ?Feeling bad or failure about yourself  1 2 2   ?Trouble concentrating 1 0 0  ?Moving slowly or fidgety/restless 0 1 1  ?Suicidal thoughts 0 0 0  ?PHQ-9 Score 17 16 15   ?Difficult doing work/chores  Somewhat difficult Somewhat difficult  ? ? ?  04/24/2021  ?  1:07 PM 08/23/2020  ? 11:17 AM 07/26/2020  ?  2:51 PM  ?GAD 7 : Generalized Anxiety Score  ?Nervous, Anxious, on Edge 2 3 0  ?Control/stop worrying 3 2 0  ?Worry too much - different things 3 2 0  ?Trouble relaxing 2 2 0  ?Restless 2 1 0  ?Easily annoyed or irritable 3 3 0  ?Afraid - awful might happen 1 1 0  ?Total GAD 7 Score 16 14 0  ?Anxiety Difficulty  Somewhat difficult Not difficult at all  ? ? ? ? ? ?OBJECTIVE:  ? ?BP (!) 152/80   Pulse (!) 113   Resp 16   Ht 5\' 2"  (1.575 m)   Wt 158 lb (71.7 kg)   SpO2 99%   BMI 28.90 kg/m?   ?Gen: non toxic appearing ?Card: Reg rhythm, slightly tachycardic ?Lungs: CTAB ?Ext: WWP, no edema ?Psych: tearful, depressed mood and affect ? ? ?ASSESSMENT/PLAN:  ? ?Anxiety ?Chronic, not well controlled. No SI. Will add SSRI and hydroxyzine prn for panic attacks. Continue buspar, will increase dose until  SSRI therapeutic. Continue with counseling. Note provided for work. F/u in 2 weeks. ?  ? ? ? , DO ?

## 2021-04-24 NOTE — Patient Instructions (Signed)

## 2021-04-24 NOTE — Telephone Encounter (Signed)
?Chief Complaint: panic attack anxiety this am . Insurance will not cover her to talk with her counselor until may  ?Symptoms: fast heartbeat now but talking calmly. This am reported chest pain vomiting fast heart rate due to anxiety episode. No sleeping, crying, hx breaking out in hives.  ?Frequency: 2 days  ?Pertinent Negatives: Patient denies chest pain now no difficulty breathing, no suicidal thoughts. ?Disposition: [] ED /[] Urgent Care (no appt availability in office) / [x] Appointment(In office/virtual)/ []  [facility]/ [] Home Care/ [] Refused Recommended Disposition /[] [facility]/ []  Follow-up with PCP ?Additional Notes:  ? ?Patient has had 3 panic attacks this week . ? ? Reason for Disposition ? Symptoms interfere with work or school ? ?Answer Assessment - Initial Assessment Questions ?1. CONCERN: "Did anything happen that prompted you to call today?"  ?    Everyday anxiety gets worse ?2. ANXIETY SYMPTOMS: "Can you describe how you (your loved one; patient) have been feeling?" (e.g., tense, restless, panicky, anxious, keyed up, overwhelmed, sense of impending doom).  ?    Crying panicked, doesn't eat trouble sleeping calling out of work ?3. ONSET: "How long have you been feeling this way?" (e.g., hours, days, weeks) ?    2 days  ?4. SEVERITY: "How would you rate the level of anxiety?" (e.g., 0 - 10; or mild, moderate, severe). ?    Severe with chest pain , vomiting this am but feeling better now  ?5. FUNCTIONAL IMPAIRMENT: "How have these feelings affected your ability to do daily activities?" "Have you had more difficulty than usual doing your normal daily activities?" (e.g., getting better, same, worse; self-care, school, work, interactions) ?    Unable to work due to sleep issues  ?6. HISTORY: "Have you felt this way before?" "Have you ever been diagnosed with an anxiety problem in the past?" (e.g., generalized anxiety disorder, panic attacks, PTSD). If Yes, ask: "How was this  problem treated?" (e.g., medicines, counseling, etc.) ?    Last year and  ?7. RISK OF HARM - SUICIDAL IDEATION: "Do you ever have thoughts of hurting or killing yourself?" If Yes, ask:  "Do you have these feelings now?" "Do you have a plan on how you would do this?" ?    No  reports suicidal 13 years ago when pregnant  ?8. TREATMENT:  "What has been done so far to treat this anxiety?" (e.g., medicines, relaxation strategies). "What has helped?" ?    Mother's friend from work able to help this crisis this am  ?9. TREATMENT - THERAPIST: "Do you have a counselor or therapist? Name?" ?     Tracey from [facility]  ?10. POTENTIAL TRIGGERS: "Do you drink caffeinated beverages (e.g., coffee, colas, teas), and how much daily?" "Do you drink alcohol or use any drugs?" "Have you started any new medicines recently?" ?    Na  ?10. PATIENT SUPPORT: "Who is with you now?" "Who do you live with?" "Do you have family or friends who you can talk to?"  ?      Fiance and kids  ?11. OTHER SYMPTOMS: "Do you have any other symptoms?" (e.g., feeling depressed, trouble concentrating, trouble sleeping, trouble breathing, palpitations or fast heartbeat, chest pain, sweating, nausea, or diarrhea) ?      Crying, fast heartbeat this am with chest pain . Denies chest pain now feels fast heartbeat  ?12. PREGNANCY: "Is there any chance you are pregnant?" "When was your last menstrual period?" ?      na ? ?Protocols used:  Anxiety and Panic Attack-A-AH ? ?

## 2021-05-08 ENCOUNTER — Other Ambulatory Visit: Payer: Self-pay | Admitting: Family Medicine

## 2021-05-09 NOTE — Telephone Encounter (Signed)
Requested medication (s) are due for refill today: no ? ?Requested medication (s) are on the active medication list: yes ? ?Last refill:  04/24/21 ? ?Future visit scheduled: yes ? ?Notes to clinic:  Unable to refill per protocol, pharmacy requests a 90 day supply. Last refill 04/24/21 for 30, 0 refills ? ? ? ?  ?Requested Prescriptions  ?Pending Prescriptions Disp Refills  ? hydrOXYzine (VISTARIL) 25 MG capsule [Pharmacy Med Name: HYDROXYZINE PAM 25 MG CAP] 90 capsule 1  ?  Sig: Take 1 capsule (25 mg total) by mouth every 8 (eight) hours as needed for anxiety.  ?  ? Ear, Nose, and Throat:  Antihistamines 2 Passed - 05/08/2021  2:09 PM  ?  ?  Passed - Cr in normal range and within 360 days  ?  Creatinine  ?Date Value Ref Range Status  ?04/21/2014 0.71 mg/dL Final  ?  Comment:  ?  0.44-1.00 ?NOTE: New Reference Range ? 03/21/14 ?  ? ?Creatinine, Ser  ?Date Value Ref Range Status  ?07/05/2020 0.78 0.44 - 1.00 mg/dL Final  ?  ?  ?  ?  Passed - Valid encounter within last 12 months  ?  Recent Outpatient Visits   ? ?      ? 2 weeks ago Anxiety  ? [facility] [place] Dense M, DO  ? 7 months ago No-show for appointment  ? [facility] [place] Dense M, DO  ? 8 months ago Anxiety  ? [facility] [name], [place]  ? 9 months ago Tendinitis of shoulder, unspecified laterality  ? [facility] [name], NP  ? 10 months ago COVID-19  ? [facility] [name], NP  ? ?  ?  ? ? ?  ?  ?  ? ? ?

## 2021-05-23 ENCOUNTER — Other Ambulatory Visit: Payer: Self-pay

## 2021-05-23 ENCOUNTER — Emergency Department
Admission: EM | Admit: 2021-05-23 | Discharge: 2021-05-23 | Disposition: A | Discharge status: Home / Self Care | Payer: 59 | Attending: Emergency Medicine | Admitting: Emergency Medicine

## 2021-05-23 ENCOUNTER — Encounter: Payer: Self-pay | Admitting: Emergency Medicine

## 2021-05-23 DIAGNOSIS — S29012A Strain of muscle and tendon of back wall of thorax, initial encounter: Secondary | ICD-10-CM | POA: Insufficient documentation

## 2021-05-23 DIAGNOSIS — X501XXA Overexertion from prolonged static or awkward postures, initial encounter: Secondary | ICD-10-CM | POA: Diagnosis not present

## 2021-05-23 DIAGNOSIS — Y9389 Activity, other specified: Secondary | ICD-10-CM | POA: Insufficient documentation

## 2021-05-23 DIAGNOSIS — S29002A Unspecified injury of muscle and tendon of back wall of thorax, initial encounter: Secondary | ICD-10-CM | POA: Diagnosis present

## 2021-05-23 MED ORDER — METHOCARBAMOL 500 MG PO TABS
500.0000 mg | ORAL_TABLET | Freq: Once | ORAL | Status: DC
  Filled 2021-05-23 (×2): qty 1

## 2021-05-23 MED ORDER — TIZANIDINE HCL 2 MG PO CAPS: 4.0000 mg | ORAL_CAPSULE | Freq: Three times a day (TID) | ORAL | 0 refills | Status: DC | PRN

## 2021-05-23 MED ORDER — LIDOCAINE 5 % EX PTCH
1.0000 | MEDICATED_PATCH | CUTANEOUS | Status: DC
  Filled 2021-05-23: qty 1

## 2021-05-23 MED ORDER — LIDOCAINE 5 % EX PTCH: 1.0000 | MEDICATED_PATCH | Freq: Two times a day (BID) | CUTANEOUS | 1 refills | Status: DC

## 2021-05-23 MED ORDER — KETOROLAC TROMETHAMINE 30 MG/ML IJ SOLN
30.0000 mg | Freq: Once | INTRAMUSCULAR | Status: AC
  Administered 2021-05-23: 30 mg via INTRAMUSCULAR
  Filled 2021-05-23: qty 1

## 2021-05-23 MED ORDER — ACETAMINOPHEN 500 MG PO TABS
1000.0000 mg | ORAL_TABLET | Freq: Once | ORAL | Status: DC
  Filled 2021-05-23: qty 2

## 2021-05-23 NOTE — Discharge Instructions (Addendum)
Please take Tylenol and ibuprofen/Advil for your pain.  It is safe to take them together, or to alternate them every few hours.  Take up to 1000mg  of Tylenol at a time, up to 4 times per day.  Do not take more than 4000 mg of Tylenol in 24 hours.  For ibuprofen, take 400-600 mg, 4-5 times per day. ? ?Please use lidocaine patches at your site of pain.  Apply 1 patch at a time, leave on for 12 hours, then remove for 12 hours.  12 hours on, 12 hours off.  Do not apply more than 1 patch at a time. ? ?Use tizanidine/Zanaflex muscle relaxer up to 3 times daily for muscle spasms.  This medication may make you a bit sleepy, be careful driving or working with it. ? ?Attached information for a local neurosurgeon/spine surgeon that you can reach out to to be seen in the clinic to discuss your worsening back pain. ? ?You develop fevers, inability to pee, inability to feel any sensation to your groin, unable to walk or any falls ?

## 2021-05-23 NOTE — ED Notes (Signed)
E-signature pad unavailable - Pt verbalized understanding of D/C information - no additional concerns at this time.  

## 2021-05-23 NOTE — ED Triage Notes (Signed)
Patient ambulatory to triage with steady gait, without difficulty or distress noted; pt reports last night while in bed she pulled a muscle in her back; cont to have upper back pain unrelieved by OTC ?

## 2021-05-23 NOTE — ED Provider Notes (Signed)
? ?[facility] ?Provider Note ? ? ? Event Date/Time  ? First MD Initiated Contact with Patient 05/23/21 0549   ?  (approximate) ? ? ?History  ? ?Back Pain ? ? ?HPI ? ?Julia Cherry is a 31 y.o. female who presents to the ED for evaluation of Back Pain ?  ?Reviewed clinic visit from 4/12 patient was evaluated for anxiety. ? ?Patient presents to the ED, accompanied by her fianc?, for evaluation of acute on chronic back pain.  She reports a long history of lower thoracic back pain and disc issues.  Reports that she was playing with her young child last night when she leaned over to reach for the child and suddenly felt a pulling in popping sensation to her lower thoracic back causing acute on chronic pain.  Denies any fall to the ground, injury or trauma.  Denies any fever or saddle anesthesias.  Toileting this morning without retention or changes.  Took some Tylenol and OTC Biofreeze cream without significant movement so presents to the ED for evaluation. ? ? ?Physical Exam  ? ?Triage Vital Signs: ?ED Triage Vitals  ?Enc Vitals Group  ?   BP 05/23/21 0541 (!) 129/93  ?   Pulse Rate 05/23/21 0541 80  ?   Resp 05/23/21 0541 18  ?   Temp 05/23/21 0541 97.8 ?F (36.6 ?C)  ?   Temp Source 05/23/21 0541 Oral  ?   SpO2 05/23/21 0541 100 %  ?   Weight 05/23/21 0539 160 lb (72.6 kg)  ?   Height 05/23/21 0539 5\' 2"  (1.575 m)  ?   Head Circumference --   ?   Peak Flow --   ?   Pain Score 05/23/21 0539 9  ?   Pain Loc --   ?   Pain Edu? --   ?   Excl. in GC? --   ? ? ?Most recent vital signs: ?Vitals:  ? 05/23/21 0541  ?BP: (!) 129/93  ?Pulse: 80  ?Resp: 18  ?Temp: 97.8 ?F (36.6 ?C)  ?SpO2: 100%  ? ? ?General: Awake, no distress.  Ambulatory independently with a normal gait. ?CV:  Good peripheral perfusion.  ?Resp:  Normal effort.  ?Abd:  No distention.  ?MSK:  No deformity noted.  ?Neuro:  No focal deficits appreciated. Cranial nerves II through XII intact ?5/5 strength and sensation in all 4  extremities ?Other:   ? ? ?ED Results / Procedures / Treatments  ? ?Labs ?(all labs ordered are listed, but only abnormal results are displayed) ?Labs Reviewed - No data to display ? ?EKG ? ? ?RADIOLOGY ? ? ?Official radiology report(s): ?No results found. ? ?PROCEDURES and INTERVENTIONS: ? ?Procedures ? ?Medications  ?acetaminophen (TYLENOL) tablet 1,000 mg (1,000 mg Oral Patient Refused/Not Given 05/23/21 0604)  ?lidocaine (LIDODERM) 5 % 1 patch (1 patch Transdermal Patient Refused/Not Given 05/23/21 0605)  ?methocarbamol (ROBAXIN) tablet 500 mg (500 mg Oral Patient Refused/Not Given 05/23/21 07/23/21)  ?ketorolac (TORADOL) 30 MG/ML injection 30 mg (30 mg Intramuscular Given 05/23/21 0605)  ? ? ? ?IMPRESSION / MDM / ASSESSMENT AND PLAN / ED COURSE  ?I reviewed the triage vital signs and the nursing notes. ? ?31 year old female presents to the ED with acute on chronic back pain suitable for outpatient management with multimodal analgesia.  She looks systemically well to me.  No signs of neurologic or vascular deficits.  No signs of trauma.  No red flag features to suggest cord compression or cauda equina.  No indication for imaging as she has had no injuries and has a reassuring examination.  Doubt fracture, or impingements.  Provided multimodal nonnarcotic analgesia and provided.  We discussed return precautions and she is suitable for outpatient management.  Referred to neurosurgery for outpatient evaluation. ? ?Clinical Course as of 05/23/21 0705  ?Thu May 23, 2021  ?0650 Reassessed. [DS]  ?  ?Clinical Course User Index ?[DS] Delton Prairie, MD  ? ? ? ?FINAL CLINICAL IMPRESSION(S) / ED DIAGNOSES  ? ?Final diagnoses:  ?Strain of thoracic back region  ? ? ? ?Rx / DC Orders  ? ?ED Discharge Orders   ? ?      Ordered  ?  tizanidine (ZANAFLEX) 2 MG capsule  3 times daily PRN       ? 05/23/21 0656  ?  lidocaine (LIDODERM) 5 %  Every 12 hours       ? 05/23/21 0656  ? ?  ?  ? ?  ? ? ? ?Note:  This document was prepared using  Dragon voice recognition software and may include unintentional dictation errors. ?  ?Delton Prairie, MD ?05/23/21 [phone] ? ?

## 2021-05-29 ENCOUNTER — Other Ambulatory Visit: Payer: Self-pay | Admitting: Physical Medicine & Rehabilitation

## 2021-05-29 DIAGNOSIS — G8929 Other chronic pain: Secondary | ICD-10-CM | POA: Diagnosis not present

## 2021-05-29 DIAGNOSIS — M5414 Radiculopathy, thoracic region: Secondary | ICD-10-CM | POA: Diagnosis not present

## 2021-05-29 DIAGNOSIS — M546 Pain in thoracic spine: Secondary | ICD-10-CM | POA: Diagnosis not present

## 2021-06-07 ENCOUNTER — Ambulatory Visit
Admission: RE | Admit: 2021-06-07 | Discharge: 2021-06-07 | Disposition: A | Discharge status: Home / Self Care | Payer: 59 | Source: Ambulatory Visit | Attending: Physical Medicine & Rehabilitation | Admitting: Physical Medicine & Rehabilitation

## 2021-06-07 DIAGNOSIS — G8929 Other chronic pain: Secondary | ICD-10-CM | POA: Diagnosis not present

## 2021-06-07 DIAGNOSIS — M546 Pain in thoracic spine: Secondary | ICD-10-CM | POA: Diagnosis not present

## 2021-06-11 DIAGNOSIS — G8929 Other chronic pain: Secondary | ICD-10-CM | POA: Diagnosis not present

## 2021-06-11 DIAGNOSIS — M546 Pain in thoracic spine: Secondary | ICD-10-CM | POA: Diagnosis not present

## 2021-06-11 DIAGNOSIS — M5441 Lumbago with sciatica, right side: Secondary | ICD-10-CM | POA: Diagnosis not present

## 2021-07-15 ENCOUNTER — Other Ambulatory Visit: Payer: Self-pay

## 2021-07-15 ENCOUNTER — Emergency Department
Admission: EM | Admit: 2021-07-15 | Discharge: 2021-07-15 | Disposition: A | Discharge status: Home / Self Care | Payer: 59 | Attending: Emergency Medicine | Admitting: Emergency Medicine

## 2021-07-15 ENCOUNTER — Encounter: Payer: Self-pay | Admitting: Emergency Medicine

## 2021-07-15 DIAGNOSIS — J45909 Unspecified asthma, uncomplicated: Secondary | ICD-10-CM | POA: Diagnosis not present

## 2021-07-15 DIAGNOSIS — E876 Hypokalemia: Secondary | ICD-10-CM | POA: Diagnosis not present

## 2021-07-15 DIAGNOSIS — I10 Essential (primary) hypertension: Secondary | ICD-10-CM | POA: Insufficient documentation

## 2021-07-15 DIAGNOSIS — K529 Noninfective gastroenteritis and colitis, unspecified: Secondary | ICD-10-CM | POA: Diagnosis not present

## 2021-07-15 DIAGNOSIS — R112 Nausea with vomiting, unspecified: Secondary | ICD-10-CM | POA: Diagnosis present

## 2021-07-15 LAB — URINALYSIS, ROUTINE W REFLEX MICROSCOPIC
Bacteria, UA: NONE SEEN
Bilirubin Urine: NEGATIVE
Glucose, UA: NEGATIVE mg/dL
Ketones, ur: 5 mg/dL — AB
Nitrite: NEGATIVE
Protein, ur: 100 mg/dL — AB
RBC / HPF: >50 RBC/hpf — ABNORMAL HIGH (ref 0–5)
Specific Gravity, Urine: 1.031 — ABNORMAL HIGH (ref 1.005–1.030)
pH: 5 (ref 5.0–8.0)

## 2021-07-15 LAB — COMPREHENSIVE METABOLIC PANEL
ALT: 12 U/L (ref 0–44)
AST: 15 U/L (ref 15–41)
Albumin: 3.5 g/dL (ref 3.5–5.0)
Alkaline Phosphatase: 50 U/L (ref 38–126)
Anion gap: 5 (ref 5–15)
BUN: 11 mg/dL (ref 6–20)
CO2: 25 mmol/L (ref 22–32)
Calcium: 8.5 mg/dL — ABNORMAL LOW (ref 8.9–10.3)
Chloride: 108 mmol/L (ref 98–111)
Creatinine, Ser: 0.66 mg/dL (ref 0.44–1.00)
GFR, Estimated: >60 mL/min (ref >60)
Glucose, Bld: 103 mg/dL — ABNORMAL HIGH (ref 70–99)
Potassium: 3 mmol/L — ABNORMAL LOW (ref 3.5–5.1)
Sodium: 138 mmol/L (ref 135–145)
Total Bilirubin: 0.6 mg/dL (ref 0.3–1.2)
Total Protein: 7.3 g/dL (ref 6.5–8.1)

## 2021-07-15 LAB — LIPASE, BLOOD: Lipase: 23 U/L (ref 11–51)

## 2021-07-15 LAB — POC URINE PREG, ED: Preg Test, Ur: NEGATIVE

## 2021-07-15 LAB — CBC
HCT: 39.2 % (ref 36.0–46.0)
Hemoglobin: 12.8 g/dL (ref 12.0–15.0)
MCH: 28.3 pg (ref 26.0–34.0)
MCHC: 32.7 g/dL (ref 30.0–36.0)
MCV: 86.7 fL (ref 80.0–100.0)
Platelets: 283 10*3/uL (ref 150–400)
RBC: 4.52 MIL/uL (ref 3.87–5.11)
RDW: 13.1 % (ref 11.5–15.5)
WBC: 7.4 10*3/uL (ref 4.0–10.5)
nRBC: 0 % (ref 0.0–0.2)

## 2021-07-15 MED ORDER — ONDANSETRON HCL 4 MG/2ML IJ SOLN
4.0000 mg | Freq: Once | INTRAMUSCULAR | Status: AC
  Administered 2021-07-15: 4 mg via INTRAVENOUS
  Filled 2021-07-15: qty 2

## 2021-07-15 MED ORDER — DICYCLOMINE HCL 10 MG PO CAPS: 10.0000 mg | ORAL_CAPSULE | Freq: Three times a day (TID) | ORAL | 0 refills | Status: DC

## 2021-07-15 MED ORDER — NAPROXEN 500 MG PO TABS
500.0000 mg | ORAL_TABLET | Freq: Two times a day (BID) | ORAL | 0 refills | Status: DC
Start: 2021-07-15 — End: 2021-09-10

## 2021-07-15 MED ORDER — LACTATED RINGERS IV BOLUS
1000.0000 mL | Freq: Once | INTRAVENOUS | Status: AC
  Administered 2021-07-15: 1000 mL via INTRAVENOUS
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
## 2021-07-15 NOTE — ED Notes (Signed)
See triage note  Presents with some abd pain/cramping.  States low grade fever yesterday  positive n/v sat/sun  diarrhea started this am

## 2021-07-15 NOTE — Discharge Instructions (Signed)
Clear liquid diet today and then progressed to bland foods as tolerated tomorrow.  Take your medications as prescribed.  Follow-up with your primary care or return to the emergency department if you are not improving over the next 2 to 3 days.

## 2021-07-15 NOTE — ED Triage Notes (Signed)
Pt via POV from home. Pt c/o generalized upper cramping and NVD since Friday. States she has had a low grade fever. States she started her period on Friday. Denies any abd surgeries. Pt is A&Ox4 and NAD

## 2021-07-29 ENCOUNTER — Ambulatory Visit
Admission: EM | Admit: 2021-07-29 | Discharge: 2021-07-29 | Disposition: A | Discharge status: Home / Self Care | Payer: 59 | Attending: Internal Medicine | Admitting: Internal Medicine

## 2021-07-29 ENCOUNTER — Other Ambulatory Visit: Payer: Self-pay

## 2021-07-29 ENCOUNTER — Encounter: Payer: Self-pay | Admitting: Emergency Medicine

## 2021-07-29 DIAGNOSIS — L0501 Pilonidal cyst with abscess: Secondary | ICD-10-CM | POA: Diagnosis not present

## 2021-07-29 MED ORDER — CLINDAMYCIN HCL 300 MG PO CAPS: 300.0000 mg | ORAL_CAPSULE | Freq: Three times a day (TID) | ORAL | 0 refills | Status: DC

## 2021-07-29 NOTE — Discharge Instructions (Addendum)
Apply dry heat over the gauze for 20 minutes 3-4 times a day for 3-4 days Change the outer gauze dressing 1-2 times a day  And you may remove the packing in 4 days if there is little puss draining on the gauze. If you are not comfortable doing this, then come back.  You may take Tylenol or Ibuprofen as needed for pain.
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
## 2021-07-29 NOTE — ED Triage Notes (Addendum)
Pt states she has a internal hemorrhoid that started about 3 days ago. She states it is hard for her to sit down due to the pain. Denies rectal bleeding.

## 2021-08-26 ENCOUNTER — Telehealth: Payer: Self-pay | Admitting: Family Medicine

## 2021-08-26 NOTE — Telephone Encounter (Signed)
Copied from CRM [id number]. Topic: Medical Record Request - Patient ROI Request >> Aug 26, 2021 11:03 AM Everette C wrote: Reason for CRM: The patient has called to request additional documentation related to their sleep studies and sleep difficulties from 2013 and 2012  The patient shares that the sleep studies were done at the practice  The patient would like the additional notes and information related to the studies for proof of their sleep studies can be shown to their job   Please contact further when possible

## 2021-08-26 NOTE — Telephone Encounter (Signed)
Spoke with patient to get clarification on her request.  Patient stated that her sleep study was done at [facility] between 2012 -2013.  Patient stated that she called [facility] and was informed that her records were transferred here when she established care with office back in 2021.  I informed patient that we never received those records, but I will go ahead and send over a records request to [facility] and once we receive the records we would contact her.  Patient verbalized understanding.   Records request has been faxed to [facility] @ [phone] as of 08/26/21 @ 2:03 PM

## 2021-08-26 NOTE — Telephone Encounter (Signed)
Copied from CRM #[id number]. Topic: Medical Record Request - Patient ROI Request >> Aug 26, 2021 11:03 AM Julia Cherry wrote: Reason for CRM: The patient has called to request additional documentation related to their sleep studies and sleep difficulties from 2013 and 2012  The patient shares that the sleep studies were done at the practice  The patient would like the additional notes and information related to the studies for proof of their sleep studies can be shown to their job   Please contact further when possible 

## 2021-08-27 ENCOUNTER — Telehealth: Payer: Self-pay

## 2021-08-27 NOTE — Telephone Encounter (Signed)
Copied from CRM [id number]. Topic: Medical Record Request - Patient ROI Request >> Aug 26, 2021 11:03 AM Everette C wrote: Reason for CRM: The patient has called to request additional documentation related to their sleep studies and sleep difficulties from 2013 and 2012  The patient shares that the sleep studies were done at the practice  The patient would like the additional notes and information related to the studies for proof of their sleep studies can be shown to their job   Please contact further when possible   In  progress

## 2021-08-27 NOTE — Telephone Encounter (Signed)
No answer from pt left vm to pick up records as her request any time MON-FRI from 8am-12pm or 2pm-4pm.

## 2021-08-28 ENCOUNTER — Ambulatory Visit (INDEPENDENT_AMBULATORY_CARE_PROVIDER_SITE_OTHER): Payer: Medicaid Other | Admitting: Family Medicine

## 2021-08-28 ENCOUNTER — Telehealth: Payer: Self-pay

## 2021-08-28 ENCOUNTER — Encounter: Payer: Self-pay | Admitting: Family Medicine

## 2021-08-28 VITALS — BP 124/70 | HR 87 | Temp 98.3°F | Resp 16 | Ht 62.0 in | Wt 159.2 lb

## 2021-08-28 DIAGNOSIS — Z5181 Encounter for therapeutic drug level monitoring: Secondary | ICD-10-CM

## 2021-08-28 DIAGNOSIS — G4700 Insomnia, unspecified: Secondary | ICD-10-CM | POA: Diagnosis not present

## 2021-08-28 DIAGNOSIS — G4726 Circadian rhythm sleep disorder, shift work type: Secondary | ICD-10-CM | POA: Diagnosis not present

## 2021-08-28 DIAGNOSIS — F419 Anxiety disorder, unspecified: Secondary | ICD-10-CM

## 2021-08-28 DIAGNOSIS — Z8481 Family history of carrier of genetic disease: Secondary | ICD-10-CM | POA: Diagnosis not present

## 2021-08-28 DIAGNOSIS — Z1231 Encounter for screening mammogram for malignant neoplasm of breast: Secondary | ICD-10-CM

## 2021-08-28 DIAGNOSIS — R400 Somnolence: Secondary | ICD-10-CM

## 2021-08-28 MED ORDER — ESCITALOPRAM OXALATE 10 MG PO TABS: 10.0000 mg | ORAL_TABLET | Freq: Every day | ORAL | 3 refills | Status: AC

## 2021-08-28 MED ORDER — BUSPIRONE HCL 10 MG PO TABS: 10.0000 mg | ORAL_TABLET | Freq: Two times a day (BID) | ORAL | 1 refills | Status: AC

## 2021-08-28 MED ORDER — HYDROXYZINE PAMOATE 25 MG PO CAPS: 25.0000 mg | ORAL_CAPSULE | Freq: Three times a day (TID) | ORAL | 3 refills | Status: AC | PRN

## 2021-08-28 NOTE — Telephone Encounter (Signed)
Copied from CRM #[id number]. Topic: Medical Record Request - Patient ROI Request >> Aug 26, 2021 11:03 AM Everette C wrote: Reason for CRM: The patient has called to request additional documentation related to their sleep studies and sleep difficulties from 2013 and 2012  The patient shares that the sleep studies were done at the practice  The patient would like the additional notes and information related to the studies for proof of their sleep studies can be shown to their job   Please contact further when possible 

## 2021-08-28 NOTE — Telephone Encounter (Signed)
Copies are upfront in the pick up area for patient.
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
## 2021-08-30 ENCOUNTER — Encounter: Payer: Self-pay | Admitting: Family Medicine

## 2021-08-30 ENCOUNTER — Telehealth: Payer: Self-pay

## 2021-08-30 NOTE — Telephone Encounter (Signed)
Pt mom brought some genetic testing results from her and wanted her daughters PCP to review them. Pt mom stated she wants her daughters to do these genetic testing to be aware if they fall in the same score as her. Pt mom did these type of testing: Oncotype DX breast recurrence  They will be scanned under her chart fur future reference.

## 2021-09-02 ENCOUNTER — Telehealth: Payer: Self-pay

## 2021-09-02 NOTE — Telephone Encounter (Signed)
Copied from CRM #[id number]. Topic: Medical Record Request - Patient ROI Request >> Aug 26, 2021 11:03 AM Everette C wrote: Reason for CRM: The patient has called to request additional documentation related to their sleep studies and sleep difficulties from 2013 and 2012  The patient shares that the sleep studies were done at the practice  The patient would like the additional notes and information related to the studies for proof of their sleep studies can be shown to their job   Please contact further when possible 

## 2021-09-10 ENCOUNTER — Other Ambulatory Visit: Payer: Self-pay

## 2021-09-10 ENCOUNTER — Encounter: Payer: Self-pay | Admitting: Emergency Medicine

## 2021-09-10 ENCOUNTER — Emergency Department: Payer: Medicaid Other

## 2021-09-10 ENCOUNTER — Emergency Department
Admission: EM | Admit: 2021-09-10 | Discharge: 2021-09-10 | Disposition: A | Discharge status: Home / Self Care | Payer: Medicaid Other | Attending: Emergency Medicine | Admitting: Emergency Medicine

## 2021-09-10 DIAGNOSIS — Z20822 Contact with and (suspected) exposure to covid-19: Secondary | ICD-10-CM | POA: Insufficient documentation

## 2021-09-10 DIAGNOSIS — J45909 Unspecified asthma, uncomplicated: Secondary | ICD-10-CM | POA: Insufficient documentation

## 2021-09-10 DIAGNOSIS — R059 Cough, unspecified: Secondary | ICD-10-CM | POA: Diagnosis not present

## 2021-09-10 DIAGNOSIS — I10 Essential (primary) hypertension: Secondary | ICD-10-CM | POA: Diagnosis not present

## 2021-09-10 DIAGNOSIS — J209 Acute bronchitis, unspecified: Secondary | ICD-10-CM | POA: Diagnosis not present

## 2021-09-10 LAB — SARS CORONAVIRUS 2 BY RT PCR: SARS Coronavirus 2 by RT PCR: NEGATIVE

## 2021-09-10 MED ORDER — BENZONATATE 100 MG PO CAPS: 100.0000 mg | ORAL_CAPSULE | Freq: Three times a day (TID) | ORAL | 0 refills | Status: DC | PRN

## 2021-09-10 MED ORDER — HYDROCOD POLI-CHLORPHE POLI ER 10-8 MG/5ML PO SUER
5.0000 mL | Freq: Once | ORAL | Status: AC
  Administered 2021-09-10: 5 mL via ORAL
  Filled 2021-09-10: qty 5

## 2021-09-10 MED ORDER — PREDNISONE 10 MG (21) PO TBPK
ORAL_TABLET | ORAL | 0 refills | Status: DC
Start: 2021-09-10 — End: 2021-10-15

## 2021-09-10 MED ORDER — AZITHROMYCIN 250 MG PO TABS: ORAL_TABLET | ORAL | 0 refills | Status: DC

## 2021-09-10 NOTE — Discharge Instructions (Signed)
Follow-up with your regular doctor as needed.  Return if worsening.  Take medications as prescribed.

## 2021-09-10 NOTE — ED Triage Notes (Signed)
Patient ambulatory to triage with steady gait, without difficulty or distress noted; pt reports prod cough clear sputum since yesterday; denies accomp symptoms

## 2021-09-10 NOTE — ED Notes (Signed)
See triage note  Presents with cough   States cough is productive and started yesterday  Afebrile on arrival

## 2021-09-10 NOTE — ED Provider Notes (Signed)
[facility] Provider Note    Event Date/Time   First MD Initiated Contact with Patient 09/10/21 [phone]     (approximate)   History   Cough   HPI  Julia Cherry is a 31 y.o. female with history of hypertension, asthma and anxiety presents emergency department with a cough and congestion for 2 days.  No fever.  Patient states she gets bronchitis frequently.  States that the cough has been difficult to control.  No vomiting or diarrhea.  No known exposures to COVID.  Patient works at [employer] and will need a work note to be out.      Physical Exam   Triage Vital Signs: ED Triage Vitals  Enc Vitals Group     BP 09/10/21 0627 127/74     Pulse Rate 09/10/21 0627 80     Resp 09/10/21 0627 18     Temp 09/10/21 0627 97.6 F (36.4 C)     Temp Source 09/10/21 0627 Oral     SpO2 09/10/21 0627 95 %     Weight 09/10/21 0627 145 lb (65.8 kg)     Height 09/10/21 0627 5\' 2"  (1.575 m)     Head Circumference --      Peak Flow --      Pain Score 09/10/21 0629 0     Pain Loc --      Pain Edu? --      Excl. in GC? --     Most recent vital signs: Vitals:   09/10/21 0627  BP: 127/74  Pulse: 80  Resp: 18  Temp: 97.6 F (36.4 C)  SpO2: 95%     General: Awake, no distress.   CV:  Good peripheral perfusion. regular rate and  rhythm Resp:  Normal effort. Lungs CTA, cough is dry and hacking Abd:  No distention.   Other:      ED Results / Procedures / Treatments   Labs (all labs ordered are listed, but only abnormal results are displayed) Labs Reviewed  SARS CORONAVIRUS 2 BY RT PCR     EKG     RADIOLOGY Chest x-ray    PROCEDURES:   Procedures   MEDICATIONS ORDERED IN ED: Medications  chlorpheniramine-HYDROcodone (TUSSIONEX) 10-8 MG/5ML suspension 5 mL (has no administration in time range)     IMPRESSION / MDM / ASSESSMENT AND PLAN / ED COURSE  I reviewed the triage vital signs and the nursing notes.                               Differential diagnosis includes, but is not limited to, COVID, influenza, PNA, acute bronchitis  Patient's presentation is most consistent with acute complicated illness / injury requiring diagnostic workup.   COVID test negative, chest x-ray independently reviewed and interpreted by me as being negative for any acute abnormality  Due to the hacking cough we will give patient a dose of Tussionex while here in the ED.  She will be given a prescription for Sterapred, Z-Pak, cough medication.  She is to return emergency department if worsening.  See your regular doctor if not improving 3 days.  She is in agreement treatment plan.  Discharged in stable condition with a work note.      FINAL CLINICAL IMPRESSION(S) / ED DIAGNOSES   Final diagnoses:  Acute bronchitis, unspecified organism     Rx / DC Orders   ED Discharge Orders  Ordered    azithromycin (ZITHROMAX Z-PAK) 250 MG tablet        09/10/21 0737    predniSONE (STERAPRED UNI-PAK 21 TAB) 10 MG (21) TBPK tablet        09/10/21 0737    benzonatate (TESSALON PERLES) 100 MG capsule  3 times daily PRN        09/10/21 0737             Note:  This document was prepared using Dragon voice recognition software and may include unintentional dictation errors.    Faythe Ghee, PA-C 09/10/21 6195    Minna Antis, MD 09/10/21 [phone]

## 2021-10-01 ENCOUNTER — Inpatient Hospital Stay: Payer: BC Managed Care – PPO | Attending: Oncology | Admitting: Licensed Clinical Social Worker

## 2021-10-01 ENCOUNTER — Inpatient Hospital Stay: Payer: BC Managed Care – PPO

## 2021-10-01 ENCOUNTER — Encounter: Payer: Self-pay | Admitting: Licensed Clinical Social Worker

## 2021-10-01 DIAGNOSIS — Z800 Family history of malignant neoplasm of digestive organs: Secondary | ICD-10-CM

## 2021-10-01 DIAGNOSIS — Z803 Family history of malignant neoplasm of breast: Secondary | ICD-10-CM
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
## 2021-10-03 ENCOUNTER — Other Ambulatory Visit: Payer: Self-pay | Admitting: Family Medicine

## 2021-10-03 DIAGNOSIS — F419 Anxiety disorder, unspecified: Secondary | ICD-10-CM

## 2021-10-03 DIAGNOSIS — G4700 Insomnia, unspecified: Secondary | ICD-10-CM

## 2021-10-08 ENCOUNTER — Encounter: Payer: Self-pay | Admitting: Emergency Medicine

## 2021-10-08 ENCOUNTER — Emergency Department: Payer: BC Managed Care – PPO

## 2021-10-08 ENCOUNTER — Emergency Department
Admission: EM | Admit: 2021-10-08 | Discharge: 2021-10-08 | Disposition: A | Discharge status: Home / Self Care | Payer: BC Managed Care – PPO | Attending: Emergency Medicine | Admitting: Emergency Medicine

## 2021-10-08 ENCOUNTER — Other Ambulatory Visit: Payer: Self-pay

## 2021-10-08 DIAGNOSIS — R1084 Generalized abdominal pain: Secondary | ICD-10-CM | POA: Diagnosis not present

## 2021-10-08 DIAGNOSIS — R55 Syncope and collapse: Secondary | ICD-10-CM | POA: Insufficient documentation

## 2021-10-08 DIAGNOSIS — R197 Diarrhea, unspecified: Secondary | ICD-10-CM | POA: Insufficient documentation

## 2021-10-08 DIAGNOSIS — D689 Coagulation defect, unspecified: Secondary | ICD-10-CM | POA: Diagnosis not present

## 2021-10-08 DIAGNOSIS — R109 Unspecified abdominal pain: Secondary | ICD-10-CM | POA: Diagnosis not present

## 2021-10-08 DIAGNOSIS — I959 Hypotension, unspecified: Secondary | ICD-10-CM | POA: Diagnosis not present

## 2021-10-08 DIAGNOSIS — R112 Nausea with vomiting, unspecified: Secondary | ICD-10-CM | POA: Diagnosis not present

## 2021-10-08 DIAGNOSIS — Z20822 Contact with and (suspected) exposure to covid-19: Secondary | ICD-10-CM | POA: Diagnosis not present

## 2021-10-08 DIAGNOSIS — E860 Dehydration: Secondary | ICD-10-CM | POA: Diagnosis not present

## 2021-10-08 DIAGNOSIS — I10 Essential (primary) hypertension: Secondary | ICD-10-CM | POA: Diagnosis not present

## 2021-10-08 DIAGNOSIS — Z8616 Personal history of COVID-19: Secondary | ICD-10-CM | POA: Diagnosis not present

## 2021-10-08 DIAGNOSIS — R110 Nausea: Secondary | ICD-10-CM | POA: Diagnosis not present

## 2021-10-08 DIAGNOSIS — S0990XA Unspecified injury of head, initial encounter: Secondary | ICD-10-CM | POA: Diagnosis not present

## 2021-10-08 DIAGNOSIS — W19XXXA Unspecified fall, initial encounter: Secondary | ICD-10-CM | POA: Diagnosis not present

## 2021-10-08 LAB — COMPREHENSIVE METABOLIC PANEL
ALT: 12 U/L (ref 0–44)
AST: 17 U/L (ref 15–41)
Albumin: 3.6 g/dL (ref 3.5–5.0)
Alkaline Phosphatase: 47 U/L (ref 38–126)
Anion gap: 5 (ref 5–15)
BUN: 11 mg/dL (ref 6–20)
CO2: 25 mmol/L (ref 22–32)
Calcium: 8.8 mg/dL — ABNORMAL LOW (ref 8.9–10.3)
Chloride: 108 mmol/L (ref 98–111)
Creatinine, Ser: 0.63 mg/dL (ref 0.44–1.00)
GFR, Estimated: >60 mL/min (ref >60)
Glucose, Bld: 105 mg/dL — ABNORMAL HIGH (ref 70–99)
Potassium: 3.4 mmol/L — ABNORMAL LOW (ref 3.5–5.1)
Sodium: 138 mmol/L (ref 135–145)
Total Bilirubin: 0.5 mg/dL (ref 0.3–1.2)
Total Protein: 7.1 g/dL (ref 6.5–8.1)

## 2021-10-08 LAB — RESP PANEL BY RT-PCR (FLU A&B, COVID) ARPGX2
Influenza A by PCR: NEGATIVE
Influenza B by PCR: NEGATIVE
SARS Coronavirus 2 by RT PCR: NEGATIVE

## 2021-10-08 LAB — POC URINE PREG, ED: Preg Test, Ur: NEGATIVE

## 2021-10-08 LAB — URINALYSIS, ROUTINE W REFLEX MICROSCOPIC
Bilirubin Urine: NEGATIVE
Glucose, UA: NEGATIVE mg/dL
Hgb urine dipstick: NEGATIVE
Ketones, ur: NEGATIVE mg/dL
Leukocytes,Ua: NEGATIVE
Nitrite: NEGATIVE
Protein, ur: NEGATIVE mg/dL
Specific Gravity, Urine: 1.018 (ref 1.005–1.030)
pH: 7 (ref 5.0–8.0)

## 2021-10-08 LAB — LIPASE, BLOOD: Lipase: 27 U/L (ref 11–51)

## 2021-10-08 LAB — CBC
HCT: 37.3 % (ref 36.0–46.0)
Hemoglobin: 12.3 g/dL (ref 12.0–15.0)
MCH: 28.7 pg (ref 26.0–34.0)
MCHC: 33 g/dL (ref 30.0–36.0)
MCV: 86.9 fL (ref 80.0–100.0)
Platelets: 274 10*3/uL (ref 150–400)
RBC: 4.29 MIL/uL (ref 3.87–5.11)
RDW: 13.2 % (ref 11.5–15.5)
WBC: 8 10*3/uL (ref 4.0–10.5)
nRBC: 0 % (ref 0.0–0.2)

## 2021-10-08 MED ORDER — SODIUM CHLORIDE 0.9 % IV BOLUS
1000.0000 mL | Freq: Once | INTRAVENOUS | Status: AC
  Administered 2021-10-08: 1000 mL via INTRAVENOUS

## 2021-10-08 MED ORDER — ONDANSETRON 8 MG PO TBDP: 8.0000 mg | ORAL_TABLET | Freq: Three times a day (TID) | ORAL | 0 refills | Status: DC | PRN

## 2021-10-08 NOTE — ED Triage Notes (Signed)
First Nurse Note: Arrives via [facility] with C/O LUQ abdominal pain and nausea/vomiting since Sunday.  This morning patient had a syncopal episode.  On EMS arrival, patient responsive to painful stimuli, now AAOx3.  Skin warm and dry. Patient family state patient presented with headache, syncope, and vomiting with COVID  VS wnl.  4 mg zofran given.  18g RAC

## 2021-10-08 NOTE — ED Provider Notes (Signed)
[facility] Provider Note   Event Date/Time   First MD Initiated Contact with Patient 10/08/21 [phone]     (approximate) History  Abdominal Pain  HPI Julia Cherry is a 31 y.o. female with no stated past medical history who presents for nausea/vomiting/diarrhea over the past 3 days with associated syncopal event this morning.  Patient states that she attempted to get up from laying down when she felt acute lightheadedness and had to go to the floor.  Patient states that she does remember everything but feels as though she lost consciousness.  Patient states she has had similar symptoms in the past secondary to viral illnesses including the most recent was a COVID infection approximately 1 year prior to arrival.  Patient denies any recent travel or sick contacts.  Patient states that she has Zofran at home but has been unable to tolerate p.o. over the last 3 days ROS: Patient currently denies any vision changes, tinnitus, difficulty speaking, facial droop, sore throat, chest pain, shortness of breath, dysuria, or weakness/numbness/paresthesias in any extremity   Physical Exam  Triage Vital Signs: ED Triage Vitals  Enc Vitals Group     BP 10/08/21 0751 105/72     Pulse Rate 10/08/21 0750 76     Resp 10/08/21 0750 18     Temp 10/08/21 0750 98.4 F (36.9 C)     Temp Source 10/08/21 0750 Oral     SpO2 10/08/21 0751 97 %     Weight 10/08/21 0751 145 lb 1 oz (65.8 kg)     Height 10/08/21 0751 5\' 2"  (1.575 m)     Head Circumference --      Peak Flow --      Pain Score 10/08/21 0751 7     Pain Loc --      Pain Edu? --      Excl. in GC? --    Most recent vital signs: Vitals:   10/08/21 0750 10/08/21 0751  BP:  105/72  Pulse: 76 73  Resp: 18   Temp: 98.4 F (36.9 C)   SpO2:  97%   General: Awake, oriented x4. CV:  Good peripheral perfusion.  Resp:  Normal effort.  Abd:  No distention.  Other:  Overweight middle-aged African-American female laying in  bed in no acute distress ED Results / Procedures / Treatments  Labs (all labs ordered are listed, but only abnormal results are displayed) Labs Reviewed  COMPREHENSIVE METABOLIC PANEL - Abnormal; Notable for the following components:      Result Value   Potassium 3.4 (*)    Glucose, Bld 105 (*)    Calcium 8.8 (*)    All other components within normal limits  URINALYSIS, ROUTINE W REFLEX MICROSCOPIC - Abnormal; Notable for the following components:   Color, Urine YELLOW (*)    APPearance HAZY (*)    All other components within normal limits  RESP PANEL BY RT-PCR (FLU A&B, COVID) ARPGX2  LIPASE, BLOOD  CBC  POC URINE PREG, ED  RADIOLOGY ED MD interpretation: CT of the head without contrast interpreted by me shows no evidence of acute abnormalities including no intracerebral hemorrhage, obvious masses, or significant edema -Agree with radiology assessment Official radiology report(s): CT HEAD WO CONTRAST (10/10/21)  Result Date: 10/08/2021 CLINICAL DATA:  Head trauma, coagulopathy (Age 28-64y) EXAM: CT HEAD WITHOUT CONTRAST TECHNIQUE: Contiguous axial images were obtained from the base of the skull through the vertex without intravenous contrast. RADIATION DOSE REDUCTION: This exam was  performed according to the departmental dose-optimization program which includes automated exposure control, adjustment of the mA and/or kV according to patient size and/or use of iterative reconstruction technique. COMPARISON:  None Available. FINDINGS: Brain: No evidence of acute infarction, hemorrhage, hydrocephalus, extra-axial collection or mass lesion/mass effect. Vascular: No hyperdense vessel. Skull: No acute fracture. Sinuses/Orbits: Clear sinuses.  No acute orbital findings. Other: No mastoid effusions. IMPRESSION: No evidence of acute intracranial abnormality. Electronically Signed   By: Margaretha Sheffield M.D.   On: 10/08/2021 08:17   PROCEDURES: Critical Care performed: No Procedures MEDICATIONS  ORDERED IN ED: Medications  sodium chloride 0.9 % bolus 1,000 mL (1,000 mLs Intravenous New Bag/Given 10/08/21 0840)   IMPRESSION / MDM / ASSESSMENT AND PLAN / ED COURSE  I reviewed the triage vital signs and the nursing notes.                             The patient is on the cardiac monitor to evaluate for evidence of arrhythmia and/or significant heart rate changes. Patient's presentation is most consistent with acute presentation with potential threat to life or bodily function. Patient presents for acute nausea/vomiting The cause of the patients symptoms is not clear, but the patient is overall well appearing and is suspected to have a transient course of illness.  Given History and Exam there does not appear to be an emergent cause of the symptoms such as small bowel obstruction, coronary syndrome, bowel ischemia, DKA, pancreatitis, appendicitis, other acute abdomen or other emergent problem.  Reassessment: After treatment, the patient is feeling much better, tolerating PO fluids, and shows no signs of dehydration.   Disposition: Discharge home with prompt primary care physician follow up in the next 48 hours. Strict return precautions discussed.   FINAL CLINICAL IMPRESSION(S) / ED DIAGNOSES   Final diagnoses:  Nausea vomiting and diarrhea  Dehydration   Rx / DC Orders   ED Discharge Orders          Ordered    ondansetron (ZOFRAN-ODT) 8 MG disintegrating tablet  Every 8 hours PRN        10/08/21 0934           Note:  This document was prepared using Dragon voice recognition software and may include unintentional dictation errors.   Naaman Plummer, MD 10/08/21 [phone]

## 2021-10-08 NOTE — ED Triage Notes (Addendum)
Pt here with abd pain that is generalized that started this morning. Pt states she has been nauseous and vomiting all morning. Pt also endorses diarrhea. Pt also states she fell and hit hier head this morning after feeling clammy and dizzy.

## 2021-10-14 ENCOUNTER — Ambulatory Visit: Payer: Self-pay | Admitting: *Deleted

## 2021-10-14 NOTE — Telephone Encounter (Signed)
  Chief Complaint: Blood Sugar Fluctuating. Symptoms: States "Passed out last Tuesday" Reports BS "High" at 106. States "Usually 80's." Also reports abdominal pain after eating "Feels full" No appetite, decreased energy.  Frequency: 10/08/21 Pertinent Negatives: Patient denies  Disposition: [] ED /[] Urgent Care (no appt availability in office) / [x] Appointment(In office/virtual)/ []  [facility]/ [] Home Care/ [] Refused Recommended Disposition /[] [facility]/ []  Follow-up with PCP Additional Notes: Appt secured for tomorrow AM. Care advise provided, pt verbalizes understanding.   Reason for Disposition  [1] Blood glucose 70  mg/dL (3.9 mmol/L) or below OR symptomatic, now improved with Care Advice AND [2] cause unknown    Appt secured for tomorrow.  No DX of DM.  Answer Assessment - Initial Assessment Questions 1. SYMPTOMS: "What symptoms are you concerned about?"     BS fluctuating 2. ONSET:  "When did the symptoms start?"     Last Tuesday "Passed out." 3. BLOOD GLUCOSE: "What is your blood glucose level?"      106 today. 4. USUAL RANGE: "What is your blood glucose level usually?" (e.g., usual fasting morning value, usual evening value)     80's 5. TYPE 1 or 2:  "Do you know what type of diabetes you have?"  (e.g., Type 1, Type 2, Gestational; doesn't know)      no 6. INSULIN: "Do you take insulin?" "What type of insulin(s) do you use? What is the mode of delivery? (syringe, pen; injection or pump) "When did you last give yourself an insulin dose?" (i.e., time or hours/minutes ago) "How much did you give?" (i.e., how many units)     no 7. DIABETES PILLS: "Do you take any pills for your diabetes?" If Yes, ask: "What is the name of the medicine(s) that you take for high blood sugar?"     no 8. OTHER SYMPTOMS: "Do you have any symptoms?" (e.g., fever, frequent urination, difficulty breathing, vomiting)     Abdominal pain after eating "All over" Too full after eating. No  appetite 9. LOW BLOOD GLUCOSE TREATMENT: "What have you done so far to treat the low blood glucose level?"     NA 10. FOOD: "When did you last eat or drink?"       NA 11. ALONE: "Are you alone right now or is someone with you?"        No symptoms presently  Protocols used: Diabetes - Low Blood Sugar-A-AH

## 2021-10-15 ENCOUNTER — Ambulatory Visit (INDEPENDENT_AMBULATORY_CARE_PROVIDER_SITE_OTHER): Payer: BC Managed Care – PPO | Admitting: Family Medicine

## 2021-10-15 ENCOUNTER — Ambulatory Visit: Payer: Self-pay | Admitting: Licensed Clinical Social Worker

## 2021-10-15 ENCOUNTER — Encounter: Payer: Self-pay | Admitting: Family Medicine

## 2021-10-15 ENCOUNTER — Encounter: Payer: Self-pay | Admitting: Licensed Clinical Social Worker

## 2021-10-15 ENCOUNTER — Telehealth: Payer: Self-pay | Admitting: Licensed Clinical Social Worker

## 2021-10-15 VITALS — BP 130/76 | HR 96 | Temp 97.8°F | Resp 16 | Ht 62.0 in | Wt 154.5 lb

## 2021-10-15 DIAGNOSIS — Z09 Encounter for follow-up examination after completed treatment for conditions other than malignant neoplasm: Secondary | ICD-10-CM | POA: Diagnosis not present

## 2021-10-15 DIAGNOSIS — Z1379 Encounter for other screening for genetic and chromosomal anomalies: Secondary | ICD-10-CM

## 2021-10-15 DIAGNOSIS — R400 Somnolence: Secondary | ICD-10-CM | POA: Insufficient documentation

## 2021-10-15 DIAGNOSIS — G4726 Circadian rhythm sleep disorder, shift work type: Secondary | ICD-10-CM | POA: Insufficient documentation

## 2021-10-15 DIAGNOSIS — R109 Unspecified abdominal pain: Secondary | ICD-10-CM

## 2021-10-15 DIAGNOSIS — Z1159 Encounter for screening for other viral diseases: Secondary | ICD-10-CM

## 2021-10-15 DIAGNOSIS — J302 Other seasonal allergic rhinitis: Secondary | ICD-10-CM | POA: Insufficient documentation

## 2021-10-15 DIAGNOSIS — E876 Hypokalemia: Secondary | ICD-10-CM | POA: Diagnosis not present

## 2021-10-15 DIAGNOSIS — Z1501 Genetic susceptibility to malignant neoplasm of breast: Secondary | ICD-10-CM

## 2021-10-15 DIAGNOSIS — R739 Hyperglycemia, unspecified: Secondary | ICD-10-CM

## 2021-10-15 DIAGNOSIS — R87612 Low grade squamous intraepithelial lesion on cytologic smear of cervix (LGSIL): Secondary | ICD-10-CM

## 2021-10-15 DIAGNOSIS — K219 Gastro-esophageal reflux disease without esophagitis: Secondary | ICD-10-CM

## 2021-10-15 DIAGNOSIS — R002 Palpitations: Secondary | ICD-10-CM | POA: Diagnosis not present

## 2021-10-15 DIAGNOSIS — J4541 Moderate persistent asthma with (acute) exacerbation: Secondary | ICD-10-CM | POA: Insufficient documentation

## 2021-10-15 DIAGNOSIS — Z8481 Family history of carrier of genetic disease: Secondary | ICD-10-CM | POA: Insufficient documentation

## 2021-10-15 DIAGNOSIS — R55 Syncope and collapse: Secondary | ICD-10-CM

## 2021-10-15 DIAGNOSIS — R197 Diarrhea, unspecified: Secondary | ICD-10-CM

## 2021-10-15 DIAGNOSIS — R112 Nausea with vomiting, unspecified: Secondary | ICD-10-CM

## 2021-10-15 DIAGNOSIS — G4700 Insomnia, unspecified: Secondary | ICD-10-CM | POA: Insufficient documentation

## 2021-10-15 LAB — POCT URINALYSIS DIPSTICK
Bilirubin, UA: NEGATIVE
Glucose, UA: NEGATIVE
Ketones, UA: NEGATIVE
Nitrite, UA: NEGATIVE
Protein, UA: NEGATIVE
Spec Grav, UA: <=1.005 — AB (ref 1.010–1.025)
Urobilinogen, UA: 0.2 E.U./dL
pH, UA: 5 (ref 5.0–8.0)

## 2021-10-15 MED ORDER — PANTOPRAZOLE SODIUM 40 MG PO TBEC: 40.0000 mg | DELAYED_RELEASE_TABLET | Freq: Two times a day (BID) | ORAL | 2 refills | Status: DC

## 2021-10-15 MED ORDER — DICYCLOMINE HCL 20 MG PO TABS: 20.0000 mg | ORAL_TABLET | Freq: Three times a day (TID) | ORAL | 1 refills | Status: DC | PRN

## 2021-10-15 NOTE — Progress Notes (Signed)
Patient ID: Julia Cherry, female    DOB: August 12, 1990, 31 y.o.   MRN: [id number]  PCP: [name], PA-C  Chief Complaint  Patient presents with   Polydipsia   Dizziness    Pt fainted last Tuesday went to ER. Glucose lab was running high, pt states has family history with diabetes     Subjective:   Julia Cherry is a 31 y.o. female, presents to clinic with CC of the following:  HPI  Pt presents for eval of abd pain, N, D, f/up on ER visits and sx on and off since July, she also is concerned with elevated blood sugars - she has no hx of DM but family hx, since feeling ill she has used her moms glucometer and shes worried about sugars around 106 even when she hasn't eaten. ER presentation one week ago - for N/V/D and with possible syncope/LOC neg head CT, labs fairly unremarkable, she was given IVF and felt improved, was tolerating po and was discharged Similar episode in July No recent abx She continues to have abd pain, N, diarrhea watery bristol score 6-7 up to 3 x a day Abd pain comes and goes, often worse when she eats or crampying and pain after eating, across low abd w/o radiation, some epigastric discomfort as well.  Pasta has triggered sx severely before (prior to ER visit), last night she had 2 corndogs and felt fine, but work up this am with N, bloating, discomfort.  Often she feels full or nauseated only after a few bites She feels weak and tired - just not like herself She has only been using zofran There were other meds on list that she is not using (bentyl, compazine) she has hx of GERD but is not on meds for this right now    Patient Active Problem List   Diagnosis Date Noted   Shoulder tendonitis 07/26/2020   Anxiety 07/26/2020   Patella-femoral syndrome 07/26/2020   Postpartum care following vaginal delivery 06/24/2015   Back pain 05/31/2015   Back pain affecting pregnancy, antepartum 03/30/2015   Bilateral lower abdominal pain 02/27/2015    Abdominal pain affecting pregnancy, antepartum 02/09/2015      Current Outpatient Medications:    albuterol (VENTOLIN HFA) 108 (90 Base) MCG/ACT inhaler, Inhale 2 puffs into the lungs every 4 (four) hours as needed for wheezing or shortness of breath., Disp: 1 each, Rfl: 3   busPIRone (BUSPAR) 10 MG tablet, Take 1 tablet (10 mg total) by mouth 2 (two) times daily., Disp: 180 tablet, Rfl: 1   escitalopram (LEXAPRO) 10 MG tablet, Take 1 tablet (10 mg total) by mouth daily., Disp: 90 tablet, Rfl: 3   hydrOXYzine (VISTARIL) 25 MG capsule, Take 1 capsule (25 mg total) by mouth every 8 (eight) hours as needed for anxiety., Disp: 90 capsule, Rfl: 3   ipratropium-albuterol (DUONEB) 0.5-2.5 (3) MG/3ML SOLN, Take 3 mLs by nebulization every 6 (six) hours as needed (asthma/wheeze/coughing fits)., Disp: 360 mL, Rfl: 1   montelukast (SINGULAIR) 10 MG tablet, Take 1 tablet (10 mg total) by mouth at bedtime., Disp: 90 tablet, Rfl: 3   ondansetron (ZOFRAN-ODT) 8 MG disintegrating tablet, Take 1 tablet (8 mg total) by mouth every 8 (eight) hours as needed for nausea or vomiting., Disp: 20 tablet, Rfl: 0   prochlorperazine (COMPAZINE) 10 MG tablet, Take 1 tablet (10 mg total) by mouth every 6 (six) hours as needed for nausea or vomiting., Disp: 12 tablet, Rfl: 0   SYMBICORT  160-4.5 MCG/ACT inhaler, Inhale 2 puffs into the lungs in the morning and at bedtime., Disp: 1 each, Rfl: 12   tizanidine (ZANAFLEX) 2 MG capsule, Take 2 capsules (4 mg total) by mouth 3 (three) times daily as needed for muscle spasms., Disp: 25 capsule, Rfl: 0   Allergies  Allergen Reactions   Bee Venom Anaphylaxis   Strawberry (Diagnostic) Anaphylaxis     Social History   Tobacco Use   Smoking status: Never   Smokeless tobacco: Never  Vaping Use   Vaping Use: Never used  Substance Use Topics   Alcohol use: No   Drug use: No      Chart Review Today: I personally reviewed active problem list, medication list, allergies,  family history, social history, health maintenance, notes from last encounter, lab results, imaging with the patient/caregiver today.   Review of Systems  Constitutional:  Positive for diaphoresis and fatigue. Negative for appetite change and fever.  HENT: Negative.    Eyes: Negative.   Respiratory: Negative.    Cardiovascular: Negative.   Gastrointestinal:  Positive for abdominal pain, diarrhea, nausea and vomiting. Negative for abdominal distention, anal bleeding and blood in stool.  Endocrine: Negative.   Genitourinary: Negative.  Negative for dysuria, frequency and vaginal discharge.  Musculoskeletal: Negative.  Negative for back pain.  Skin: Negative.   Allergic/Immunologic: Negative.   Neurological: Negative.   Hematological: Negative.   Psychiatric/Behavioral: Negative.    All other systems reviewed and are negative.      Objective:   Vitals:   10/15/21 0820  BP: 130/76  Pulse: 96  Resp: 16  Temp: 97.8 F (36.6 C)  TempSrc: Oral  SpO2: 99%  Weight: 154 lb 8 oz (70.1 kg)  Height: 5\' 2"  (1.575 m)    Body mass index is 28.26 kg/m.  Physical Exam Vitals and nursing note reviewed.  Constitutional:      General: She is not in acute distress.    Appearance: Normal appearance. She is well-developed, well-groomed and overweight. She is not ill-appearing (mildly ill appearing), toxic-appearing or diaphoretic.  HENT:     Head: Normocephalic and atraumatic.     Right Ear: External ear normal.     Left Ear: External ear normal.     Nose: Nose normal.  Eyes:     General: No scleral icterus.       Right eye: No discharge.        Left eye: No discharge.     Conjunctiva/sclera: Conjunctivae normal.  Neck:     Trachea: No tracheal deviation.  Cardiovascular:     Rate and Rhythm: Regular rhythm. Tachycardia present.     Pulses: Normal pulses.     Heart sounds: Normal heart sounds. No murmur heard.    No friction rub. No gallop.  Pulmonary:     Effort: Pulmonary  effort is normal. No respiratory distress.     Breath sounds: No stridor.  Abdominal:     General: Bowel sounds are normal. There is no distension or abdominal bruit.     Palpations: Abdomen is soft. There is no shifting dullness, hepatomegaly, splenomegaly, mass or pulsatile mass.     Tenderness: There is abdominal tenderness in the epigastric area. There is no right CVA tenderness, left CVA tenderness, guarding or rebound. Negative signs include Murphy's sign and McBurney's sign.     Hernia: No hernia is present.  Skin:    General: Skin is warm and dry.     Coloration: Skin is not jaundiced or  pale.     Findings: No lesion or rash.  Neurological:     Mental Status: She is alert.     Motor: No abnormal muscle tone.     Coordination: Coordination normal.     Gait: Gait normal.  Psychiatric:        Mood and Affect: Mood normal.        Behavior: Behavior normal. Behavior is cooperative.      Results for orders placed or performed during the hospital encounter of 10/08/21  Resp Panel by RT-PCR (Flu A&B, Covid) Anterior Nasal Swab   Specimen: Anterior Nasal Swab  Result Value Ref Range   SARS Coronavirus 2 by RT PCR NEGATIVE NEGATIVE   Influenza A by PCR NEGATIVE NEGATIVE   Influenza B by PCR NEGATIVE NEGATIVE  Lipase, blood  Result Value Ref Range   Lipase 27 11 - 51 U/L  Comprehensive metabolic panel  Result Value Ref Range   Sodium 138 135 - 145 mmol/L   Potassium 3.4 (L) 3.5 - 5.1 mmol/L   Chloride 108 98 - 111 mmol/L   CO2 25 22 - 32 mmol/L   Glucose, Bld 105 (H) 70 - 99 mg/dL   BUN 11 6 - 20 mg/dL   Creatinine, Ser 1.61 0.44 - 1.00 mg/dL   Calcium 8.8 (L) 8.9 - 10.3 mg/dL   Total Protein 7.1 6.5 - 8.1 g/dL   Albumin 3.6 3.5 - 5.0 g/dL   AST 17 15 - 41 U/L   ALT 12 0 - 44 U/L   Alkaline Phosphatase 47 38 - 126 U/L   Total Bilirubin 0.5 0.3 - 1.2 mg/dL   GFR, Estimated >09 >60 mL/min   Anion gap 5 5 - 15  CBC  Result Value Ref Range   WBC 8.0 4.0 - 10.5 K/uL   RBC  4.29 3.87 - 5.11 MIL/uL   Hemoglobin 12.3 12.0 - 15.0 g/dL   HCT 45.4 09.8 - 11.9 %   MCV 86.9 80.0 - 100.0 fL   MCH 28.7 26.0 - 34.0 pg   MCHC 33.0 30.0 - 36.0 g/dL   RDW 14.7 82.9 - 56.2 %   Platelets 274 150 - 400 K/uL   nRBC 0.0 0.0 - 0.2 %  Urinalysis, Routine w reflex microscopic  Result Value Ref Range   Color, Urine YELLOW (A) YELLOW   APPearance HAZY (A) CLEAR   Specific Gravity, Urine 1.018 1.005 - 1.030   pH 7.0 5.0 - 8.0   Glucose, UA NEGATIVE NEGATIVE mg/dL   Hgb urine dipstick NEGATIVE NEGATIVE   Bilirubin Urine NEGATIVE NEGATIVE   Ketones, ur NEGATIVE NEGATIVE mg/dL   Protein, ur NEGATIVE NEGATIVE mg/dL   Nitrite NEGATIVE NEGATIVE   Leukocytes,Ua NEGATIVE NEGATIVE  POC urine preg, ED  Result Value Ref Range   Preg Test, Ur NEGATIVE NEGATIVE       Assessment & Plan:   1. Abdominal pain, unspecified abdominal location Some mild epigastric tenderness on exam, some discomfort as well to generalized lower abd, w/o guarding and rebound, she is able to eat and drink, abd pain, n, fullness, and loose to watery stool for 1 weeks and on and off for 3 months.  Bentyl for cramping Recommended bland diet and pushing clear fluids Very little downside to starting higher dose BID PPI for a few weeks and carafate as needed for pain with eating, and f/up with GI - though I explained its appropriate to sometimes wait a month or so prior to consulting with GI -  however with months of sx pt would like GI referral. Do suspect some continued gastroenteritis and likely some upper GI system inflammation which PPI should help with.  If diarrhea continued we could potentially test stool - however right now its been for a week with no recent abx use and it still may be viral. Will recheck labs May want to get some imaging done Ct vs [place] Urine testing again today - with culture to r/o UTI/pyelo   - CBC with Differential/Platelet - COMPLETE METABOLIC PANEL WITH GFR - Urine Culture -  Lipase - POCT urinalysis dipstick - dicyclomine (BENTYL) 20 MG tablet; Take 1 tablet (20 mg total) by mouth 3 (three) times daily as needed for spasms (abd cramping).  Dispense: 20 tablet; Refill: 1 - pantoprazole (PROTONIX) 40 MG tablet; Take 1 tablet (40 mg total) by mouth 2 (two) times daily.  Dispense: 60 tablet; Refill: 2  2. Hyperglycemia Reassured pt that with illness fasting glucose mildly elevated is not unusual We will check labs and address if A1C elevated - COMPLETE METABOLIC PANEL WITH GFR - Hemoglobin A1c  3. Encounter for examination following treatment at hospital Extensive ER review from July and Sept, labs and imaging all reviewed  4. Syncope, unspecified syncope type She was seen in ED, head CT unremarkable, no syncope since Orthostatic VS for the past 24 hrs:  BP- Lying Pulse- Lying BP- Sitting Pulse- Sitting BP- Standing at 0 minutes Pulse- Standing at 0 minutes  10/15/21 0820 128/78 98 134/82 106 124/80 115  No drop in BP, but some compensatory tachycardia - pt may still have some dehydration/hypovolemia  - TSH - Urine Culture - Orthostatic vital signs  5. Nausea and vomiting, unspecified vomiting type Tx underlying cause PPI  F/up GI if not improving in a few weeks  - Urine Culture - dicyclomine (BENTYL) 20 MG tablet; Take 1 tablet (20 mg total) by mouth 3 (three) times daily as needed for spasms (abd cramping).  Dispense: 20 tablet; Refill: 1 - pantoprazole (PROTONIX) 40 MG tablet; Take 1 tablet (40 mg total) by mouth 2 (two) times daily.  Dispense: 60 tablet; Refill: 2  6. Palpitations Will illness and diarrhea may be do to volume status and mild illness Will screen thyroid Pt has some mild tachycardia with just moving around the exam room - manual HR check was 100-108 Rhythm was regular per palpation and auscultation Unable to do 12 lead in clinic (broke) ECG's reviewed that were done in ED and previously in chart If after improving from acute illness  if palpitations and tachycardia continues - would refer to cardiology for further eval  - COMPLETE METABOLIC PANEL WITH GFR - TSH - Orthostatic vital signs  7. Hypokalemia Recheck labs - COMPLETE METABOLIC PANEL WITH GFR  8. Encounter for hepatitis C screening test for low risk patient due - Hepatitis C antibody   9. 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 Diarrhea, unspecified type on and off for months (July 2023)  11. Gastroesophageal reflux disease, unspecified whether esophagitis present history of, not on med, though having some symptoms, meds sent (see above)     [name], PA-C 10/15/21 8:52 AM

## 2021-10-15 NOTE — Telephone Encounter (Signed)
I contacted Ms. Morre to discuss her genetic testing results. 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 No other pathogenic variants were identified in the 77 genes analyzed. Detailed clinic note to follow.   The test report has been scanned into EPIC and is located under the Molecular Pathology section of the Results Review tab.  A portion of the result report is included below for reference.      Faith Rogue, MS, [facility] Genetic Counselor [email or web address] Phone: [phone]

## 2021-10-15 NOTE — Progress Notes (Signed)
HPI:   Julia Cherry was previously seen in the [facility] due to a family history of cancer, her mother's recent genetic testing that showed a CHEK2 moderate risk mutation, and concerns regarding a hereditary predisposition to cancer. Please refer to our prior cancer genetics clinic note for more information regarding our discussion, assessment and recommendations, at the time. Julia Cherry recent genetic test results were disclosed to her, as were recommendations warranted by these results. These results and recommendations are discussed in more detail below.  CANCER HISTORY:  Oncology History   No history exists.    FAMILY HISTORY:  We obtained a detailed, 4-generation family history.  Significant diagnoses are listed below: Family History  Problem Relation Age of Onset   Asthma Mother    Breast cancer Mother 31       CHEK2 mod risk mutation   Diabetes Maternal Grandmother    Hypertension Maternal Grandfather    Breast cancer Paternal Grandmother        dx 48s-40s   Colon cancer Paternal Grandfather        dx 63s   Congestive Heart Failure Other    Julia Cherry has 2 daughters, 74 and 6. She has 1 maternal half sister and 3 paternal half sisters, no cancers.   Julia Cherry's mother had breast cancer at 71 and underwent genetic testing that showed a CHEK2 moderate risk mutation.    Julia Cherry's father is living at 61. Paternal grandmother had breast cancer in her 31s-40s, grandfather had colon cancer in his 86s.   Julia Cherry is aware of previous family history of genetic testing for hereditary cancer risks. There is no reported Ashkenazi Jewish ancestry. There is no known consanguinity.       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 The remainder of testing was negative/normal.   The CancerNext-Expanded + RNAinsight gene panel offered by [employer] and includes sequencing and rearrangement analysis for the following 77 genes: IP, ALK, APC*, ATM*, AXIN2, BAP1, BARD1, BLM, BMPR1A, BRCA1*, BRCA2*, BRIP1*, CDC73, CDH1*,CDK4, CDKN1B, CDKN2A, CHEK2*, CTNNA1, DICER1, FANCC, FH, FLCN, GALNT12, KIF1B, LZTR1, MAX, MEN1, MET, MLH1*, MSH2*, MSH3, MSH6*, MUTYH*, NBN, NF1*, NF2, NTHL1, PALB2*, PHOX2B, PMS2*, POT1, PRKAR1A, PTCH1, PTEN*, RAD51C*, RAD51D*,RB1, RECQL, RET, SDHA, SDHAF2, SDHB, SDHC, SDHD, SMAD4, SMARCA4, SMARCB1, SMARCE1, STK11, SUFU, TMEM127, TP53*,TSC1, TSC2, VHL and XRCC2 (sequencing and deletion/duplication); EGFR, EGLN1, HOXB13, KIT, MITF, PDGFRA, POLD1 and POLE (sequencing only); EPCAM and GREM1 (deletion/duplication only).  The test report has been scanned into EPIC and is located under the Molecular Pathology section of the Results Review tab.  A portion of the result report is included below for reference. Genetic testing reported out on 10/14/2021.     Genetic testing identified a variant of uncertain significance (VUS) in the RAD51D and SDHC genes.  At this time, it is unknown if this variant is associated with an increased risk for cancer or if it is benign, but most uncertain variants are reclassified to benign. It should not be used to make medical management decisions. With time, we suspect the laboratory will determine the significance of this variant, if any. If the laboratory reclassifies this variant, we will attempt to contact Julia Cherry to discuss it further.    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 Other testing labs call this mutation a VUS or Likely Benign. We could consider treating this a moderate/low penetrance CHEK2 variant.   We discussed the cancers, inheritance,  management asscociated with CHEK2, and the importance of telling family members about this result.   Clinical condition The CHEK2 gene is associated with an increased risk for autosomal dominant adult-onset cancers, including breast, colon, thyroid, prostate, and possibly others (PMID: 19509326, 71245809, 98338250, 53976734, 19379024). The risks of these cancers, particularly breast, have been determined to be both variant- and family history-dependent (PMID: 09735329, 92426834).   Lifetime risks for female breast cancer related to frameshift variants, such as 1100delC, have been estimated to be 25-39% in heterozygotes (PMID: 19622297, 98921194). The risks for most missense variants are unclear, but risks for certain variants (such as p.Ile157Thr) are thought to be lower (likely under 20%) and may not reach the threshold for management change.   Inheritance Hereditary predisposition to cancer due to pathogenic variants in the CHEK2 gene has autosomal dominant inheritance. This means that an individual with a pathogenic variant has a 50% chance of passing the condition on to his/her offspring. Most cases are inherited from a parent, but some cases may occur spontaneously (i.e., an individual with a pathogenic variant has parents who do not have it). Identification of a pathogenic variant allows for the recognition of at-risk relatives who can pursue testing for the familial variant.   Management:    Breast cancer -Moderate risk mutations do not cause the same cancer risks as other mutations in CHEK2; Julia Cherry may have an increased risk of breast cancer but lower than that of typical CHEK2 mutations. Generally, this type of mutation alone is not enough to change management, however breast MRIs in addition to mammograms could be considered.   Since Julia Cherry's breast risk based on the gene mutation is unclear, Tyrer Cuzick risk model was run to help determine breast risk. Tyrer Cuzick risk  model score shows a 19.7% breast risk for Julia Cherry. We have referred her to the high risk breast clinic based on this.       Colon Cancer -Consider colonoscopy every 5 years -If there is a close relative with colorectal cancer, start colonoscopies 10 years earlier than youngest age of cancer in the family   Julia Cherry does not report family history of colon cancer.    These guidelines are based on current NCCN guidelines (NCCN v.2.2024).  These guidelines are subject to change and continually updated and should be directly referenced for future medical management.      An individual's cancer risk and medical management are not determined by genetic test results alone. Overall cancer risk assessment incorporates additional factors, including personal medical history, family history, and any available genetic information that may result in a personalized plan for cancer prevention and surveillance.   Knowing if a pathogenic CHEK2 variant is present is advantageous. At-risk relatives can be identified, enabling pursuit of a diagnostic evaluation. Information regarding hereditary cancer susceptibility genes is constantly evolving, and more clinically relevant data regarding CHEK2 is likely to become available in the near future. Awareness of this cancer predisposition encourages patients and their providers to inform at-risk family members, to diligently follow condition-specific screening protocols, and to be vigilant in maintaining close and regular contact with their local genetics clinic in anticipation of new information.     FAMILY MEMBERS: It is important that all of Ms.  Cherry relatives (both men and women) know of the presence of this gene mutation. Site-specific genetic testing can sort out who in the family is at risk and who is not.    Julia Cherry's children and siblings have a 50% chance to have inherited this mutation. We recommend they have genetic testing for this  same mutation, as identifying the presence of this mutation would allow them to also take advantage of risk-reducing measures.    PLAN:   1. These results will be made available to her PCP. She would like a referral to the high risk breast clinic to discuss her management options further.   2. Julia Cherry plans to discuss these results with her family and will reach out to [place] if we can be of any assistance in coordinating genetic testing for any of her relatives.    SUPPORT AND RESOURCES: If Julia Cherry is interested in CHEK2-specific information and support, there are two groups, Facing Our Risk (www.facingourrisk.com) and Bright Pink (www.brightpink.org) which some people have found useful. They provide opportunities to speak with other individuals from high-risk families. To locate genetic counselors in other cities, visit the website of the [employer] (ArtistMovie.se) and Secretary/administrator for a Social worker by zip code.   We encouraged Julia Cherry to remain in contact with [place] on an annual basis so we can update her personal and family histories, and let her know of advances in cancer genetics that may benefit the family. Our contact number was provided. Julia Cherry's questions were answered to her satisfaction today, and she knows she is welcome to call anytime with additional questions.    Faith Rogue, MS, [facility] Genetic Counselor [email or web address] Phone: [phone]

## 2021-10-16 LAB — COMPLETE METABOLIC PANEL WITH GFR
AG Ratio: 1.4 (calc) (ref 1.0–2.5)
ALT: 14 U/L (ref 6–29)
AST: 15 U/L (ref 10–30)
Albumin: 4.5 g/dL (ref 3.6–5.1)
Alkaline phosphatase (APISO): 58 U/L (ref 31–125)
BUN: 13 mg/dL (ref 7–25)
CO2: 24 mmol/L (ref 20–32)
Calcium: 9.6 mg/dL (ref 8.6–10.2)
Chloride: 101 mmol/L (ref 98–110)
Creat: 0.75 mg/dL (ref 0.50–0.97)
Globulin: 3.2 g/dL (calc) (ref 1.9–3.7)
Glucose, Bld: 91 mg/dL (ref 65–99)
Potassium: 4.1 mmol/L (ref 3.5–5.3)
Sodium: 135 mmol/L (ref 135–146)
Total Bilirubin: 0.5 mg/dL (ref 0.2–1.2)
Total Protein: 7.7 g/dL (ref 6.1–8.1)
eGFR: 108 mL/min/{1.73_m2} (ref >=60)

## 2021-10-16 LAB — URINE CULTURE
MICRO NUMBER:: 14001672
Result:: NO GROWTH
SPECIMEN QUALITY:: ADEQUATE

## 2021-10-16 LAB — CBC WITH DIFFERENTIAL/PLATELET
Absolute Monocytes: 783 cells/uL (ref 200–950)
Basophils Absolute: 31 cells/uL (ref 0–200)
Basophils Relative: 0.3 %
Eosinophils Absolute: 288 cells/uL (ref 15–500)
Eosinophils Relative: 2.8 %
HCT: 43.4 % (ref 35.0–45.0)
Hemoglobin: 14.4 g/dL (ref 11.7–15.5)
Lymphs Abs: 2163 cells/uL (ref 850–3900)
MCH: 29.2 pg (ref 27.0–33.0)
MCHC: 33.2 g/dL (ref 32.0–36.0)
MCV: 88 fL (ref 80.0–100.0)
MPV: 10.4 fL (ref 7.5–12.5)
Monocytes Relative: 7.6 %
Neutro Abs: 7035 cells/uL (ref 1500–7800)
Neutrophils Relative %: 68.3 %
Platelets: 291 10*3/uL (ref 140–400)
RBC: 4.93 10*6/uL (ref 3.80–5.10)
RDW: 13.1 % (ref 11.0–15.0)
Total Lymphocyte: 21 %
WBC: 10.3 10*3/uL (ref 3.8–10.8)

## 2021-10-16 LAB — TSH: TSH: 1.71 mIU/L

## 2021-10-16 LAB — LIPASE: Lipase: 8 U/L (ref 7–60)

## 2021-10-16 LAB — HEMOGLOBIN A1C
Hgb A1c MFr Bld: 5.3 % of total Hgb (ref <5.7)
Mean Plasma Glucose: 105 mg/dL
eAG (mmol/L): 5.8 mmol/L

## 2021-10-16 LAB — HEPATITIS C ANTIBODY: Hepatitis C Ab: NONREACTIVE

## 2021-10-17 ENCOUNTER — Telehealth: Payer: Self-pay | Admitting: Family Medicine

## 2021-10-17 NOTE — Telephone Encounter (Signed)
Per Kristeen Miss PCP- not able to fill out short term disability paperwork for pt. Pt is aware and was told PCP can do FMLA paperwork.

## 2021-10-17 NOTE — Telephone Encounter (Signed)
Copied from [place] [id number]. Topic: General - Other >> Oct 17, 2021  9:30 AM Chapman Fitch wrote: Reason for CRM: [facility] called to confirm the attending physician statement form was received by fax yesterday / please advise asap/ ref claim# [id number]

## 2021-10-18 ENCOUNTER — Emergency Department
Admission: EM | Admit: 2021-10-18 | Discharge: 2021-10-18 | Disposition: A | Discharge status: Home / Self Care | Payer: BC Managed Care – PPO | Attending: Emergency Medicine | Admitting: Emergency Medicine

## 2021-10-18 ENCOUNTER — Emergency Department: Payer: BC Managed Care – PPO

## 2021-10-18 ENCOUNTER — Other Ambulatory Visit: Payer: Self-pay

## 2021-10-18 DIAGNOSIS — R197 Diarrhea, unspecified: Secondary | ICD-10-CM | POA: Diagnosis not present

## 2021-10-18 DIAGNOSIS — R112 Nausea with vomiting, unspecified: Secondary | ICD-10-CM | POA: Insufficient documentation

## 2021-10-18 DIAGNOSIS — R109 Unspecified abdominal pain: Secondary | ICD-10-CM | POA: Diagnosis not present

## 2021-10-18 DIAGNOSIS — R1032 Left lower quadrant pain: Secondary | ICD-10-CM | POA: Diagnosis not present

## 2021-10-18 HISTORY — DX: Anxiety disorder, unspecified: F41.9

## 2021-10-18 LAB — COMPREHENSIVE METABOLIC PANEL
ALT: 15 U/L (ref 0–44)
AST: 16 U/L (ref 15–41)
Albumin: 3.9 g/dL (ref 3.5–5.0)
Alkaline Phosphatase: 49 U/L (ref 38–126)
Anion gap: 4 — ABNORMAL LOW (ref 5–15)
BUN: 14 mg/dL (ref 6–20)
CO2: 24 mmol/L (ref 22–32)
Calcium: 8.9 mg/dL (ref 8.9–10.3)
Chloride: 107 mmol/L (ref 98–111)
Creatinine, Ser: 0.71 mg/dL (ref 0.44–1.00)
GFR, Estimated: >60 mL/min (ref >60)
Glucose, Bld: 98 mg/dL (ref 70–99)
Potassium: 3.3 mmol/L — ABNORMAL LOW (ref 3.5–5.1)
Sodium: 135 mmol/L (ref 135–145)
Total Bilirubin: 0.5 mg/dL (ref 0.3–1.2)
Total Protein: 7.8 g/dL (ref 6.5–8.1)

## 2021-10-18 LAB — CBC
HCT: 38.6 % (ref 36.0–46.0)
Hemoglobin: 12.8 g/dL (ref 12.0–15.0)
MCH: 28.6 pg (ref 26.0–34.0)
MCHC: 33.2 g/dL (ref 30.0–36.0)
MCV: 86.4 fL (ref 80.0–100.0)
Platelets: 302 10*3/uL (ref 150–400)
RBC: 4.47 MIL/uL (ref 3.87–5.11)
RDW: 13 % (ref 11.5–15.5)
WBC: 11.7 10*3/uL — ABNORMAL HIGH (ref 4.0–10.5)
nRBC: 0 % (ref 0.0–0.2)

## 2021-10-18 LAB — URINALYSIS, ROUTINE W REFLEX MICROSCOPIC
Bilirubin Urine: NEGATIVE
Glucose, UA: NEGATIVE mg/dL
Hgb urine dipstick: NEGATIVE
Ketones, ur: NEGATIVE mg/dL
Nitrite: NEGATIVE
Protein, ur: NEGATIVE mg/dL
Specific Gravity, Urine: 1.03 (ref 1.005–1.030)
pH: 5 (ref 5.0–8.0)

## 2021-10-18 LAB — POC URINE PREG, ED: Preg Test, Ur: NEGATIVE

## 2021-10-18 LAB — LIPASE, BLOOD: Lipase: 27 U/L (ref 11–51)

## 2021-10-18 MED ORDER — DICYCLOMINE HCL 10 MG PO CAPS
10.0000 mg | ORAL_CAPSULE | Freq: Once | ORAL | Status: AC
  Administered 2021-10-18: 10 mg via ORAL
  Filled 2021-10-18: qty 1

## 2021-10-18 MED ORDER — IOHEXOL 300 MG/ML  SOLN
100.0000 mL | Freq: Once | INTRAMUSCULAR | Status: AC | PRN
  Administered 2021-10-18: 100 mL via INTRAVENOUS

## 2021-10-18 MED ORDER — DICYCLOMINE HCL 10 MG PO CAPS: 10.0000 mg | ORAL_CAPSULE | Freq: Three times a day (TID) | ORAL | 0 refills | Status: DC | PRN

## 2021-10-18 NOTE — ED Notes (Signed)
Pt verbalized understanding of discharge.

## 2021-10-18 NOTE — ED Provider Notes (Signed)
[facility] Provider Note    Event Date/Time   First MD Initiated Contact with Patient 10/18/21 2035     (approximate)   History   Abdominal Pain   HPI  Ludell Chantia Amalfitano is a 31 y.o. female  who presents to the emergency department today because of concern for abdominal pain. Pain has been present for about one month. Started after the patient had a stomach flu. Pain is worse in the morning. Associated with nausea and vomiting. Patient has had loose stools during this time. Denies any blood in the stool or emesis. Denies any fevers. No unusual ingestions prior to the pain starting. Has seen PCP for this issue with work up including blood work.    Physical Exam   Triage Vital Signs: ED Triage Vitals  Enc Vitals Group     BP 10/18/21 1725 (!) 148/96     Pulse Rate 10/18/21 1725 (!) 103     Resp 10/18/21 1725 16     Temp 10/18/21 1725 98.4 F (36.9 C)     Temp Source 10/18/21 1725 Oral     SpO2 10/18/21 1725 100 %     Weight 10/18/21 1726 150 lb (68 kg)     Height 10/18/21 1726 5\' 2"  (1.575 m)     Head Circumference --      Peak Flow --      Pain Score 10/18/21 1726 7     Pain Loc --      Pain Edu? --      Excl. in GC? --     Most recent vital signs: Vitals:   10/18/21 1725  BP: (!) 148/96  Pulse: (!) 103  Resp: 16  Temp: 98.4 F (36.9 C)  SpO2: 100%    General: Awake, alert, oriented. CV:  Good peripheral perfusion. Regular rate and rhythm. Resp:  Normal effort. Lungs clear. Abd:  No distention. Diffusely tender.   ED Results / Procedures / Treatments   Labs (all labs ordered are listed, but only abnormal results are displayed) Labs Reviewed  COMPREHENSIVE METABOLIC PANEL - Abnormal; Notable for the following components:      Result Value   Potassium 3.3 (*)    Anion gap 4 (*)    All other components within normal limits  CBC - Abnormal; Notable for the following components:   WBC 11.7 (*)    All other components  within normal limits  URINALYSIS, ROUTINE W REFLEX MICROSCOPIC - Abnormal; Notable for the following components:   Color, Urine YELLOW (*)    APPearance CLOUDY (*)    Leukocytes,Ua LARGE (*)    Bacteria, UA RARE (*)    All other components within normal limits  LIPASE, BLOOD  POC URINE PREG, ED     EKG  None   RADIOLOGY I independently interpreted and visualized the CT abd/pel. My interpretation: No free air Radiology interpretation:  IMPRESSION:  1. No acute localizing process in the abdomen or pelvis.  2. Trace free fluid in the pelvis is likely physiologic.      PROCEDURES:  Critical Care performed: No  Procedures   MEDICATIONS ORDERED IN ED: Medications  iohexol (OMNIPAQUE) 300 MG/ML solution 100 mL (100 mLs Intravenous Contrast Given 10/18/21 2036)     IMPRESSION / MDM / ASSESSMENT AND PLAN / ED COURSE  I reviewed the triage vital signs and the nursing notes.  Differential diagnosis includes, but is not limited to, gastritis, pancreatitis, obstruction, UTI.  Patient's presentation is most consistent with acute presentation with potential threat to life or bodily function.  Patient presented to the emergency department today because of concerns for abdominal pain that is been going on for roughly 1 year.  Blood work without concerning elevation of lipase, hepatic function panels or white blood cell.  CT abdomen pelvis which was ordered prior to my evaluation without any concerning abnormality.  Patient states she did feel better after Bentyl.  At this time I think it is reasonable for patient be discharged home.  Patient states that she has been given GI referral by primary care.   FINAL CLINICAL IMPRESSION(S) / ED DIAGNOSES   Final diagnoses:  Abdominal pain, unspecified abdominal location      Note:  This document was prepared using Dragon voice recognition software and may include unintentional dictation errors.     Nance Pear, MD 10/18/21 2328

## 2021-10-18 NOTE — ED Notes (Signed)
Pt taken to CT.

## 2021-10-18 NOTE — ED Provider Triage Note (Signed)
  Emergency Medicine Provider Triage Evaluation Note  Julia Cherry , a 31 y.o.female,  was evaluated in triage.  Pt complains of abdominal pain.  Patient states that she has been having mid abdominal pain x3 weeks.  Associated with vomiting as well.  She states that she has a referral to GI but has not seen one yet.  She states that her primary care provider referred her to the emergency department for imaging.   Review of Systems  Positive: Abdominal pain, vomiting, Negative: Denies fever, chest pain, vomiting  Physical Exam   Vitals:   10/18/21 1725  BP: (!) 148/96  Pulse: (!) 103  Resp: 16  Temp: 98.4 F (36.9 C)  SpO2: 100%   Gen:   Awake, no distress   Resp:  Normal effort  MSK:   Moves extremities without difficulty  Other:  Moderate tenderness around the periumbilical and epigastric region   Medical Decision Making  Given the patient's initial medical screening exam, the following diagnostic evaluation has been ordered. The patient will be placed in the appropriate treatment space, once one is available, to complete the evaluation and treatment. I have discussed the plan of care with the patient and I have advised the patient that an ED physician or mid-level practitioner will reevaluate their condition after the test results have been received, as the results may give them additional insight into the type of treatment they may need.    Diagnostics: Labs, abdominal CT, UA  Treatments: none immediately   Teodoro Spray, [place] 10/18/21 1909

## 2021-10-18 NOTE — Discharge Instructions (Signed)
Please seek medical attention for any high fevers, chest pain, shortness of breath, change in behavior, persistent vomiting, bloody stool or any other new or concerning symptoms.  

## 2021-10-18 NOTE — ED Triage Notes (Signed)
Pt reports she has been having abd pain for 3 weeks and has received a referral to GI but has not yet seen one. She reports her abd pain has not gone away and now he abdomen appears swollen associated with lack of appetite and vomiting in the morning.

## 2021-10-21 ENCOUNTER — Telehealth: Payer: Self-pay | Admitting: Family Medicine

## 2021-10-21 NOTE — Telephone Encounter (Signed)
Pt went to ER 10/18/2021

## 2021-10-21 NOTE — Telephone Encounter (Signed)
Pt insist she was diagnosed with IBS I advised no dx code was used for that. Pt states if you can giver her an excuse note for the day of ER visit 10/18/21 since she was not given one and was told to call PCP

## 2021-10-21 NOTE — Telephone Encounter (Signed)
Copied from [place] [id number]. Topic: General - Other >> Oct 21, 2021  2:46 PM Leilani Able wrote: PT has said went ER and was diagnosed with IBS She was out from Monday 2nd thru today. She stated the office told her to call back if note needed, she does need note for these dates. MyChart note will be ok, If  ?? Can contact at [phone].

## 2021-10-23 ENCOUNTER — Ambulatory Visit: Payer: BC Managed Care – PPO

## 2021-10-23 NOTE — Telephone Encounter (Signed)
Note sent to Mychart being excused from 10/18/2021-10/20/2021.

## 2021-10-24 ENCOUNTER — Ambulatory Visit: Payer: BC Managed Care – PPO | Admitting: Nurse Practitioner

## 2021-10-24 ENCOUNTER — Ambulatory Visit: Payer: Self-pay | Admitting: *Deleted

## 2021-10-24 NOTE — Telephone Encounter (Signed)
  Chief Complaint: Abd pain for 4 weeks.   Been to ED and been seen in office for this. Symptoms: Severe abd pain thinks it's IBS related but not sure.  Diarrhea Frequency: For 4 weeks.     Pertinent Negatives: Patient denies vomiting Disposition: [] ED /[] Urgent Care (no appt availability in office) / [x] Appointment(In office/virtual)/ []  Page Virtual Care/ [] Home Care/ [] Refused Recommended Disposition /[] [facility]/ []  Follow-up with PCP Additional Notes: Appt made with Serafina Royals at pt's request for today at 3:40.    Did not want to see Delsa Grana, PA-C.

## 2021-10-24 NOTE — Telephone Encounter (Signed)
Reason for Disposition  [1] MODERATE pain (e.g., interferes with normal activities) AND [2] pain comes and goes (cramps) AND [3] present > 24 hours  (Exception: Pain with Vomiting or Diarrhea - see that Guideline.)  Answer Assessment - Initial Assessment Questions 1. LOCATION: "Where does it hurt?"      Abd pain for 4 weeks.   I've been to the hospital and I've seen Leisa.   I took Tylenol this morning.   I can't eat.   Diarrhea.   I'm getting worse.    I did have a CT scan in the ED.   My intestine is inflamed.   I have anxiety and it messes up my bowel.     I have Irritable Bowel Syndrome.    I'm in so much pain. 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 2. RADIATION: "Does the pain shoot anywhere else?" (e.g., chest, back)     Sometimes it goes into my back.   3. ONSET: "When did the pain begin?" (e.g., minutes, hours or days ago)      4 weeks 4. SUDDEN: "Gradual or sudden onset?"      5. PATTERN "Does the pain come and go, or is it constant?"    - If it comes and goes: "How long does it last?" "Do you have pain now?"     (Note: Comes and goes means the pain is intermittent. It goes away completely between bouts.)    - If constant: "Is it getting better, staying the same, or getting worse?"      (Note: Constant means the pain never goes away completely; most serious pain is constant and gets worse.)      Getting worse 6. SEVERITY: "How bad is the pain?"  (e.g., Scale 1-10; mild, moderate, or severe)    - MILD (1-3): Doesn't interfere with normal activities, abdomen soft and not tender to touch.     - MODERATE (4-7): Interferes with normal activities or awakens from sleep, abdomen tender to touch.     - SEVERE (8-10): Excruciating pain, doubled over, unable to do any normal activities.       Severe 7. RECURRENT SYMPTOM: "Have you ever had this type of stomach pain before?" If Yes, ask: "When was the last time?" and "What happened that time?"       Yes 8. CAUSE: "What do you think is causing the stomach pain?"     IBS I think 9. RELIEVING/AGGRAVATING FACTORS: "What makes it better or worse?" (e.g., antacids, bending or twisting motion, bowel movement)     Nothing is helping.   Tylenol I've taken this morning.    Every morning I have diarrhea.    10. OTHER SYMPTOMS: "Do you have any other symptoms?" (e.g., back pain, diarrhea, fever, urination pain, vomiting)       Diarrhea 11. PREGNANCY: "Is there any chance you are pregnant?" "When was your last menstrual period?"       No  Protocols used: Abdominal Pain - [facility]

## 2021-10-25 ENCOUNTER — Ambulatory Visit (INDEPENDENT_AMBULATORY_CARE_PROVIDER_SITE_OTHER): Payer: BC Managed Care – PPO | Admitting: Nurse Practitioner

## 2021-10-25 ENCOUNTER — Inpatient Hospital Stay: Payer: BC Managed Care – PPO | Attending: Oncology | Admitting: Internal Medicine

## 2021-10-25 ENCOUNTER — Encounter: Payer: Self-pay | Admitting: Nurse Practitioner

## 2021-10-25 ENCOUNTER — Other Ambulatory Visit: Payer: Self-pay

## 2021-10-25 ENCOUNTER — Encounter: Payer: Self-pay | Admitting: Internal Medicine

## 2021-10-25 VITALS — BP 118/76 | HR 93 | Temp 98.4°F | Resp 18 | Ht 62.0 in | Wt 154.3 lb

## 2021-10-25 VITALS — BP 133/86 | HR 84 | Temp 97.4°F | Ht 62.0 in | Wt 154.0 lb

## 2021-10-25 DIAGNOSIS — Z800 Family history of malignant neoplasm of digestive organs: Secondary | ICD-10-CM | POA: Diagnosis not present

## 2021-10-25 DIAGNOSIS — N644 Mastodynia: Secondary | ICD-10-CM | POA: Diagnosis not present

## 2021-10-25 DIAGNOSIS — Z1589 Genetic susceptibility to other disease: Secondary | ICD-10-CM | POA: Diagnosis not present

## 2021-10-25 DIAGNOSIS — R197 Diarrhea, unspecified: Secondary | ICD-10-CM | POA: Diagnosis not present

## 2021-10-25 DIAGNOSIS — R109 Unspecified abdominal pain: Secondary | ICD-10-CM

## 2021-10-25 DIAGNOSIS — Z803 Family history of malignant neoplasm of breast: Secondary | ICD-10-CM | POA: Diagnosis not present

## 2021-10-25 DIAGNOSIS — Z1509 Genetic susceptibility to other malignant neoplasm: Secondary | ICD-10-CM | POA: Insufficient documentation

## 2021-10-25 DIAGNOSIS — Z1501 Genetic susceptibility to malignant neoplasm of breast: Secondary | ICD-10-CM | POA: Diagnosis not present

## 2021-10-25 DIAGNOSIS — Z1502 Genetic susceptibility to malignant neoplasm of ovary: Secondary | ICD-10-CM

## 2021-10-25 MED ORDER — RIFAXIMIN 550 MG PO TABS: 550.0000 mg | ORAL_TABLET | Freq: Three times a day (TID) | ORAL | 0 refills | Status: AC

## 2021-10-25 NOTE — Progress Notes (Signed)
New patient referred by Genetic counseling for family history of breast cancer. Patient states PCP found spot on left breast in 2016 but was unable to get mammo due to being pregnant at the time. Patient states she felt lump last Thursday while doing self examination. Patient has very bad abdominal pain and constant diarrhea x 4 weeks. Patient also had dizzy spell and passed out 2 weeks ago.

## 2021-10-25 NOTE — Progress Notes (Signed)
BP 118/76   Pulse 93   Temp 98.4 F (36.9 C) (Oral)   Resp 18   Ht 5\' 2"  (1.575 m)   Wt 154 lb 4.8 oz (70 kg)   LMP 10/11/2021 (Approximate)   SpO2 98%   BMI 28.22 kg/m    Subjective:    Patient ID: Julia Cherry, female    DOB: [birth year], 31 y.o.   MRN: [id number]  HPI: Julia Cherry is a 31 y.o. female  Chief Complaint  Patient presents with   Abdominal Pain    For 4 weeks seen in ER   Abdominal pain: Patient reports she has been having generalized abdominal pain mainly around the umbilicus for more than four weeks. She says she does have a hernia.    She describes the pain as crampy and can be sharp at times. associated symptoms include diarrhea, nausea, loss of appetite and gassy.  She denies any fever, no constipation, no urinary symptoms. Is been seen in the emergency department several times for same complaint, last visit was 10/18/2021.  She is also seen her PCP Threasa Alpha, PA.  Work-up has been negative thus far.  Labs were normal, she had a CT abdomen pelvis which showed 1. No acute localizing process in the abdomen or pelvis.2. Trace free fluid in the pelvis is likely physiologic.  Patient thinks she has irritable bowel syndrome. She has tried bentyl, zofran, protonix, compazine and has had little relief.  She has not seen a GI doctor. Discussed with patient that we should get stool culture and go to gi.  Referral placed to GI.  Also discussed low FODMAP diet. Discussed options of trying amitriptyline vs xifaxan.  Discussed side effects and patient would like to try xifaxan. Prescription sent.    Relevant past medical, surgical, family and social history reviewed and updated as indicated. Interim medical history since our last visit reviewed. Allergies and medications reviewed and updated.  Review of Systems  Constitutional: Negative for fever or weight change.  Respiratory: Negative for cough and shortness of breath.   Cardiovascular: Negative for  chest pain or palpitations.  Gastrointestinal: positive for abdominal pain, diarrhea Musculoskeletal: Negative for gait problem or joint swelling.  Skin: Negative for rash.  Neurological: Negative for dizziness or headache.  No other specific complaints in a complete review of systems (except as listed in HPI above).      Objective:    BP 118/76   Pulse 93   Temp 98.4 F (36.9 C) (Oral)   Resp 18   Ht 5\' 2"  (1.575 m)   Wt 154 lb 4.8 oz (70 kg)   LMP 10/11/2021 (Approximate)   SpO2 98%   BMI 28.22 kg/m   Wt Readings from Last 3 Encounters:  10/25/21 154 lb 4.8 oz (70 kg)  10/25/21 154 lb (69.9 kg)  10/18/21 150 lb (68 kg)    Physical Exam  Constitutional: Patient appears well-developed and well-nourished. Obese  No distress.  HEENT: head atraumatic, normocephalic, pupils equal and reactive to light, neck supple Cardiovascular: Normal rate, regular rhythm and normal heart sounds.  No murmur heard. No BLE edema. Pulmonary/Chest: Effort normal and breath sounds normal. No respiratory distress. Abdominal: Soft.  Bowel sounds normal active, tenderness noted in the right upper quadrant and left lower quadrant. Psychiatric: Patient has a normal mood and affect. behavior is normal. Judgment and thought content normal.  Results for orders placed or performed during the hospital encounter of 10/18/21  Lipase, blood  Result  Value Ref Range   Lipase 27 11 - 51 U/L  Comprehensive metabolic panel  Result Value Ref Range   Sodium 135 135 - 145 mmol/L   Potassium 3.3 (L) 3.5 - 5.1 mmol/L   Chloride 107 98 - 111 mmol/L   CO2 24 22 - 32 mmol/L   Glucose, Bld 98 70 - 99 mg/dL   BUN 14 6 - 20 mg/dL   Creatinine, Ser 0.71 0.44 - 1.00 mg/dL   Calcium 8.9 8.9 - 10.3 mg/dL   Total Protein 7.8 6.5 - 8.1 g/dL   Albumin 3.9 3.5 - 5.0 g/dL   AST 16 15 - 41 U/L   ALT 15 0 - 44 U/L   Alkaline Phosphatase 49 38 - 126 U/L   Total Bilirubin 0.5 0.3 - 1.2 mg/dL   GFR, Estimated >60 >60 mL/min    Anion gap 4 (L) 5 - 15  CBC  Result Value Ref Range   WBC 11.7 (H) 4.0 - 10.5 K/uL   RBC 4.47 3.87 - 5.11 MIL/uL   Hemoglobin 12.8 12.0 - 15.0 g/dL   HCT 38.6 36.0 - 46.0 %   MCV 86.4 80.0 - 100.0 fL   MCH 28.6 26.0 - 34.0 pg   MCHC 33.2 30.0 - 36.0 g/dL   RDW 13.0 11.5 - 15.5 %   Platelets 302 150 - 400 K/uL   nRBC 0.0 0.0 - 0.2 %  Urinalysis, Routine w reflex microscopic  Result Value Ref Range   Color, Urine YELLOW (A) YELLOW   APPearance CLOUDY (A) CLEAR   Specific Gravity, Urine 1.030 1.005 - 1.030   pH 5.0 5.0 - 8.0   Glucose, UA NEGATIVE NEGATIVE mg/dL   Hgb urine dipstick NEGATIVE NEGATIVE   Bilirubin Urine NEGATIVE NEGATIVE   Ketones, ur NEGATIVE NEGATIVE mg/dL   Protein, ur NEGATIVE NEGATIVE mg/dL   Nitrite NEGATIVE NEGATIVE   Leukocytes,Ua LARGE (A) NEGATIVE   RBC / HPF 6-10 0 - 5 RBC/hpf   WBC, UA 6-10 0 - 5 WBC/hpf   Bacteria, UA RARE (A) NONE SEEN   Squamous Epithelial / LPF 21-50 0 - 5   Mucus PRESENT   POC urine preg, ED  Result Value Ref Range   Preg Test, Ur NEGATIVE NEGATIVE      Assessment & Plan:   Problem List Items Addressed This Visit   None Visit Diagnoses     Abdominal pain, unspecified abdominal location    -  Primary   Get stool cultures, use low FODMAP diet, continue taking Bentyl, Protonix, Compazine.  Start taking Xifaxan and follow-up with GI.   Relevant Medications   rifaximin (XIFAXAN) 550 MG TABS tablet   Other Relevant Orders   CALPROTECTIN   Ova and parasite examination   Salmonella/Shigella Cult, Campy EIA and Shiga Toxin reflex   Stool Giardia/Cryptosporidium   Ambulatory referral to Gastroenterology   Diarrhea, unspecified type       Get stool cultures, use low FODMAP diet, continue taking Bentyl, Protonix, Compazine.  Start taking Xifaxan and follow-up with GI.   Relevant Medications   rifaximin (XIFAXAN) 550 MG TABS tablet   Other Relevant Orders   CALPROTECTIN   Ova and parasite examination   Salmonella/Shigella  Cult, Campy EIA and Shiga Toxin reflex   Stool Giardia/Cryptosporidium   Ambulatory referral to Gastroenterology        Follow up plan: Return if symptoms worsen or fail to improve.
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
## 2021-10-25 NOTE — Assessment & Plan Note (Addendum)
#   HIGH RISK of BREAST CANCER- CHEK2 moderate risk mutation-I discussed with the patient that she is at increased risk of breast cancer but lower than that of typical CHEK2 mutations.  However as per Harriett Rush risk model score shows a 19.7% breast risk.   # At length I discussed the role of tamoxifen in risk reduction of development of breast cancer.  I reviewed the data of using tamoxifen 20 mg a day for 5 years.  Studies have shown that tamoxifen reduces the risk of development of breast cancer by approximately 50%. Long discussion regarding the potential adverse events on tamoxifen including but not limited to hot flashes, mood swings, thromboembolic events strokes and also small risk of uterine cancers.  After the lengthy discussion patient states that she she needed time to decide.  She will let us know of her decision.  # I had a long discussion with the patient regarding incorporation of healthy lifestyle which includes #moderation of alcohol #abstinence of smoking #maintaining healthy BMI#exercise 150-300 minutes of moderate intensity exercise/week.   Thank you Ms.Cowans  for allowing me to participate in the care of your pleasant patient. Please do not hesitate to contact me with questions or concerns in the interim.  # DISPOSITION: # Bil mammo/US # no labs today # follow up in 3 months- MD: no labs- Dr.B

## 2021-10-28 ENCOUNTER — Other Ambulatory Visit: Payer: Self-pay | Admitting: Nurse Practitioner

## 2021-10-28 ENCOUNTER — Telehealth: Payer: Self-pay

## 2021-10-28 ENCOUNTER — Encounter: Payer: Self-pay | Admitting: Internal Medicine

## 2021-10-28 ENCOUNTER — Encounter: Payer: Self-pay | Admitting: Nurse Practitioner

## 2021-10-28 DIAGNOSIS — R109 Unspecified abdominal pain: Secondary | ICD-10-CM | POA: Diagnosis not present

## 2021-10-28 DIAGNOSIS — R197 Diarrhea, unspecified: Secondary | ICD-10-CM

## 2021-10-28 MED ORDER — AMITRIPTYLINE HCL 10 MG PO TABS: 10.0000 mg | ORAL_TABLET | Freq: Every day | ORAL | 2 refills | Status: DC

## 2021-10-28 NOTE — Telephone Encounter (Signed)
Patient notified

## 2021-11-02 LAB — CALPROTECTIN: Calprotectin: 41 mcg/g

## 2021-11-04 ENCOUNTER — Other Ambulatory Visit: Payer: Self-pay | Admitting: Nurse Practitioner

## 2021-11-04 NOTE — Telephone Encounter (Signed)
Medication Refill - Medication: albuterol (VENTOLIN HFA) 108 (90 Base) MCG/ACT inhaler  Has the patient contacted their pharmacy? Yes.   (Agent: If no, request that the patient contact the pharmacy for the refill. If patient does not wish to contact the pharmacy document the reason why and proceed with request.) (Agent: If yes, when and what did the pharmacy advise?)  Preferred Pharmacy (with phone number or street name):  [id number] - [place], [place] Phone:  [phone]  Fax:  [phone]     Has the patient been seen for an appointment in the last year OR does the patient have an upcoming appointment? Yes.   Pt just saw Almyra Free last wk, needs inhaler refills called in by her new PCP  Agent: Please be advised that RX refills may take up to 3 business days. We ask that you follow-up with your pharmacy.

## 2021-11-04 NOTE — Progress Notes (Signed)
Left message for patient

## 2021-11-05 MED ORDER — ALBUTEROL SULFATE HFA 108 (90 BASE) MCG/ACT IN AERS: 2.0000 | INHALATION_SPRAY | RESPIRATORY_TRACT | 3 refills | Status: DC | PRN

## 2021-11-05 NOTE — Telephone Encounter (Signed)
Requested Prescriptions  Pending Prescriptions Disp Refills  . albuterol (VENTOLIN HFA) 108 (90 Base) MCG/ACT inhaler 1 each 3    Sig: Inhale 2 puffs into the lungs every 4 (four) hours as needed for wheezing or shortness of breath.     Pulmonology:  Beta Agonists 2 Passed - 11/04/2021  4:37 PM      Passed - Last BP in normal range    BP Readings from Last 1 Encounters:  10/25/21 118/76         Passed - Last Heart Rate in normal range    Pulse Readings from Last 1 Encounters:  10/25/21 93         Passed - Valid encounter within last 12 months    Recent Outpatient Visits          1 week ago Abdominal pain, unspecified abdominal location   [place], FNP   3 weeks ago Abdominal pain, unspecified abdominal location   [facility] Delsa Grana, PA-C   2 months ago Insomnia, unspecified type   [facility] Delsa Grana, PA-C   6 months ago [place], [place], DO   1 year ago No-show for appointment   [facility] Myles Gip, DO

## 2021-11-06 LAB — TEST AUTHORIZATION: TEST CODE:: 39441

## 2021-11-06 LAB — SALMONELLA/SHIGELLA CULT, CAMPY EIA AND SHIGA TOXIN RFL ECOLI
MICRO NUMBER: 14055502
MICRO NUMBER:: 14055504
MICRO NUMBER:: 14055507
Result:: NOT DETECTED
SHIGA RESULT:: NOT DETECTED
SPECIMEN QUALITY: ADEQUATE
SPECIMEN QUALITY:: ADEQUATE
SPECIMEN QUALITY:: ADEQUATE

## 2021-11-06 LAB — GIARDIA AND CRYPTOSPORIDIUM ANTIGEN PANEL
MICRO NUMBER:: 14055505
RESULT:: NOT DETECTED
SPECIMEN QUALITY:: ADEQUATE
Specimen Quality:: ADEQUATE
micro Number:: 14055503

## 2021-11-06 LAB — OVA AND PARASITE EXAMINATION
CONCENTRATE RESULT:: NONE SEEN
MICRO NUMBER:: 14055506
SPECIMEN QUALITY:: ADEQUATE
TRICHROME RESULT:: NONE SEEN

## 2021-11-12 ENCOUNTER — Ambulatory Visit
Admission: RE | Admit: 2021-11-12 | Discharge: 2021-11-12 | Disposition: A | Discharge status: Home / Self Care | Payer: BC Managed Care – PPO | Source: Ambulatory Visit | Attending: Internal Medicine | Admitting: Internal Medicine

## 2021-11-12 DIAGNOSIS — R92323 Mammographic fibroglandular density, bilateral breasts: Secondary | ICD-10-CM | POA: Insufficient documentation

## 2021-11-12 DIAGNOSIS — Z1239 Encounter for other screening for malignant neoplasm of breast: Secondary | ICD-10-CM | POA: Diagnosis not present

## 2021-11-12 DIAGNOSIS — Z1501 Genetic susceptibility to malignant neoplasm of breast: Secondary | ICD-10-CM | POA: Diagnosis not present

## 2021-11-12 DIAGNOSIS — N644 Mastodynia: Secondary | ICD-10-CM | POA: Insufficient documentation

## 2021-11-12 DIAGNOSIS — Z1589 Genetic susceptibility to other disease: Secondary | ICD-10-CM | POA: Insufficient documentation

## 2021-11-12 DIAGNOSIS — Z1502 Genetic susceptibility to malignant neoplasm of ovary: Secondary | ICD-10-CM | POA: Diagnosis not present

## 2021-11-12 DIAGNOSIS — Z1509 Genetic susceptibility to other malignant neoplasm: Secondary | ICD-10-CM | POA: Diagnosis not present

## 2021-11-12 DIAGNOSIS — Z803 Family history of malignant neoplasm of breast: Secondary | ICD-10-CM | POA: Diagnosis not present

## 2021-11-21 ENCOUNTER — Other Ambulatory Visit: Payer: Self-pay | Admitting: Nurse Practitioner

## 2021-11-21 DIAGNOSIS — R109 Unspecified abdominal pain: Secondary | ICD-10-CM

## 2021-11-21 DIAGNOSIS — R197 Diarrhea, unspecified: Secondary | ICD-10-CM

## 2021-11-21 NOTE — Telephone Encounter (Signed)
Requested Prescriptions  Pending Prescriptions Disp Refills   amitriptyline (ELAVIL) 10 MG tablet [Pharmacy Med Name: AMITRIPTYLINE HCL 10 MG TAB] 90 tablet 1    Sig: TAKE 1 TABLET BY MOUTH EVERYDAY AT BEDTIME     Psychiatry:  Antidepressants - Heterocyclics (TCAs) Passed - 11/21/2021 12:33 PM      Passed - Valid encounter within last 6 months    Recent Outpatient Visits           3 weeks ago Abdominal pain, unspecified abdominal location   [facility] Berniece Salines, FNP   1 month ago Abdominal pain, unspecified abdominal location   [facility] [name], PA-C   2 months ago Insomnia, unspecified type   [facility] [name], PA-C   7 months ago Anxiety   [facility] Caro Laroche, DO   1 year ago No-show for appointment   [facility] Caro Laroche, DO

## 2021-11-25 ENCOUNTER — Other Ambulatory Visit: Payer: Self-pay | Admitting: Nurse Practitioner

## 2021-11-25 DIAGNOSIS — Z9109 Other allergy status, other than to drugs and biological substances: Secondary | ICD-10-CM

## 2021-11-25 DIAGNOSIS — J4541 Moderate persistent asthma with (acute) exacerbation: Secondary | ICD-10-CM

## 2021-11-25 MED ORDER — ALBUTEROL SULFATE HFA 108 (90 BASE) MCG/ACT IN AERS: 2.0000 | INHALATION_SPRAY | RESPIRATORY_TRACT | 3 refills | Status: AC | PRN

## 2021-11-25 NOTE — Progress Notes (Signed)
Patient sent a message stating that she thinks she is allergic to cockroaches and would like to get allergy testing done.  Referral placed for allergy testing.

## 2021-11-28 ENCOUNTER — Ambulatory Visit: Payer: Medicaid Other | Admitting: Family Medicine

## 2021-12-10 ENCOUNTER — Telehealth: Payer: Self-pay | Admitting: *Deleted

## 2021-12-10 NOTE — Telephone Encounter (Signed)
Form completed and sent for physician signature. Patient states she will pick form up when completed

## 2021-12-10 NOTE — Telephone Encounter (Signed)
Steward Drone did you see the pt advice request from 10/16 regarding form?

## 2021-12-10 NOTE — Telephone Encounter (Signed)
Patient dropped off a disability claim form to the office for Dr B to complete and sign. There is nothing in his note to say that he put her out of work or that she is disabled. Is she disabled due to her Gene Mutation of Chek 2 and are you willing to sign this claim form?

## 2021-12-11 NOTE — Telephone Encounter (Signed)
Form completed signed and copied for chart. Patient notified that form is ready to be picked up
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
## 2022-01-22 ENCOUNTER — Telehealth: Payer: Self-pay | Admitting: *Deleted

## 2022-01-22 NOTE — Telephone Encounter (Signed)
Pt's mother calling, on DPR. Pt is not present. Mother states pt had panic attack at workplace. States workplace has EMS on premises, called to assess pt. States EMS did not transport, advised to have her picked up, "Someone is bringing her home."  Calling for appt. Advised to have pt call when returns home for triage and full assessment. Mother verbalizes understanding and states will have pt call back.

## 2022-01-23 ENCOUNTER — Encounter: Payer: Self-pay | Admitting: Nurse Practitioner

## 2022-01-23 ENCOUNTER — Ambulatory Visit (INDEPENDENT_AMBULATORY_CARE_PROVIDER_SITE_OTHER): Payer: BC Managed Care – PPO | Admitting: Nurse Practitioner

## 2022-01-23 ENCOUNTER — Other Ambulatory Visit: Payer: Self-pay

## 2022-01-23 VITALS — BP 112/76 | HR 100 | Temp 98.4°F | Resp 16 | Ht 62.0 in | Wt 154.6 lb

## 2022-01-23 DIAGNOSIS — F419 Anxiety disorder, unspecified: Secondary | ICD-10-CM

## 2022-01-23 DIAGNOSIS — F332 Major depressive disorder, recurrent severe without psychotic features: Secondary | ICD-10-CM

## 2022-01-23 MED ORDER — ARIPIPRAZOLE 2 MG PO TABS: 2.0000 mg | ORAL_TABLET | Freq: Every day | ORAL | 0 refills | Status: AC

## 2022-01-23 NOTE — Assessment & Plan Note (Signed)
Continue buspar 10 mg BID, lexapro 10 mg daily, and hydroxyzine 25 mg at bedtime.  Start Abilify 2 mg daily.  Follow up with psychiatry, referral placed.  

## 2022-01-23 NOTE — Progress Notes (Signed)
BP 112/76   Pulse 100   Temp 98.4 F (36.9 C) (Oral)   Resp 16   Ht 5\' 2"  (1.575 m)   Wt 154 lb 9.6 oz (70.1 kg)   SpO2 99%   BMI 28.28 kg/m    Subjective:    Patient ID: Julia Cherry, female    DOB: 13-Jul-1990, 32 y.o.   MRN: [id number]  HPI: Julia Cherry is a 32 y.o. female  Chief Complaint  Patient presents with   Panic Attack    Follow up   Anxiety/depression: patient PHQ9 and GAD scores have increased significantly. She is currently taking lexapro 10 mg daily , hydroxyzine 25 mg every 8 hours and buspar 10 mg two times a day.  She did do counseling but with insurance she now needs to find a new therapist.  She says she is currently taking her medication as prescribed but her anxiety has increased significantly.   She says that her Dad is ill and he is currently passing away,  she says she is not okay but still needs to be able to focus at work.  She says that the medication makes her feel zoned out and unable to do her work.  She says she has been on abilify before.  Will trial that and place referral to psychiatry.       01/23/2022    1:19 PM 10/25/2021    2:40 PM 08/28/2021    3:41 PM 04/24/2021    1:07 PM  GAD 7 : Generalized Anxiety Score  Nervous, Anxious, on Edge 3 2 1 2   Control/stop worrying 3 2 1 3   Worry too much - different things 2 2 1 3   Trouble relaxing 2 2 1 2   Restless 2 2 0 2  Easily annoyed or irritable 3 2 0 3  Afraid - awful might happen 2 2 0 1  Total GAD 7 Score 17 14 4 16   Anxiety Difficulty Somewhat difficult Somewhat difficult Somewhat difficult         01/23/2022    1:18 PM 10/25/2021    2:39 PM 10/15/2021    8:20 AM 08/28/2021    3:41 PM 04/24/2021    1:06 PM  Depression screen PHQ 2/9  Decreased Interest 2 0 0 2 3  Down, Depressed, Hopeless 2 0 0 2 3  PHQ - 2 Score 4 0 0 4 6  Altered sleeping 3 1 0 2 3  Tired, decreased energy 3 0 0 2 3  Change in appetite 2 1 0 0 3  Feeling bad or failure about yourself  2 0 0  0 1  Trouble concentrating 2 0 0 0 1  Moving slowly or fidgety/restless 3 0 0 0 0  Suicidal thoughts 0 0 0 0 0  PHQ-9 Score 19 2 0 8 17  Difficult doing work/chores Somewhat difficult Not difficult at all Not difficult at all Somewhat difficult     Relevant past medical, surgical, family and social history reviewed and updated as indicated. Interim medical history since our last visit reviewed. Allergies and medications reviewed and updated.  Review of Systems  Constitutional: Negative for fever or weight change.  Respiratory: Negative for cough and shortness of breath.   Cardiovascular: Negative for chest pain or palpitations.  Gastrointestinal: Negative for abdominal pain, no bowel changes.  Musculoskeletal: Negative for gait problem or joint swelling.  Skin: Negative for rash.  Neurological: Negative for dizziness or headache.  No other specific complaints in a  complete review of systems (except as listed in HPI above).      Objective:    BP 112/76   Pulse 100   Temp 98.4 F (36.9 C) (Oral)   Resp 16   Ht 5\' 2"  (1.575 m)   Wt 154 lb 9.6 oz (70.1 kg)   SpO2 99%   BMI 28.28 kg/m   Wt Readings from Last 3 Encounters:  01/23/22 154 lb 9.6 oz (70.1 kg)  10/25/21 154 lb 4.8 oz (70 kg)  10/25/21 154 lb (69.9 kg)    Physical Exam  Constitutional: Patient appears well-developed and well-nourished.  No distress.  HEENT: head atraumatic, normocephalic, pupils equal and reactive to light, neck supple Cardiovascular: Normal rate, regular rhythm and normal heart sounds.  No murmur heard. No BLE edema. Pulmonary/Chest: Effort normal and breath sounds normal. No respiratory distress. Abdominal: Soft.  There is no tenderness. Psychiatric: Patient has a depressed mood and affect. behavior is normal. Judgment and thought content normal.      Assessment & Plan:   Problem List Items Addressed This Visit       Other   Anxiety    Continue buspar 10 mg BID, lexapro 10 mg daily, and  hydroxyzine 25 mg at bedtime.  Start Abilify 2 mg daily.  Follow up with psychiatry, referral placed.       Relevant Medications   ARIPiprazole (ABILIFY) 2 MG tablet   Other Relevant Orders   Ambulatory referral to Psychiatry   Severe episode of recurrent major depressive disorder, without psychotic features ([facility]) - Primary    Continue buspar 10 mg BID, lexapro 10 mg daily, and hydroxyzine 25 mg at bedtime.  Start Abilify 2 mg daily.  Follow up with psychiatry, referral placed.       Relevant Medications   ARIPiprazole (ABILIFY) 2 MG tablet   Other Relevant Orders   Ambulatory referral to Psychiatry     Follow up plan: Return in about 4 weeks (around 02/20/2022) for follow up if not in with psyciatry .

## 2022-01-23 NOTE — Assessment & Plan Note (Signed)
Continue buspar 10 mg BID, lexapro 10 mg daily, and hydroxyzine 25 mg at bedtime.  Start Abilify 2 mg daily.  Follow up with psychiatry, referral placed.

## 2022-01-27 ENCOUNTER — Inpatient Hospital Stay: Payer: BC Managed Care – PPO | Attending: Oncology | Admitting: Internal Medicine

## 2022-01-27 VITALS — BP 123/74 | HR 79 | Temp 98.3°F | Resp 16 | Wt 151.1 lb

## 2022-01-27 DIAGNOSIS — I10 Essential (primary) hypertension: Secondary | ICD-10-CM | POA: Insufficient documentation

## 2022-01-27 DIAGNOSIS — Z1502 Genetic susceptibility to malignant neoplasm of ovary: Secondary | ICD-10-CM

## 2022-01-27 DIAGNOSIS — Z800 Family history of malignant neoplasm of digestive organs: Secondary | ICD-10-CM | POA: Insufficient documentation

## 2022-01-27 DIAGNOSIS — Z79890 Hormone replacement therapy (postmenopausal): Secondary | ICD-10-CM

## 2022-01-27 DIAGNOSIS — Z1589 Genetic susceptibility to other disease: Secondary | ICD-10-CM

## 2022-01-27 DIAGNOSIS — Z1501 Genetic susceptibility to malignant neoplasm of breast: Secondary | ICD-10-CM | POA: Insufficient documentation

## 2022-01-27 DIAGNOSIS — Z1509 Genetic susceptibility to other malignant neoplasm: Secondary | ICD-10-CM | POA: Diagnosis not present

## 2022-01-27 DIAGNOSIS — F419 Anxiety disorder, unspecified: Secondary | ICD-10-CM | POA: Diagnosis not present

## 2022-01-27 DIAGNOSIS — Z803 Family history of malignant neoplasm of breast: Secondary | ICD-10-CM | POA: Diagnosis not present

## 2022-01-27 MED ORDER — TAMOXIFEN CITRATE 20 MG PO TABS: 20.0000 mg | ORAL_TABLET | Freq: Every day | ORAL | 6 refills | Status: AC

## 2022-01-27 NOTE — Assessment & Plan Note (Addendum)
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  Patient agreement.  # I discussed the role of tamoxifen in risk reduction of development of breast cancer.  I reviewed the data of using tamoxifen 20 mg a day for 5 years.  Studies have shown that tamoxifen reduces the risk of development of breast cancer by approximately 50%. Long discussion regarding the potential adverse events on tamoxifen including but not limited to hot flashes, mood swings, thromboembolic events strokes and also small risk of uterine cancers. Proceed with tanoxifen today.   #I also reviewed cancer screening: a] colonoscopy at 43 years b] annual PAP smear-   # DISPOSITION: # follow up in 6 months- MD: labs- cbc/cmp-  Dr.B
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
## 2022-01-27 NOTE — Progress Notes (Signed)
Patient denies new problems/concerns today.    Increasing anxiety with step father currently in ICU.  Anxiety is being treated by outside MD.

## 2022-02-05 ENCOUNTER — Emergency Department
Admission: EM | Admit: 2022-02-05 | Discharge: 2022-02-05 | Disposition: A | Discharge status: Home / Self Care | Payer: Medicaid Other | Attending: Emergency Medicine | Admitting: Emergency Medicine

## 2022-02-05 ENCOUNTER — Other Ambulatory Visit: Payer: Self-pay

## 2022-02-05 DIAGNOSIS — F1210 Cannabis abuse, uncomplicated: Secondary | ICD-10-CM | POA: Diagnosis not present

## 2022-02-05 DIAGNOSIS — F419 Anxiety disorder, unspecified: Secondary | ICD-10-CM | POA: Diagnosis present

## 2022-02-05 DIAGNOSIS — J45909 Unspecified asthma, uncomplicated: Secondary | ICD-10-CM | POA: Insufficient documentation

## 2022-02-05 DIAGNOSIS — F332 Major depressive disorder, recurrent severe without psychotic features: Secondary | ICD-10-CM | POA: Diagnosis not present

## 2022-02-05 DIAGNOSIS — I10 Essential (primary) hypertension: Secondary | ICD-10-CM | POA: Insufficient documentation

## 2022-02-05 DIAGNOSIS — F411 Generalized anxiety disorder: Secondary | ICD-10-CM

## 2022-02-05 DIAGNOSIS — E876 Hypokalemia: Secondary | ICD-10-CM | POA: Insufficient documentation

## 2022-02-05 DIAGNOSIS — Z20822 Contact with and (suspected) exposure to covid-19: Secondary | ICD-10-CM | POA: Diagnosis not present

## 2022-02-05 DIAGNOSIS — Z79899 Other long term (current) drug therapy: Secondary | ICD-10-CM | POA: Insufficient documentation

## 2022-02-05 DIAGNOSIS — Z7951 Long term (current) use of inhaled steroids: Secondary | ICD-10-CM | POA: Insufficient documentation

## 2022-02-05 LAB — CBC WITH DIFFERENTIAL/PLATELET
Abs Immature Granulocytes: 0.02 10*3/uL (ref 0.00–0.07)
Basophils Absolute: 0 10*3/uL (ref 0.0–0.1)
Basophils Relative: 1 %
Eosinophils Absolute: 0.2 10*3/uL (ref 0.0–0.5)
Eosinophils Relative: 2 %
HCT: 40.9 % (ref 36.0–46.0)
Hemoglobin: 13.2 g/dL (ref 12.0–15.0)
Immature Granulocytes: 0 %
Lymphocytes Relative: 26 %
Lymphs Abs: 2.1 10*3/uL (ref 0.7–4.0)
MCH: 28.5 pg (ref 26.0–34.0)
MCHC: 32.3 g/dL (ref 30.0–36.0)
MCV: 88.3 fL (ref 80.0–100.0)
Monocytes Absolute: 0.6 10*3/uL (ref 0.1–1.0)
Monocytes Relative: 7 %
Neutro Abs: 5.2 10*3/uL (ref 1.7–7.7)
Neutrophils Relative %: 64 %
Platelets: 285 10*3/uL (ref 150–400)
RBC: 4.63 MIL/uL (ref 3.87–5.11)
RDW: 12.9 % (ref 11.5–15.5)
WBC: 8.1 10*3/uL (ref 4.0–10.5)
nRBC: 0 % (ref 0.0–0.2)

## 2022-02-05 LAB — URINALYSIS, ROUTINE W REFLEX MICROSCOPIC
Bilirubin Urine: NEGATIVE
Glucose, UA: NEGATIVE mg/dL
Hgb urine dipstick: NEGATIVE
Ketones, ur: NEGATIVE mg/dL
Leukocytes,Ua: NEGATIVE
Nitrite: NEGATIVE
Protein, ur: NEGATIVE mg/dL
Specific Gravity, Urine: 1.028 (ref 1.005–1.030)
pH: 5 (ref 5.0–8.0)

## 2022-02-05 LAB — BASIC METABOLIC PANEL
Anion gap: 10 (ref 5–15)
BUN: 15 mg/dL (ref 6–20)
CO2: 23 mmol/L (ref 22–32)
Calcium: 9 mg/dL (ref 8.9–10.3)
Chloride: 104 mmol/L (ref 98–111)
Creatinine, Ser: 0.73 mg/dL (ref 0.44–1.00)
GFR, Estimated: >60 mL/min (ref >60)
Glucose, Bld: 92 mg/dL (ref 70–99)
Potassium: 3.2 mmol/L — ABNORMAL LOW (ref 3.5–5.1)
Sodium: 137 mmol/L (ref 135–145)

## 2022-02-05 LAB — RESP PANEL BY RT-PCR (RSV, FLU A&B, COVID)  RVPGX2
Influenza A by PCR: NEGATIVE
Influenza B by PCR: NEGATIVE
Resp Syncytial Virus by PCR: NEGATIVE
SARS Coronavirus 2 by RT PCR: NEGATIVE

## 2022-02-05 LAB — URINE DRUG SCREEN, QUALITATIVE (ARMC ONLY)
Amphetamines, Ur Screen: NOT DETECTED
Barbiturates, Ur Screen: NOT DETECTED
Benzodiazepine, Ur Scrn: NOT DETECTED
Cannabinoid 50 Ng, Ur ~~LOC~~: POSITIVE — AB
Cocaine Metabolite,Ur ~~LOC~~: NOT DETECTED
MDMA (Ecstasy)Ur Screen: NOT DETECTED
Methadone Scn, Ur: NOT DETECTED
Opiate, Ur Screen: NOT DETECTED
Phencyclidine (PCP) Ur S: NOT DETECTED
Tricyclic, Ur Screen: NOT DETECTED

## 2022-02-05 LAB — TROPONIN I (HIGH SENSITIVITY)
Troponin I (High Sensitivity): <2 ng/L (ref <18)
Troponin I (High Sensitivity): <2 ng/L (ref <18)

## 2022-02-05 LAB — POC URINE PREG, ED: Preg Test, Ur: NEGATIVE

## 2022-02-05 MED ORDER — HYDROXYZINE HCL 25 MG PO TABS: 25.0000 mg | ORAL_TABLET | Freq: Three times a day (TID) | ORAL | Status: DC | PRN

## 2022-02-05 MED ORDER — POTASSIUM CHLORIDE CRYS ER 20 MEQ PO TBCR
40.0000 meq | EXTENDED_RELEASE_TABLET | Freq: Once | ORAL | Status: AC
  Administered 2022-02-05: 40 meq via ORAL
  Filled 2022-02-05: qty 2

## 2022-02-05 MED ORDER — BUSPIRONE HCL 5 MG PO TABS: 10.0000 mg | ORAL_TABLET | Freq: Two times a day (BID) | ORAL | Status: DC
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
## 2022-02-05 NOTE — ED Notes (Signed)
Patient states she wishes to speak with psych about medications and then wishes to go home.  TTS called and notified.

## 2022-02-05 NOTE — ED Notes (Signed)
Lunch tray provided. 

## 2022-02-05 NOTE — ED Provider Notes (Signed)
Patient seen by Psych again and is now requesting D/C. She does not meet any IVC criteria and has contracted for safety. D/c with outpt follow-up.   Duffy Bruce, MD 02/05/22 [phone]

## 2022-02-05 NOTE — ED Notes (Signed)
Dinner tray given to patient

## 2022-02-05 NOTE — ED Triage Notes (Signed)
Pt here with an anxiety attack. Pt is on 5 different medications and feels like "the world is crashing around her". Pt states some cp but only due to her anxiety attacks.

## 2022-02-05 NOTE — ED Provider Notes (Signed)
[facility] Provider Note    Event Date/Time   First MD Initiated Contact with Patient 02/05/22 1114     (approximate)   History   Anxiety   HPI  Julia Cherry is a 32 y.o. female reports a history of anxiety as well as asthma  She reports that she has had for several days intermittent severe panic attacks.  She is taking multiple psychiatric medications, talk with her counselor and her psychiatrist today about the severity of her anxiety and they referred her to the ER to be seen by psychiatrist.  Denies wanting to harm herself or anyone else.  Denies any confusion.  She reports that she will have a sense of tightness in her chest when the panic episodes occur, but has suffered with this for years and years and is quite familiar with her symptoms.  Denies any history of heart disease.  Does have a history of elevated blood pressures.  Currently reports no pain or discomfort.  She reports she will start to feel anxious then she has a tight feeling in her chest.  No leg swelling.  No history of blood clots.  No long trips or travels.  Does not take any estrogens.  No coughing.     Physical Exam   Triage Vital Signs: ED Triage Vitals  Enc Vitals Group     BP 02/05/22 1035 (!) 152/82     Pulse Rate 02/05/22 1035 95     Resp 02/05/22 1035 18     Temp 02/05/22 1036 98.7 F (37.1 C)     Temp Source 02/05/22 1036 Oral     SpO2 02/05/22 1035 100 %     Weight 02/05/22 1035 151 lb 0.2 oz (68.5 kg)     Height 02/05/22 1035 5\' 2"  (1.575 m)     Head Circumference --      Peak Flow --      Pain Score 02/05/22 1035 7     Pain Loc --      Pain Edu? --      Excl. in [place]? --     Most recent vital signs: Vitals:   02/05/22 1035 02/05/22 1036  BP: (!) 152/82   Pulse: 95   Resp: 18   Temp:  98.7 F (37.1 C)  SpO2: 100%      General: Awake, no distress.  Slightly tearful reports feeling anxious.  Denies desire to harm herself or anyone else.   She is alert well-oriented no confusion CV:  Good peripheral perfusion.  Clear lungs bilaterally normal heart tones and rate.  Currently not having any chest pain or tightness.  Rate reports this happens when she is having her episodes of panic Resp:  Normal effort.  Clear bilateral Abd:  No distention.  Does not appear gravid, patient denies pregnancy Other:  No bilateral leg swelling.   ED Results / Procedures / Treatments   Labs (all labs ordered are listed, but only abnormal results are displayed) Labs Reviewed  BASIC METABOLIC PANEL - Abnormal; Notable for the following components:      Result Value   Potassium 3.2 (*)    All other components within normal limits  URINALYSIS, ROUTINE W REFLEX MICROSCOPIC - Abnormal; Notable for the following components:   Color, Urine YELLOW (*)    APPearance CLEAR (*)    All other components within normal limits  URINE DRUG SCREEN, QUALITATIVE ([facility] ONLY) - Abnormal; Notable for the following components:   Cannabinoid 50  Ng, Ur [place] POSITIVE (*)    All other components within normal limits  CBC WITH DIFFERENTIAL/PLATELET  POC URINE PREG, ED  POC URINE PREG, ED  TROPONIN I (HIGH SENSITIVITY)  TROPONIN I (HIGH SENSITIVITY)     EKG  A interpreted by me at 1042 heart rate 99 QRS 96 QTc 420 Sinus rhythm, mild nonspecific T wave abnormality, suspicious for possible underlying LVH with repolarization abnormality.  Morphology appears similar to previous EKG from January 09, 2020 though her T waves are more flattened today.  Troponin #1 interpreted as normal #2  Normal   RADIOLOGY No indication for imaging.  Normal oxygen saturation clear lungs bilaterally.  Currently asymptomatic.  Doubt acute infectious process, no clinical signs or symptoms suggest pulmonary embolism, DVT PE pneumothorax volume overload, space-occupying chest lesion etc.   PROCEDURES:  Critical Care performed: No  Procedures   MEDICATIONS ORDERED IN ED: Medications   potassium chloride SA (KLOR-CON M) CR tablet 40 mEq (40 mEq Oral Given 02/05/22 1141)     IMPRESSION / MDM / ASSESSMENT AND PLAN / ED COURSE  I reviewed the triage vital signs and the nursing notes.                              Differential diagnosis includes, but is not limited to, likely exacerbation of underlying panic disorder as the patient describes it.  She has reassuring cardiac and pulmonary evaluation.  She is in no acute distress, but does certainly have a prolonged history of severe anxiety that she reports starting as a very young child.  She is quite familiar with her symptoms.  She was referred here by her counselor and psychiatrist for psychiatric assessment.  She does not meet any criteria that would be suggestive of need for involuntary commitment during my interview with her at this time.  Patient's presentation is most consistent with acute complicated illness / injury requiring diagnostic workup.  CBC and metabolic panel are normal.  Exception being some very mild hypokalemia      Pulmonary Embolism Rule-out Criteria (PERC rule)                        If YES to ANY of the following, the PERC rule is not satisfied and cannot be used to rule out PE in this patient (consider d-dimer or imaging depending on pre-test probability).                      If NO to ALL of the following, AND the clinician's pre-test probability is <15%, the [facility] rule is satisfied and there is no need for further workup (including no need to obtain a d-dimer) as the post-test probability of pulmonary embolism is <2%.                      Mnemonic is HAD CLOTS   H - hormone use (exogenous estrogen)      No. A - age > 50                                                 No. D - DVT/PE history  No.   C - coughing blood (hemoptysis)                 No. L - leg swelling, unilateral                             No. O - O2 Sat on Room Air < 95%                   No. T - tachycardia (HR ? 100)                         No. S - surgery or trauma, recent                      No.   Based on my evaluation of the patient, including application of this decision instrument, further testing to evaluate for pulmonary embolism is not indicated at this time.   Labs reviewed.  Notable for mild hypokalemia which is repleted.  CBC and metabolic panel are normal.  Troponin normal x 2 along with a nonischemic EKG.  Mild nonspecific T wave abnormalities he is currently chest pain-free and her symptomatology that she describes certainly seems more in keeping with severe anxiety and panic attack than with any type of ACS.    The patient has been placed in psychiatric observation due to the need to provide a safe environment for the patient while obtaining psychiatric consultation and evaluation, as well as ongoing medical and medication management to treat the patient's condition.  The patient has not been placed under full IVC at this time.  ----------------------------------------- 1:41 PM on 02/05/2022 ----------------------------------------- Patient's resting comfortably, medically cleared for psychiatric consult at this time.  FINAL CLINICAL IMPRESSION(S) / ED DIAGNOSES   Final diagnoses:  Anxiety  Anxiety state     Rx / DC Orders   ED Discharge Orders     None        Note:  This document was prepared using Dragon voice recognition software and may include unintentional dictation errors.   Delman Kitten, MD 02/05/22 1344

## 2022-02-05 NOTE — ED Notes (Signed)
Patient belongings as follow:  A pair of black crocs A pair of black sock A pair of black sweatpants A white t-shirt A black jacket A set of AirPods A black cell phone A purple pen A white toboggan A green and yellow pair of boxer short underwear .
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
## 2022-02-05 NOTE — ED Provider Triage Note (Signed)
Emergency Medicine Provider Triage Evaluation Note  Julia Cherry , a 32 y.o. female  was evaluated in triage.  Pt complains of anxiety attacks. No SI/HI. Reports her meds arent working. On buspar, lexapro, hydroxyzine, abilify (just started 01/23/22). Sent for psychiatry referral. Reports chest pain which he attributes to anxiety. Denies substance use.  Review of Systems  Positive: anxiety Negative: SI/HI  Physical Exam  There were no vitals taken for this visit. Gen:   Awake, no distress   Resp:  Normal effort  MSK:   Moves extremities without difficulty  Other:    Medical Decision Making  Medically screening exam initiated at 10:34 AM.  Appropriate orders placed.  Julia Cherry was informed that the remainder of the evaluation will be completed by another provider, this initial triage assessment does not replace that evaluation, and the importance of remaining in the ED until their evaluation is complete.     Marquette Old, PA-C 02/05/22 1037

## 2022-02-05 NOTE — ED Notes (Signed)
AAOx3.  Skin warm and dry. Patient spoke with psych.  New orders entered. Patient declines offer for prn Hydroxyzine at this time.
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
## 2022-02-05 NOTE — ED Provider Notes (Signed)
Ongoing care and disposition assigned to Dr. Bland Span.  Patient is currently planned for psychiatric inpatient admission.   Julia Kitten, MD 02/05/22 [phone]

## 2022-02-06 ENCOUNTER — Other Ambulatory Visit: Payer: Self-pay | Admitting: Nurse Practitioner

## 2022-02-06 DIAGNOSIS — F332 Major depressive disorder, recurrent severe without psychotic features: Secondary | ICD-10-CM

## 2022-02-06 DIAGNOSIS — F419 Anxiety disorder, unspecified: Secondary | ICD-10-CM

## 2022-02-07 NOTE — Telephone Encounter (Signed)
Requested medications are due for refill today.  unsure  Requested medications are on the active medications list.  yes  Last refill. 01/23/2022 #30 0 rf  Future visit scheduled.   no  Notes to clinic.  Refill not delegated.    Requested Prescriptions  Pending Prescriptions Disp Refills   ARIPiprazole (ABILIFY) 2 MG tablet [Pharmacy Med Name: ARIPIPRAZOLE 2 MG TABLET] 90 tablet 1    Sig: TAKE 1 TABLET BY MOUTH EVERY DAY     Not Delegated - Psychiatry:  Antipsychotics - Second Generation (Atypical) - aripiprazole Failed - 02/06/2022  9:17 PM      Failed - This refill cannot be delegated      Failed - Last BP in normal range    BP Readings from Last 1 Encounters:  02/05/22 (!) 148/78         Failed - Lipid Panel in normal range within the last 12 months    No results found for: "CHOL", "POCCHOL", "CHOLTOT" No results found for: "LDLCALC", "LDLC", "HIRISKLDL", "POCLDL", "LDLDIRECT", "REALLDLC", "TOTLDLC" No results found for: "HDL", "POCHDL" No results found for: "TRIG", "POCTRIG"       Passed - TSH in normal range and within 360 days    TSH  Date Value Ref Range Status  10/15/2021 1.71 mIU/L Final    Comment:              Reference Range .           > or = 20 Years  0.40-4.50 .                Pregnancy Ranges           First trimester    0.26-2.66           Second trimester   0.55-2.73           Third trimester    0.43-2.91          Passed - Completed PHQ-2 or PHQ-9 in the last 360 days      Passed - Last Heart Rate in normal range    Pulse Readings from Last 1 Encounters:  02/05/22 88         Passed - Valid encounter within last 6 months    Recent Outpatient Visits           2 weeks ago Severe episode of recurrent major depressive disorder, without psychotic features [facility])   [facility] Della Goo F, FNP   3 months ago Abdominal pain, unspecified abdominal location   [facility] Berniece Salines, FNP    3 months ago Abdominal pain, unspecified abdominal location   [facility] [name], PA-C   5 months ago Insomnia, unspecified type   [facility] [name], PA-C   9 months ago Anxiety   [facility] [place] D[place] M, DO              Passed - CBC within normal limits and completed in the last 12 months    WBC  Date Value Ref Range Status  02/05/2022 8.1 4.0 - 10.5 K/uL Final   RBC  Date Value Ref Range Status  02/05/2022 4.63 3.87 - 5.11 MIL/uL Final   Hemoglobin  Date Value Ref Range Status  02/05/2022 13.2 12.0 - 15.0 g/dL Final   HGB  Date Value Ref Range Status  04/21/2014 12.6 12.0 - 16.0 g/dL Final  HCT  Date Value Ref Range Status  02/05/2022 40.9 36.0 - 46.0 % Final  04/21/2014 37.9 35.0 - 47.0 % Final   MCHC  Date Value Ref Range Status  02/05/2022 32.3 30.0 - 36.0 g/dL Final   [facility]  Date Value Ref Range Status  02/05/2022 28.5 26.0 - 34.0 pg Final   MCV  Date Value Ref Range Status  02/05/2022 88.3 80.0 - 100.0 fL Final  04/21/2014 87 80 - 100 fL Final   No results found for: "PLTCOUNTKUC", "LABPLAT", "POCPLA" RDW  Date Value Ref Range Status  02/05/2022 12.9 11.5 - 15.5 % Final  04/21/2014 13.4 11.5 - 14.5 % Final         Passed - CMP within normal limits and completed in the last 12 months    Albumin  Date Value Ref Range Status  10/18/2021 3.9 3.5 - 5.0 g/dL Final  04/21/2014 3.6 g/dL Final    Comment:    3.5-5.0 NOTE: New reference range  03/21/14    Alkaline Phosphatase  Date Value Ref Range Status  10/18/2021 49 38 - 126 U/L Final  04/21/2014 48 U/L Final    Comment:    38-126 NOTE: New Reference Range  03/21/14    Alkaline phosphatase (APISO)  Date Value Ref Range Status  10/15/2021 58 31 - 125 U/L Final   ALT  Date Value Ref Range Status  10/18/2021 15 0 - 44 U/L Final   SGPT (ALT)  Date Value Ref Range Status  04/21/2014 14  U/L Final    Comment:    14-54 NOTE: New Reference Range  03/21/14    AST  Date Value Ref Range Status  10/18/2021 16 15 - 41 U/L Final   SGOT(AST)  Date Value Ref Range Status  04/21/2014 17 U/L Final    Comment:    15-41 NOTE: New Reference Range  03/21/14    BUN  Date Value Ref Range Status  02/05/2022 15 6 - 20 mg/dL Final  04/21/2014 14 mg/dL Final    Comment:    6-20 NOTE: New Reference Range  03/21/14    Calcium  Date Value Ref Range Status  02/05/2022 9.0 8.9 - 10.3 mg/dL Final   Calcium, Total  Date Value Ref Range Status  04/21/2014 8.9 mg/dL Final    Comment:    8.9-10.3 NOTE: New Reference Range  03/21/14    CO2  Date Value Ref Range Status  02/05/2022 23 22 - 32 mmol/L Final   Co2  Date Value Ref Range Status  04/21/2014 26 mmol/L Final    Comment:    22-32 NOTE: New Reference Range  03/21/14    Bicarbonate  Date Value Ref Range Status  04/11/2015 21.2 21.0 - 28.0 mEq/L Final   Creat  Date Value Ref Range Status  10/15/2021 0.75 0.50 - 0.97 mg/dL Final   Creatinine, Ser  Date Value Ref Range Status  02/05/2022 0.73 0.44 - 1.00 mg/dL Final   Glucose  Date Value Ref Range Status  04/21/2014 102 (H) mg/dL Final    Comment:    65-99 NOTE: New Reference Range  03/21/14    Glucose, Bld  Date Value Ref Range Status  02/05/2022 92 70 - 99 mg/dL Final    Comment:    Glucose reference range applies only to samples taken after fasting for at least 8 hours.   Glucose-Capillary  Date Value Ref Range Status  12/27/2014 82 65 - 99 mg/dL Final   Potassium  Date Value Ref  Range Status  02/05/2022 3.2 (L) 3.5 - 5.1 mmol/L Final  04/21/2014 3.4 (L) mmol/L Final    Comment:    3.5-5.1 NOTE: New Reference Range  03/21/14    Sodium  Date Value Ref Range Status  02/05/2022 137 135 - 145 mmol/L Final  04/21/2014 137 mmol/L Final    Comment:    135-145 NOTE: New Reference Range  03/21/14    Total Bilirubin  Date Value Ref  Range Status  10/18/2021 0.5 0.3 - 1.2 mg/dL Final   Bilirubin,Total  Date Value Ref Range Status  04/21/2014 0.8 mg/dL Final    Comment:    0.3-1.2 NOTE: New Reference Range  03/21/14    Protein, ur  Date Value Ref Range Status  02/05/2022 NEGATIVE NEGATIVE mg/dL Final   Total Protein  Date Value Ref Range Status  10/18/2021 7.8 6.5 - 8.1 g/dL Final  04/21/2014 7.1 g/dL Final    Comment:    6.5-8.1 NOTE: New Reference Range  03/21/14    EGFR (African American)  Date Value Ref Range Status  04/21/2014 >60  Final   GFR calc Af Amer  Date Value Ref Range Status  08/01/2019 >60 >60 mL/min Final   eGFR  Date Value Ref Range Status  10/15/2021 108 > OR = 60 mL/min/1.53m2 Final   EGFR (Non-African Amer.)  Date Value Ref Range Status  04/21/2014 >60  Final    Comment:    eGFR values <60mL/min/1.73 m2 may be an indication of chronic kidney disease (CKD). Calculated eGFR is useful in patients with stable renal function. The eGFR calculation will not be reliable in acutely ill patients when serum creatinine is changing rapidly. It is not useful in patients on dialysis. The eGFR calculation may not be applicable to patients at the low and high extremes of body sizes, pregnant women, and vegetarians.    GFR, Estimated  Date Value Ref Range Status  02/05/2022 >60 >60 mL/min Final    Comment:    (NOTE) Calculated using the CKD-EPI Creatinine Equation (2021)

## 2022-02-11 ENCOUNTER — Other Ambulatory Visit: Payer: Self-pay

## 2022-02-24 ENCOUNTER — Other Ambulatory Visit: Payer: Self-pay

## 2022-02-24 ENCOUNTER — Encounter: Payer: Self-pay | Admitting: Emergency Medicine

## 2022-02-24 ENCOUNTER — Emergency Department
Admission: EM | Admit: 2022-02-24 | Discharge: 2022-02-24 | Disposition: A | Discharge status: Home / Self Care | Payer: BC Managed Care – PPO | Attending: Emergency Medicine | Admitting: Emergency Medicine

## 2022-02-24 DIAGNOSIS — Z1152 Encounter for screening for COVID-19: Secondary | ICD-10-CM | POA: Diagnosis not present

## 2022-02-24 DIAGNOSIS — K529 Noninfective gastroenteritis and colitis, unspecified: Secondary | ICD-10-CM | POA: Insufficient documentation

## 2022-02-24 DIAGNOSIS — R1110 Vomiting, unspecified: Secondary | ICD-10-CM | POA: Diagnosis not present

## 2022-02-24 LAB — COMPREHENSIVE METABOLIC PANEL
ALT: 14 U/L (ref 0–44)
AST: 18 U/L (ref 15–41)
Albumin: 4.3 g/dL (ref 3.5–5.0)
Alkaline Phosphatase: 51 U/L (ref 38–126)
Anion gap: 9 (ref 5–15)
BUN: 13 mg/dL (ref 6–20)
CO2: 24 mmol/L (ref 22–32)
Calcium: 9.4 mg/dL (ref 8.9–10.3)
Chloride: 102 mmol/L (ref 98–111)
Creatinine, Ser: 0.78 mg/dL (ref 0.44–1.00)
GFR, Estimated: >60 mL/min (ref >60)
Glucose, Bld: 103 mg/dL — ABNORMAL HIGH (ref 70–99)
Potassium: 3.2 mmol/L — ABNORMAL LOW (ref 3.5–5.1)
Sodium: 135 mmol/L (ref 135–145)
Total Bilirubin: 0.7 mg/dL (ref 0.3–1.2)
Total Protein: 8.5 g/dL — ABNORMAL HIGH (ref 6.5–8.1)

## 2022-02-24 LAB — RESP PANEL BY RT-PCR (RSV, FLU A&B, COVID)  RVPGX2
Influenza A by PCR: NEGATIVE
Influenza B by PCR: NEGATIVE
Resp Syncytial Virus by PCR: NEGATIVE
SARS Coronavirus 2 by RT PCR: NEGATIVE

## 2022-02-24 LAB — CBC
HCT: 41.9 % (ref 36.0–46.0)
Hemoglobin: 14.3 g/dL (ref 12.0–15.0)
MCH: 28.6 pg (ref 26.0–34.0)
MCHC: 34.1 g/dL (ref 30.0–36.0)
MCV: 83.8 fL (ref 80.0–100.0)
Platelets: 298 10*3/uL (ref 150–400)
RBC: 5 MIL/uL (ref 3.87–5.11)
RDW: 12.8 % (ref 11.5–15.5)
WBC: 11.3 10*3/uL — ABNORMAL HIGH (ref 4.0–10.5)
nRBC: 0 % (ref 0.0–0.2)

## 2022-02-24 LAB — LIPASE, BLOOD: Lipase: 25 U/L (ref 11–51)

## 2022-02-24 MED ORDER — ONDANSETRON 4 MG PO TBDP
4.0000 mg | ORAL_TABLET | Freq: Once | ORAL | Status: AC
  Administered 2022-02-24: 4 mg via ORAL
  Filled 2022-02-24: qty 1

## 2022-02-24 MED ORDER — ONDANSETRON 4 MG PO TBDP: 4.0000 mg | ORAL_TABLET | Freq: Three times a day (TID) | ORAL | 0 refills | Status: DC | PRN

## 2022-02-24 NOTE — ED Triage Notes (Signed)
Pt to ED for diarrhea, emesis started yesterday. Fever body aches today. NAD noted. Has not taken any meds.  Started on prednisone a week ago

## 2022-02-24 NOTE — ED Provider Notes (Signed)
[facility] Provider Note    Event Date/Time   First MD Initiated Contact with Patient 02/24/22 1017     (approximate)   History   Emesis   HPI  Julia Cherry is a 32 y.o. female who presents with complaints of nausea vomiting diarrhea for approximately 24 hours, she reports her nausea is improved today but continues to have diarrhea.  Abdominal cramping intermittently prior to diarrhea.  Some chills and bodyaches as well.  No sick contacts reported.     Physical Exam   Triage Vital Signs: ED Triage Vitals  Enc Vitals Group     BP 02/24/22 1010 119/66     Pulse Rate 02/24/22 1010 (!) 129     Resp 02/24/22 1009 18     Temp 02/24/22 1009 99.7 F (37.6 C)     Temp src --      SpO2 02/24/22 1010 98 %     Weight 02/24/22 1007 68.5 kg (151 lb 0.2 oz)     Height 02/24/22 1007 1.575 m (5' 2"$ )     Head Circumference --      Peak Flow --      Pain Score 02/24/22 1007 7     Pain Loc --      Pain Edu? --      Excl. in [place]? --     Most recent vital signs: Vitals:   02/24/22 1009 02/24/22 1010  BP:  119/66  Pulse:  (!) 129  Resp: 18   Temp: 99.7 F (37.6 C)   SpO2:  98%     General: Awake, no distress.  CV:  Good peripheral perfusion.  Resp:  Normal effort.  Abd:  No distention.  Soft, nontender Other:     ED Results / Procedures / Treatments   Labs (all labs ordered are listed, but only abnormal results are displayed) Labs Reviewed  COMPREHENSIVE METABOLIC PANEL - Abnormal; Notable for the following components:      Result Value   Potassium 3.2 (*)    Glucose, Bld 103 (*)    Total Protein 8.5 (*)    All other components within normal limits  CBC - Abnormal; Notable for the following components:   WBC 11.3 (*)    All other components within normal limits  RESP PANEL BY RT-PCR (RSV, FLU A&B, COVID)  [id number]  LIPASE, BLOOD  URINALYSIS, ROUTINE W REFLEX MICROSCOPIC  POC URINE PREG, ED      EKG     RADIOLOGY     PROCEDURES:  Critical Care performed:   Procedures   MEDICATIONS ORDERED IN ED: Medications  ondansetron (ZOFRAN-ODT) disintegrating tablet 4 mg (4 mg Oral Given 02/24/22 1116)     IMPRESSION / MDM / ASSESSMENT AND PLAN / ED COURSE  I reviewed the triage vital signs and the nursing notes. Patient's presentation is most consistent with acute illness / injury with system symptoms.   Patient presents with nausea vomiting diarrhea, body aches, chills as above.  Most consistent with viral gastroenteritis, abdominal exam is reassuring.  Patient is tachycardic upon arrival but improved here in the room.  Labs reviewed  Zofran for nausea, supportive care, no indication for further workup in the emergency department       FINAL CLINICAL IMPRESSION(S) / ED DIAGNOSES   Final diagnoses:  Gastroenteritis     Rx / DC Orders   ED Discharge Orders          Ordered    ondansetron (ZOFRAN-ODT)  4 MG disintegrating tablet  Every 8 hours PRN        02/24/22 1112             Note:  This document was prepared using Dragon voice recognition software and may include unintentional dictation errors.   Lavonia Drafts, MD 02/24/22 1209

## 2022-02-24 NOTE — ED Notes (Signed)
See triage note  Presents with n/v/d/  and body aches   States sx's started yesterday  Had low grade temp at home and on arrival

## 2022-02-24 NOTE — ED Triage Notes (Signed)
C/o vomiting and diarrhea over night.  Today also c/o chills and body aches.  Also temp 100.

## 2022-03-10 IMAGING — CR DG CHEST 2V
1 series · 2 of 2 positions shown · non-contrast
Comparison: 05/20/2019

CLINICAL DATA: Panic attack.

EXAM:
CHEST - 2 VIEW

[Series 1: dg chest 2 view · 0.14mm/px · 2 of 2 slices shown]
[im 1/2]
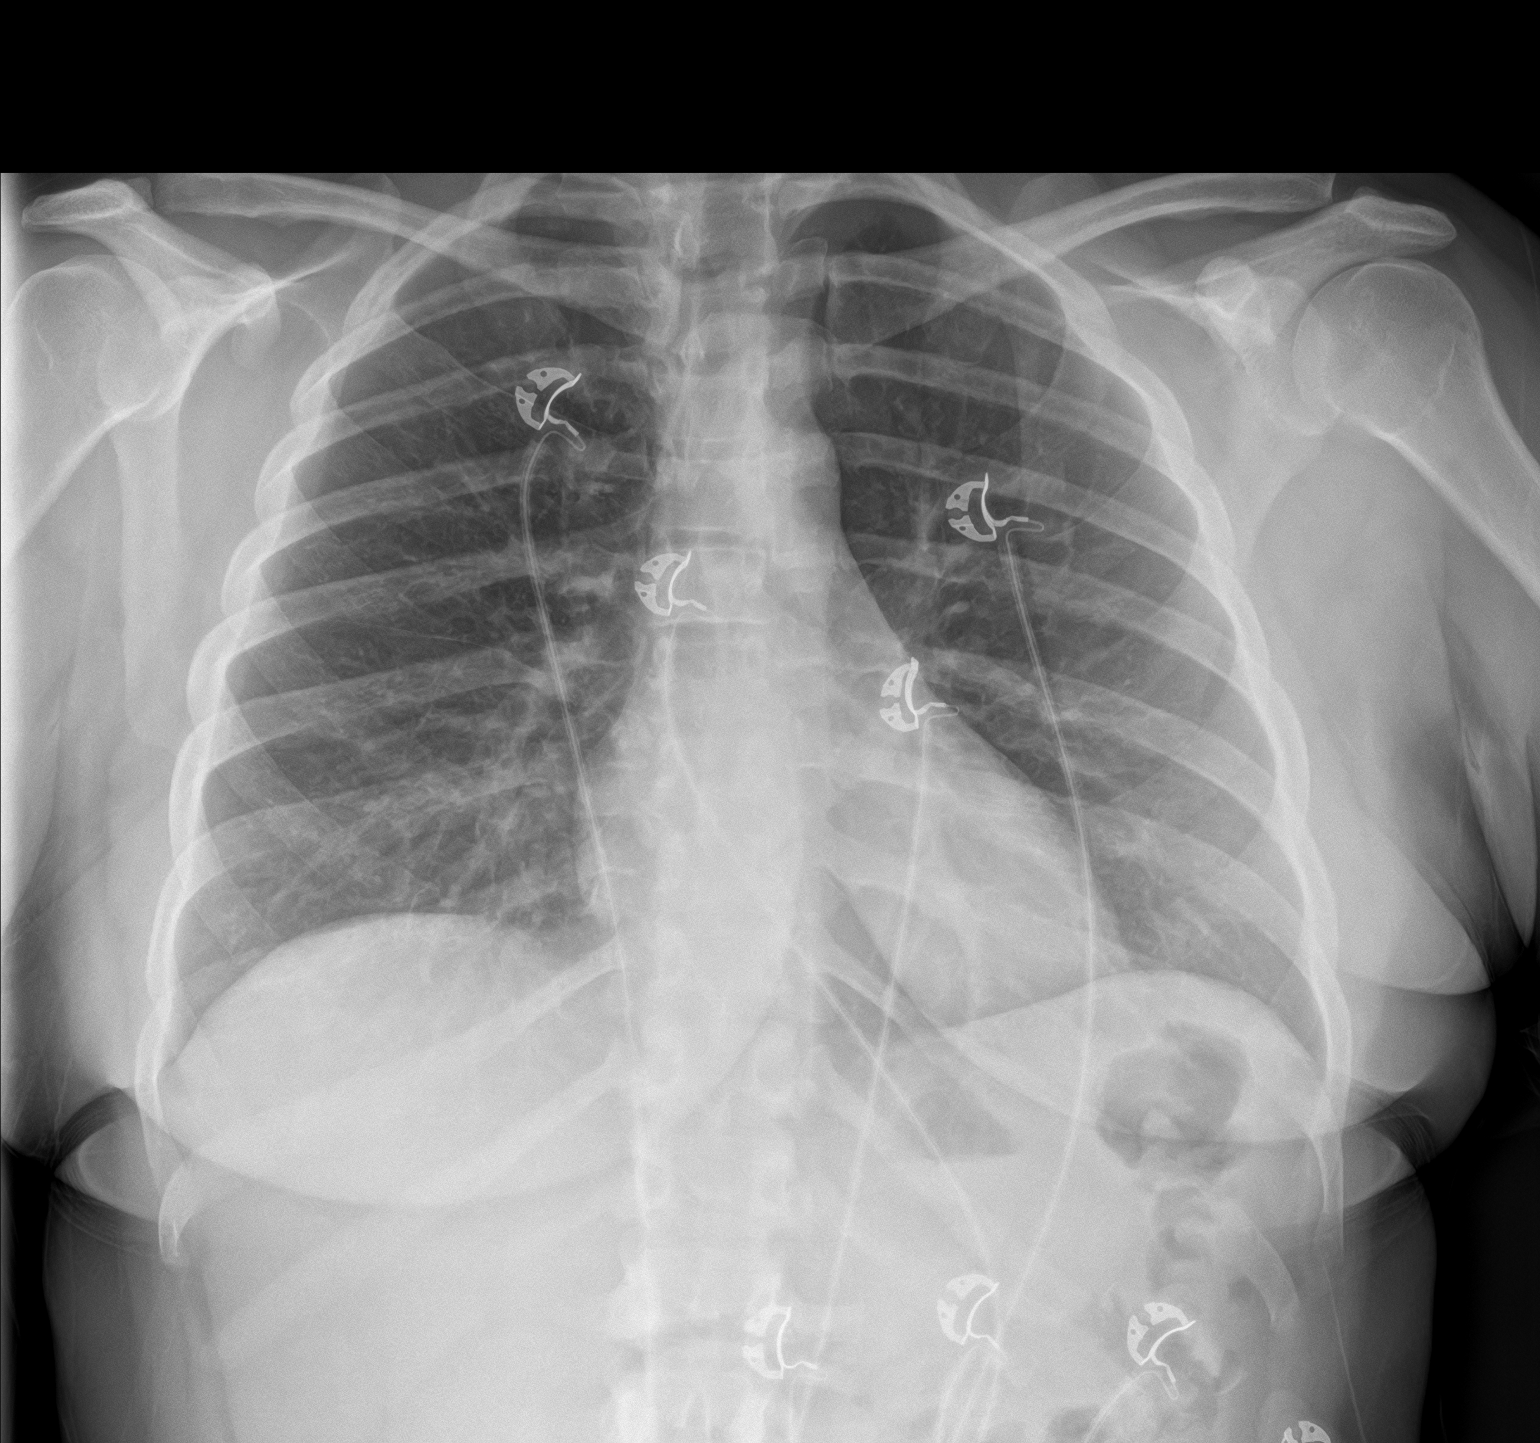
[im 2/2]
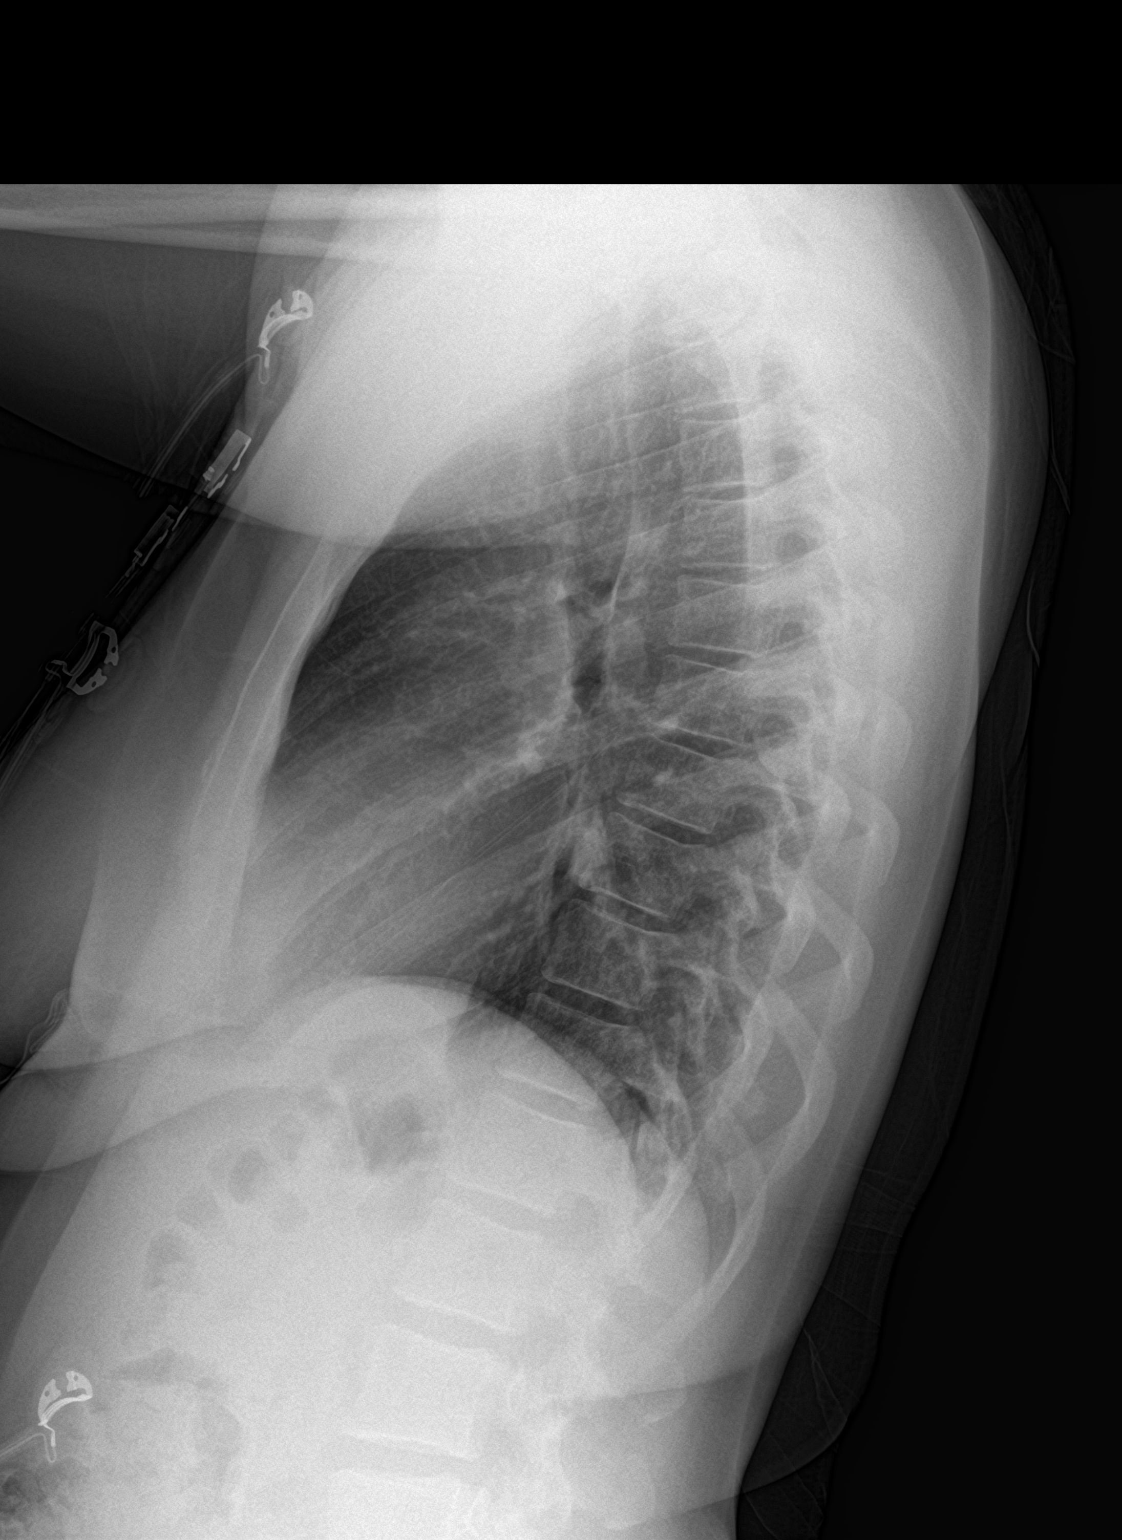

[2 of 2 positions shown; findings below may reference images not displayed]

FINDINGS: The cardiac silhouette, mediastinal and hilar contours are within
normal limits and stable. The lungs are clear. No pleural effusion.
No pneumothorax. The bony thorax is intact.
IMPRESSION: No acute cardiopulmonary findings.

## 2022-03-17 ENCOUNTER — Telehealth: Payer: Self-pay | Admitting: *Deleted

## 2022-03-17 NOTE — Transitions of Care (Post Inpatient/ED Visit) (Signed)
   03/17/2022  Name: Julia Cherry MRN: [id number] DOB: [birth year]  Today's TOC FU Call Status: Today's TOC FU Call Status:: Successful TOC FU Call Competed TOC FU Call Complete Date: 03/17/22  Transition Care Management Follow-up Telephone Call Date of Discharge: 02/24/22 Discharge Facility: [facility]) Type of Discharge: Emergency Department Reason for ED Visit: Other: (GI symptoms) How have you been since you were released from the hospital?: Better Any questions or concerns?: No  Items Reviewed: Did you receive and understand the discharge instructions provided?: Yes Medications obtained and verified?: Yes (Medications Reviewed) Any new allergies since your discharge?: No Dietary orders reviewed?: NA Do you have support at home?: Yes  Home Care and Equipment/Supplies: [place] Ordered?: No Any new equipment or medical supplies ordered?: No  Functional Questionnaire: Do you need assistance with bathing/showering or dressing?: No Do you need assistance with meal preparation?: No Do you need assistance with eating?: No Do you have difficulty maintaining continence: No Do you need assistance with getting out of bed/getting out of a chair/moving?: No Do you have difficulty managing or taking your medications?: No  Folllow up appointments reviewed: PCP Follow-up appointment confirmed?: NA Specialist Hospital Follow-up appointment confirmed?: NA Do you need transportation to your follow-up appointment?: No Do you understand care options if your condition(s) worsen?: Yes-patient verbalized understanding  SDOH Interventions Today    Flowsheet Row Most Recent Value  SDOH Interventions   Food Insecurity Interventions Intervention Not Indicated  Housing Interventions Intervention Not Indicated  Transportation Interventions Intervention Not Indicated      Julia Cherry was given information about care management services  as a benefit of their Medicaid health plan today.    Patient                                              agreed to services and verbal consent obtained.    Julia Joiner RN, BSN [place]  [employer] manager

## 2022-03-25 ENCOUNTER — Other Ambulatory Visit: Payer: BC Managed Care – PPO | Admitting: *Deleted

## 2022-03-25 NOTE — Patient Outreach (Signed)
  Medicaid Managed Care   Unsuccessful Attempt Note   03/25/2022 Name: Julia Cherry MRN: [id number] DOB: 01-11-1991  Referred by: Bo Merino, FNP Reason for referral : High Risk Managed Medicaid (Unsuccessful RNCM initial outreach)   An unsuccessful telephone outreach was attempted today. The patient was referred to the case management team for assistance with care management and care coordination.    Follow Up Plan: A HIPAA compliant phone message was left for the patient providing contact information and requesting a return call. and The Managed Medicaid care management team will reach out to the patient again over the next 7 days.    Lurena Joiner RN, BSN [place]  [employer] manager

## 2022-03-25 NOTE — Patient Instructions (Signed)
Visit Information  Ms. Lovie Macadamia  - as a part of your Medicaid benefit, you are eligible for care management and care coordination services at no cost or copay. I was unable to reach you by phone today but would be happy to help you with your health related needs. Please feel free to call me @ [phone].   A member of the Managed Medicaid care management team will reach out to you again over the next 7 days.   Lurena Joiner RN, BSN [place]  [employer] manager

## 2022-03-31 ENCOUNTER — Telehealth: Payer: Self-pay

## 2022-03-31 NOTE — Telephone Encounter (Signed)
..   Medicaid Managed Care   Unsuccessful Outreach Note  03/31/2022 Name: Julia Cherry MRN: [id number] DOB: [birth year]  Referred by: Bo Merino, FNP Reason for referral : Appointment (I called the patient today to get her missed phone appt rescheduled with the MM RNCM. I left my name and number on her VM.)   A second unsuccessful telephone outreach was attempted today. The patient was referred to the case management team for assistance with care management and care coordination.   Follow Up Plan: A HIPAA compliant phone message was left for the patient providing contact information and requesting a return call.  The care management team will reach out to the patient again over the next 7 days.   [place]

## 2022-04-07 ENCOUNTER — Telehealth: Payer: Self-pay

## 2022-04-07 NOTE — Telephone Encounter (Signed)
..   Medicaid Managed Care   Unsuccessful Outreach Note  04/07/2022 Name: Julia Cherry MRN: [id number] DOB: 15-Sep-1990  Referred by: Bo Merino, FNP Reason for referral : Appointment   Third unsuccessful telephone outreach was attempted today. The patient was referred to the case management team for assistance with care management and care coordination. The patient's primary care provider has been notified of our unsuccessful attempts to make or maintain contact with the patient. The care management team is pleased to engage with this patient at any time in the future should he/she be interested in assistance from the care management team.   Follow Up Plan: We have been unable to make contact with the patient for follow up. The care management team is available to follow up with the patient after provider conversation with the patient regarding recommendation for care management engagement and subsequent re-referral to the care management team.   [place]  [phone]

## 2022-04-22 DIAGNOSIS — H31023 Solar retinopathy, bilateral: Secondary | ICD-10-CM | POA: Diagnosis not present

## 2022-05-16 ENCOUNTER — Encounter: Payer: Self-pay | Admitting: Family Medicine

## 2022-05-16 ENCOUNTER — Ambulatory Visit (INDEPENDENT_AMBULATORY_CARE_PROVIDER_SITE_OTHER): Payer: Medicaid Other | Admitting: Family Medicine

## 2022-05-16 VITALS — BP 132/78 | HR 98 | Temp 98.0°F | Resp 16 | Ht 62.0 in | Wt 158.0 lb

## 2022-05-16 DIAGNOSIS — J4541 Moderate persistent asthma with (acute) exacerbation: Secondary | ICD-10-CM | POA: Diagnosis not present

## 2022-05-16 DIAGNOSIS — J069 Acute upper respiratory infection, unspecified: Secondary | ICD-10-CM | POA: Diagnosis not present

## 2022-05-16 DIAGNOSIS — J302 Other seasonal allergic rhinitis: Secondary | ICD-10-CM | POA: Diagnosis not present

## 2022-05-16 MED ORDER — ALBUTEROL SULFATE (2.5 MG/3ML) 0.083% IN NEBU
2.5000 mg | INHALATION_SOLUTION | Freq: Once | RESPIRATORY_TRACT | Status: AC
  Administered 2022-05-16: 2.5 mg via RESPIRATORY_TRACT

## 2022-05-16 MED ORDER — LORATADINE 10 MG PO TABS: 10.0000 mg | ORAL_TABLET | Freq: Every day | ORAL | 11 refills | Status: AC

## 2022-05-16 MED ORDER — MONTELUKAST SODIUM 10 MG PO TABS: 10.0000 mg | ORAL_TABLET | Freq: Every day | ORAL | 3 refills | Status: AC

## 2022-05-16 MED ORDER — PREDNISONE 20 MG PO TABS: 40.0000 mg | ORAL_TABLET | Freq: Every day | ORAL | 0 refills | Status: DC

## 2022-05-16 MED ORDER — SYMBICORT 160-4.5 MCG/ACT IN AERO: 2.0000 | INHALATION_SPRAY | Freq: Two times a day (BID) | RESPIRATORY_TRACT | 12 refills | Status: AC

## 2022-05-16 MED ORDER — IPRATROPIUM-ALBUTEROL 0.5-2.5 (3) MG/3ML IN SOLN: 3.0000 mL | Freq: Four times a day (QID) | RESPIRATORY_TRACT | 1 refills | Status: AC | PRN

## 2022-05-16 MED ORDER — ALBUTEROL SULFATE HFA 108 (90 BASE) MCG/ACT IN AERS: 2.0000 | INHALATION_SPRAY | RESPIRATORY_TRACT | 4 refills | Status: DC | PRN

## 2022-05-16 MED ORDER — DEXAMETHASONE SODIUM PHOSPHATE 10 MG/ML IJ SOLN
10.0000 mg | Freq: Once | INTRAMUSCULAR | Status: AC
  Administered 2022-05-16: 10 mg via INTRAMUSCULAR

## 2022-05-16 MED ORDER — BENZONATATE 100 MG PO CAPS: 100.0000 mg | ORAL_CAPSULE | Freq: Three times a day (TID) | ORAL | 0 refills | Status: DC | PRN

## 2022-05-16 NOTE — Progress Notes (Signed)
Patient ID: Julia Cherry, female    DOB: 1990-12-30, 32 y.o.   MRN: [id number]  PCP: Berniece Salines, FNP  Chief Complaint  Patient presents with   Cough    Pt believes has bronchitis comes along every year with allergies, SOB at nights   Allergies    Subjective:   Julia Cherry is a 32 y.o. female, presents to clinic with CC of the following:  Asthma She complains of chest tightness, cough, difficulty breathing, frequent throat clearing, shortness of breath and wheezing. This is a recurrent problem. Associated symptoms include dyspnea on exertion, headaches, malaise/fatigue, nasal congestion, postnasal drip, rhinorrhea and sneezing. Pertinent negatives include no appetite change, chest pain, ear congestion, ear pain, fever, heartburn, myalgias, orthopnea, PND, sore throat, sweats, trouble swallowing or weight loss. Her symptoms are aggravated by nothing. Her past medical history is significant for asthma and bronchitis.   Currently managing asthma with symbicort (but may be out of inhalers per chart) singulair, xyzal, she has albuterol nebs at home, she hasn't tried to do nebs with worsening sx Generalized malaise, no sick contacts but her friend is now getting sick too Last night she had a lot worse and more severe coughing/wheezing and it woke her up last night too   Patient Active Problem List   Diagnosis Date Noted   Severe episode of recurrent major depressive disorder, without psychotic features (HCC) 01/23/2022   Monoallelic mutation of CHEK2 gene in female patient 10/15/2021   Genetic testing 10/15/2021   Family history of breast cancer gene mutation in first degree relative 10/15/2021   Insomnia 10/15/2021   Daytime sleepiness 10/15/2021   Circadian rhythm sleep disorder, shift work type 10/15/2021   Seasonal allergies 10/15/2021   Moderate persistent asthma with exacerbation 10/15/2021   LGSIL on Pap smear of cervix 10/15/2021   Shoulder  tendonitis 07/26/2020   Anxiety 07/26/2020   Patella-femoral syndrome 07/26/2020      Current Outpatient Medications:    albuterol (VENTOLIN HFA) 108 (90 Base) MCG/ACT inhaler, Inhale 2 puffs into the lungs every 4 (four) hours as needed for wheezing or shortness of breath., Disp: 1 each, Rfl: 3   albuterol (VENTOLIN HFA) 108 (90 Base) MCG/ACT inhaler, Inhale 2 puffs into the lungs every 4 (four) hours as needed for wheezing or shortness of breath., Disp: 1 each, Rfl: 4   amitriptyline (ELAVIL) 10 MG tablet, TAKE 1 TABLET BY MOUTH EVERYDAY AT BEDTIME, Disp: 90 tablet, Rfl: 0   ARIPiprazole (ABILIFY) 2 MG tablet, Take 1 tablet (2 mg total) by mouth daily., Disp: 30 tablet, Rfl: 0   benzonatate (TESSALON) 100 MG capsule, Take 1 capsule (100 mg total) by mouth 3 (three) times daily as needed for cough., Disp: 30 capsule, Rfl: 0   busPIRone (BUSPAR) 10 MG tablet, Take 1 tablet (10 mg total) by mouth 2 (two) times daily., Disp: 180 tablet, Rfl: 1   escitalopram (LEXAPRO) 10 MG tablet, Take 1 tablet (10 mg total) by mouth daily., Disp: 90 tablet, Rfl: 3   hydrOXYzine (VISTARIL) 25 MG capsule, Take 1 capsule (25 mg total) by mouth every 8 (eight) hours as needed for anxiety., Disp: 90 capsule, Rfl: 3   loratadine (CLARITIN) 10 MG tablet, Take 1 tablet (10 mg total) by mouth daily., Disp: 30 tablet, Rfl: 11   ondansetron (ZOFRAN-ODT) 4 MG disintegrating tablet, Take 1 tablet (4 mg total) by mouth every 8 (eight) hours as needed for nausea or vomiting., Disp: 20 tablet, Rfl: 0  predniSONE (DELTASONE) 20 MG tablet, Take 2 tablets (40 mg total) by mouth daily., Disp: 10 tablet, Rfl: 0   tamoxifen (NOLVADEX) 20 MG tablet, Take 1 tablet (20 mg total) by mouth daily., Disp: 30 tablet, Rfl: 6   ipratropium-albuterol (DUONEB) 0.5-2.5 (3) MG/3ML SOLN, Take 3 mLs by nebulization every 6 (six) hours as needed (asthma/wheeze/coughing fits)., Disp: 360 mL, Rfl: 1   montelukast (SINGULAIR) 10 MG tablet, Take 1  tablet (10 mg total) by mouth at bedtime., Disp: 90 tablet, Rfl: 3   SYMBICORT 160-4.5 MCG/ACT inhaler, Inhale 2 puffs into the lungs in the morning and at bedtime., Disp: 1 each, Rfl: 12   Allergies  Allergen Reactions   Bee Venom Anaphylaxis   Strawberry (Diagnostic) Anaphylaxis     Social History   Tobacco Use   Smoking status: Never   Smokeless tobacco: Never  Vaping Use   Vaping Use: Never used  Substance Use Topics   Alcohol use: No   Drug use: No      Chart Review Today: I personally reviewed active problem list, medication list, allergies, family history, social history, health maintenance, notes from last encounter, lab results, imaging with the patient/caregiver today.   Review of Systems  Constitutional:  Positive for malaise/fatigue. Negative for appetite change, fever and weight loss.  HENT:  Positive for postnasal drip, rhinorrhea and sneezing. Negative for ear pain, sore throat and trouble swallowing.   Eyes: Negative.   Respiratory:  Positive for cough, shortness of breath and wheezing.   Cardiovascular:  Positive for dyspnea on exertion. Negative for chest pain and PND.  Gastrointestinal: Negative.  Negative for heartburn.  Endocrine: Negative.   Genitourinary: Negative.   Musculoskeletal: Negative.  Negative for myalgias.  Skin: Negative.   Allergic/Immunologic: Negative.   Neurological:  Positive for headaches.  Hematological: Negative.   Psychiatric/Behavioral: Negative.    All other systems reviewed and are negative.      Objective:   Vitals:   05/16/22 1404  BP: 132/78  Pulse: 98  Resp: 16  Temp: 98 F (36.7 C)  TempSrc: Oral  SpO2: 99%  Weight: 158 lb (71.7 kg)  Height: 5\' 2"  (1.575 m)    Body mass index is 28.9 kg/m.  Physical Exam Vitals and nursing note reviewed.  Constitutional:      General: She is not in acute distress.    Appearance: Normal appearance. She is well-developed. She is not ill-appearing, toxic-appearing or  diaphoretic.  HENT:     Head: Normocephalic and atraumatic.     Right Ear: Tympanic membrane, ear canal and external ear normal. There is no impacted cerumen.     Left Ear: Tympanic membrane, ear canal and external ear normal. There is no impacted cerumen.     Nose: Congestion and rhinorrhea present.     Mouth/Throat:     Mouth: Mucous membranes are moist.     Pharynx: Uvula midline. Posterior oropharyngeal erythema present. No oropharyngeal exudate.  Eyes:     General: Lids are normal. No scleral icterus.       Right eye: No discharge.        Left eye: No discharge.     Conjunctiva/sclera: Conjunctivae normal.  Neck:     Trachea: Phonation normal. No tracheal deviation.  Cardiovascular:     Rate and Rhythm: Normal rate and regular rhythm.     Pulses: Normal pulses.          Radial pulses are 2+ on the right side and 2+ on  the left side.       Posterior tibial pulses are 2+ on the right side and 2+ on the left side.     Heart sounds: Normal heart sounds. No murmur heard.    No friction rub. No gallop.  Pulmonary:     Effort: Pulmonary effort is normal. No respiratory distress.     Breath sounds: Normal breath sounds. No stridor. No wheezing, rhonchi or rales.     Comments: Frequent coughing fits Chest:     Chest wall: No tenderness.  Abdominal:     General: Bowel sounds are normal. There is no distension.     Palpations: Abdomen is soft.     Tenderness: There is no abdominal tenderness. There is no guarding or rebound.  Lymphadenopathy:     Cervical: No cervical adenopathy.  Skin:    General: Skin is warm and dry.     Capillary Refill: Capillary refill takes less than 2 seconds.     Coloration: Skin is not pale.     Findings: No rash.  Neurological:     Mental Status: She is alert and oriented to person, place, and time.     Motor: No abnormal muscle tone.     Gait: Gait normal.  Psychiatric:        Speech: Speech normal.        Behavior: Behavior normal.       Results for orders placed or performed during the hospital encounter of 02/24/22  Resp panel by RT-PCR (RSV, Flu A&B, Covid) Anterior Nasal Swab   Specimen: Anterior Nasal Swab  Result Value Ref Range   SARS Coronavirus 2 by RT PCR NEGATIVE NEGATIVE   Influenza A by PCR NEGATIVE NEGATIVE   Influenza B by PCR NEGATIVE NEGATIVE   Resp Syncytial Virus by PCR NEGATIVE NEGATIVE  Lipase, blood  Result Value Ref Range   Lipase 25 11 - 51 U/L  Comprehensive metabolic panel  Result Value Ref Range   Sodium 135 135 - 145 mmol/L   Potassium 3.2 (L) 3.5 - 5.1 mmol/L   Chloride 102 98 - 111 mmol/L   CO2 24 22 - 32 mmol/L   Glucose, Bld 103 (H) 70 - 99 mg/dL   BUN 13 6 - 20 mg/dL   Creatinine, Ser 1.61 0.44 - 1.00 mg/dL   Calcium 9.4 8.9 - 09.6 mg/dL   Total Protein 8.5 (H) 6.5 - 8.1 g/dL   Albumin 4.3 3.5 - 5.0 g/dL   AST 18 15 - 41 U/L   ALT 14 0 - 44 U/L   Alkaline Phosphatase 51 38 - 126 U/L   Total Bilirubin 0.7 0.3 - 1.2 mg/dL   GFR, Estimated >04 >54 mL/min   Anion gap 9 5 - 15  CBC  Result Value Ref Range   WBC 11.3 (H) 4.0 - 10.5 K/uL   RBC 5.00 3.87 - 5.11 MIL/uL   Hemoglobin 14.3 12.0 - 15.0 g/dL   HCT 09.8 11.9 - 14.7 %   MCV 83.8 80.0 - 100.0 fL   MCH 28.6 26.0 - 34.0 pg   MCHC 34.1 30.0 - 36.0 g/dL   RDW 82.9 56.2 - 13.0 %   Platelets 298 150 - 400 K/uL   nRBC 0.0 0.0 - 0.2 %       Assessment & Plan:      ICD-10-CM   1. Moderate persistent asthma with exacerbation  J45.41 predniSONE (DELTASONE) 20 MG tablet    ipratropium-albuterol (DUONEB) 0.5-2.5 (3) MG/3ML SOLN  loratadine (CLARITIN) 10 MG tablet    benzonatate (TESSALON) 100 MG capsule    albuterol (VENTOLIN HFA) 108 (90 Base) MCG/ACT inhaler    SYMBICORT 160-4.5 MCG/ACT inhaler   onset within the last couple days, cough/asthma sx much worse yesterday and last night    2. Seasonal allergies  J30.2 montelukast (SINGULAIR) 10 MG tablet    loratadine (CLARITIN) 10 MG tablet   worse sx, add second  antihistamine to current meds    3. Upper respiratory tract infection, unspecified type  J06.9    unclear if worsening allergies or viral, drainage, sneezing worse, try adding claritin in am, keep taking xyzal and sinulair for allergies      Return for f/up next week if not improving with steroids and nebs.     [name], PA-C 05/16/22 2:24 PM

## 2022-05-16 NOTE — Patient Instructions (Signed)
Asthma Attack Prevention, Adult Although you may not be able to change the fact that you have asthma, you can take actions to prevent episodes of asthma (asthma attacks). How can this condition affect me? Asthma attacks (flare ups) can cause trouble breathing, high-pitched whistling sounds when you breathe, most often when you breathe out (wheeze), and coughing. They may keep you from doing activities you like to do. What can increase my risk? Coming into contact with things that cause asthma symptoms (asthma triggers) can put you at risk for an asthma attack. Common asthma triggers include: Things you are allergic to (allergens), such as: Dust mite and cockroach droppings. Pet dander. Mold. Pollen from trees and grasses. Food allergies. This might be a specific food or added chemicals called sulfites. Irritants, such as: Weather changes including very cold, dry, or humid air. Smoke. This includes campfire smoke, air pollution, and tobacco smoke. Strong odors from aerosol sprays and fumes from perfume, candles, and household cleaners. Other triggers, such as: Certain medicines. This includes NSAIDs, such as ibuprofen and aspirin. Viral respiratory infections (colds), including runny nose (rhinitis) or infection in the sinuses (sinusitis). Activity, including exercise, laughing, or crying. Not using inhaled medicines (corticosteroids) as told. What actions can I take to prevent an asthma attack? Stay healthy. Stay up to date on all immunizations as told by your health care provider, including the yearly flu (influenza) vaccine and pneumonia vaccine. Many asthma attacks can be prevented by carefully following your written asthma action plan. Follow your asthma action plan Work with your health care provider to create a written asthma action plan. This plan should include: A list of your asthma triggers and how to avoid or reduce them. A list of symptoms that you may have during an asthma  attack. Information about which medicine to take, when to take the medicine, and how much of the medicine to take. Information to help you understand your peak flow measurements. Daily actions that you can take to prevent (control) your asthma symptoms. Contact information for your health care providers. If you have an asthma attack, act quickly. Follow the emergency steps on your written asthma action plan. This may prevent you from needing to go to the hospital. Monitor your asthma. To do this: Use your peak flow meter every morning and every evening for 2-3 weeks or as told by your health care provider. Record the results in a journal. A drop in your peak flow numbers on one or more days may mean that you are starting to have an asthma attack, even if you are not having symptoms. When you have asthma symptoms, write them down in a journal. Write down in your journal how often you need to use your fast-acting rescue inhaler. If you are using your rescue inhaler more often, it may mean that your asthma is not under control. Talk with your health care provider about adjusting your asthma treatment plan to help you prevent future asthma attacks and gain better control of your condition.  Lifestyle Avoid or reduce contact with known outdoor allergens by staying indoors, keeping windows closed, and using air conditioning when pollen and mold counts are high. Do not use any products that contain nicotine or tobacco. These products include cigarettes, chewing tobacco, and vaping devices, such as e-cigarettes. If you need help quitting, ask your health care provider. If you are overweight, consider a weight-loss plan. Find ways to cope with stress and your feelings, such as mindfulness, relaxation, or breathing exercises. Ask your   health care provider if a breathing exercise program (pulmonary rehabilitation) may be helpful to control symptoms and improve your quality of life. Medicines  Take  over-the-counter and prescription medicines only as told by your health care provider. Do not stop taking your medicine and do not take less medicine even if you start to feel better. Let your health care provider know: How often you use your rescue inhaler. How often you have symptoms when you are taking your regular medicines. If you wake up at night because of asthma symptoms. If you have more trouble with your breathing when you exercise. Activity Do your normal activities as told by your health care provider. Ask your health care provider what activities are safe for you. Some people have asthma symptoms or more asthma symptoms when they exercise. This is called exercise-induced bronchoconstriction (EIB). If you have this problem, talk with your health care provider about how to manage EIB. Some tips to follow include: Use your fast-acting inhaler before exercise. Exercise indoors if it is very cold, humid, or the pollen and mold counts are high. Warm up and cool down before and after exercise. Stop exercising right away if your asthma symptoms start or get worse. Where to find more information Asthma and Allergy Foundation of America: www.aafa.org Centers for Disease Control and Prevention: www.cdc.gov American Lung Association: www.lung.org National Heart, Lung, and Blood Institute: www.nhlbi.nih.gov World Health Organization: www.who.int Get help right away if: You have followed your written asthma action plan and your symptoms are not improving. Summary Asthma attacks (flare ups) can cause trouble breathing, high-pitched whistling sounds when you breathe, most often when you breathe out (wheeze), and coughing. Work with your health care provider to create a written asthma action plan. Do not stop taking your medicine and do not take less medicine even if you are feeling better. Do not use any products that contain nicotine or tobacco. These products include cigarettes, chewing  tobacco, and vaping devices, such as e-cigarettes. If you need help quitting, ask your health care provider. This information is not intended to replace advice given to you by your health care provider. Make sure you discuss any questions you have with your health care provider. Document Revised: 06/27/2020 Document Reviewed: 06/27/2020 Elsevier Patient Education  2023 Elsevier Inc.  

## 2022-07-15 ENCOUNTER — Ambulatory Visit: Payer: Self-pay | Admitting: *Deleted

## 2022-07-15 NOTE — Telephone Encounter (Signed)
  Chief Complaint: shoulder/arm pain- started 05/17/22 Symptoms: pain and weakness in shoulder, arm and hand- patient has been evaluated and has been advised she may need to see ortho specialist- inflammation present Frequency: started 05/17/22- ongoing Pertinent Negatives: Patient denies fever, rash Disposition: [] ED /[x] Urgent Care (no appt availability in office) / [] Appointment(In office/virtual)/ []  [facility]/ [] Home Care/ [] Refused Recommended Disposition /[] [facility]/ []  Follow-up with PCP Additional Notes: Patient is requesting appointment sooner due to her job requesting FMLA paperwork for her condition- call to office- no open appointment within disposition- she has been put on cancellation list and advised of ortho UC option. She will be seen at [facility] if she should become worse before her scheduled appointment.

## 2022-07-15 NOTE — Telephone Encounter (Signed)
Reason for Disposition  [1] MODERATE pain (e.g., interferes with normal activities) AND [2] present > 3 days  Answer Assessment - Initial Assessment Questions 1. ONSET: "When did the pain start?"     05/17/22 2. LOCATION: "Where is the pain located?"     Right side- shoulder, back, arm-hand 3. PAIN: "How bad is the pain?" (Scale 1-10; or mild, moderate, severe)   - MILD (1-3): doesn't interfere with normal activities   - MODERATE (4-7): interferes with normal activities (e.g., work or school) or awakens from sleep   - SEVERE (8-10): excruciating pain, unable to do any normal activities, unable to move arm at all due to pain     moderate 4. WORK OR EXERCISE: "Has there been any recent work or exercise that involved this part of the body?"     Patient started a new job and the pain started soon after- 3 weeks 5. CAUSE: "What do you think is causing the shoulder pain?"     Repeat work- inflamation 6. OTHER SYMPTOMS: "Do you have any other symptoms?" (e.g., neck pain, swelling, rash, fever, numbness, weakness)     Weakness in hand/arm  Protocols used: Shoulder Pain-A-AH

## 2022-07-16 ENCOUNTER — Telehealth: Payer: Self-pay

## 2022-07-16 NOTE — Telephone Encounter (Signed)
FMLA paperwork received. Patient has appt scheduled with [name] PA on 07/23/2022. Paperwork placed in file up front.

## 2022-07-21 NOTE — Progress Notes (Unsigned)
   There were no vitals taken for this visit.   Subjective:    Patient ID: Julia Cherry, female    DOB: 1990/01/15, 32 y.o.   MRN: [id number]  HPI: Julia Cherry is a 32 y.o. female  No chief complaint on file.   Relevant past medical, surgical, family and social history reviewed and updated as indicated. Interim medical history since our last visit reviewed. Allergies and medications reviewed and updated.  Review of Systems  Per HPI unless specifically indicated above     Objective:    There were no vitals taken for this visit.  Wt Readings from Last 3 Encounters:  05/16/22 158 lb (71.7 kg)  02/24/22 149 lb 14.6 oz (68 kg)  02/05/22 151 lb 0.2 oz (68.5 kg)    Physical Exam  Results for orders placed or performed during the hospital encounter of 02/24/22  Resp panel by RT-PCR (RSV, Flu A&B, Covid) Anterior Nasal Swab   Specimen: Anterior Nasal Swab  Result Value Ref Range   SARS Coronavirus 2 by RT PCR NEGATIVE NEGATIVE   Influenza A by PCR NEGATIVE NEGATIVE   Influenza B by PCR NEGATIVE NEGATIVE   Resp Syncytial Virus by PCR NEGATIVE NEGATIVE  Lipase, blood  Result Value Ref Range   Lipase 25 11 - 51 U/L  Comprehensive metabolic panel  Result Value Ref Range   Sodium 135 135 - 145 mmol/L   Potassium 3.2 (L) 3.5 - 5.1 mmol/L   Chloride 102 98 - 111 mmol/L   CO2 24 22 - 32 mmol/L   Glucose, Bld 103 (H) 70 - 99 mg/dL   BUN 13 6 - 20 mg/dL   Creatinine, Ser 4.09 0.44 - 1.00 mg/dL   Calcium 9.4 8.9 - 81.1 mg/dL   Total Protein 8.5 (H) 6.5 - 8.1 g/dL   Albumin 4.3 3.5 - 5.0 g/dL   AST 18 15 - 41 U/L   ALT 14 0 - 44 U/L   Alkaline Phosphatase 51 38 - 126 U/L   Total Bilirubin 0.7 0.3 - 1.2 mg/dL   GFR, Estimated >91 >47 mL/min   Anion gap 9 5 - 15  CBC  Result Value Ref Range   WBC 11.3 (H) 4.0 - 10.5 K/uL   RBC 5.00 3.87 - 5.11 MIL/uL   Hemoglobin 14.3 12.0 - 15.0 g/dL   HCT 82.9 56.2 - 13.0 %   MCV 83.8 80.0 - 100.0 fL   MCH 28.6 26.0  - 34.0 pg   MCHC 34.1 30.0 - 36.0 g/dL   RDW 86.5 78.4 - 69.6 %   Platelets 298 150 - 400 K/uL   nRBC 0.0 0.0 - 0.2 %      Assessment & Plan:   Problem List Items Addressed This Visit   None    Follow up plan: No follow-ups on file.

## 2022-07-22 ENCOUNTER — Ambulatory Visit (INDEPENDENT_AMBULATORY_CARE_PROVIDER_SITE_OTHER): Payer: BC Managed Care – PPO | Admitting: Nurse Practitioner

## 2022-07-22 ENCOUNTER — Encounter: Payer: Self-pay | Admitting: Nurse Practitioner

## 2022-07-22 ENCOUNTER — Other Ambulatory Visit: Payer: Self-pay

## 2022-07-22 ENCOUNTER — Other Ambulatory Visit: Payer: Self-pay | Admitting: Nurse Practitioner

## 2022-07-22 VITALS — BP 120/84 | HR 91 | Temp 97.9°F | Resp 18 | Ht 62.0 in | Wt 162.8 lb

## 2022-07-22 DIAGNOSIS — Z0289 Encounter for other administrative examinations: Secondary | ICD-10-CM

## 2022-07-22 DIAGNOSIS — M25511 Pain in right shoulder: Secondary | ICD-10-CM

## 2022-07-22 MED ORDER — MELOXICAM 15 MG PO TABS: 15.0000 mg | ORAL_TABLET | Freq: Every day | ORAL | 0 refills | Status: DC

## 2022-07-22 MED ORDER — METHOCARBAMOL 500 MG PO TABS: 500.0000 mg | ORAL_TABLET | Freq: Every day | ORAL | 0 refills | Status: AC

## 2022-07-22 NOTE — Telephone Encounter (Signed)
Please put in new referral 

## 2022-07-23 ENCOUNTER — Ambulatory Visit: Payer: Medicaid Other | Admitting: Family Medicine

## 2022-07-28 ENCOUNTER — Inpatient Hospital Stay: Payer: Medicaid Other | Attending: Internal Medicine

## 2022-07-28 ENCOUNTER — Encounter: Payer: Self-pay | Admitting: Internal Medicine

## 2022-07-28 ENCOUNTER — Inpatient Hospital Stay (HOSPITAL_BASED_OUTPATIENT_CLINIC_OR_DEPARTMENT_OTHER): Payer: Medicaid Other | Admitting: Internal Medicine

## 2022-07-28 VITALS — BP 130/84 | HR 96 | Temp 99.5°F | Ht 62.0 in | Wt 158.2 lb

## 2022-07-28 DIAGNOSIS — Z1509 Genetic susceptibility to other malignant neoplasm: Secondary | ICD-10-CM

## 2022-07-28 DIAGNOSIS — Z803 Family history of malignant neoplasm of breast: Secondary | ICD-10-CM | POA: Insufficient documentation

## 2022-07-28 DIAGNOSIS — Z79810 Long term (current) use of selective estrogen receptor modulators (SERMs): Secondary | ICD-10-CM | POA: Diagnosis not present

## 2022-07-28 DIAGNOSIS — Z1371 Encounter for nonprocreative screening for genetic disease carrier status: Secondary | ICD-10-CM | POA: Diagnosis not present

## 2022-07-28 DIAGNOSIS — Z8481 Family history of carrier of genetic disease: Secondary | ICD-10-CM

## 2022-07-28 DIAGNOSIS — Z1501 Genetic susceptibility to malignant neoplasm of breast: Secondary | ICD-10-CM

## 2022-07-28 DIAGNOSIS — Z79890 Hormone replacement therapy (postmenopausal): Secondary | ICD-10-CM

## 2022-07-28 DIAGNOSIS — R232 Flushing: Secondary | ICD-10-CM | POA: Diagnosis not present

## 2022-07-28 DIAGNOSIS — Z800 Family history of malignant neoplasm of digestive organs: Secondary | ICD-10-CM | POA: Diagnosis not present

## 2022-07-28 DIAGNOSIS — Z1589 Genetic susceptibility to other disease: Secondary | ICD-10-CM | POA: Diagnosis not present

## 2022-07-28 DIAGNOSIS — Z1502 Genetic susceptibility to malignant neoplasm of ovary: Secondary | ICD-10-CM | POA: Diagnosis not present

## 2022-07-28 LAB — CBC WITH DIFFERENTIAL/PLATELET
Abs Immature Granulocytes: 0.03 10*3/uL (ref 0.00–0.07)
Basophils Absolute: 0 10*3/uL (ref 0.0–0.1)
Basophils Relative: 0 %
Eosinophils Absolute: 0.2 10*3/uL (ref 0.0–0.5)
Eosinophils Relative: 2 %
HCT: 38.6 % (ref 36.0–46.0)
Hemoglobin: 13 g/dL (ref 12.0–15.0)
Immature Granulocytes: 0 %
Lymphocytes Relative: 27 %
Lymphs Abs: 2.6 10*3/uL (ref 0.7–4.0)
MCH: 29 pg (ref 26.0–34.0)
MCHC: 33.7 g/dL (ref 30.0–36.0)
MCV: 86 fL (ref 80.0–100.0)
Monocytes Absolute: 0.7 10*3/uL (ref 0.1–1.0)
Monocytes Relative: 7 %
Neutro Abs: 6.2 10*3/uL (ref 1.7–7.7)
Neutrophils Relative %: 64 %
Platelets: 298 10*3/uL (ref 150–400)
RBC: 4.49 MIL/uL (ref 3.87–5.11)
RDW: 12.7 % (ref 11.5–15.5)
WBC: 9.8 10*3/uL (ref 4.0–10.5)
nRBC: 0 % (ref 0.0–0.2)

## 2022-07-28 LAB — COMPREHENSIVE METABOLIC PANEL
ALT: 12 U/L (ref 0–44)
AST: 14 U/L — ABNORMAL LOW (ref 15–41)
Albumin: 4 g/dL (ref 3.5–5.0)
Alkaline Phosphatase: 53 U/L (ref 38–126)
Anion gap: 7 (ref 5–15)
BUN: 13 mg/dL (ref 6–20)
CO2: 23 mmol/L (ref 22–32)
Calcium: 8.9 mg/dL (ref 8.9–10.3)
Chloride: 105 mmol/L (ref 98–111)
Creatinine, Ser: 0.73 mg/dL (ref 0.44–1.00)
GFR, Estimated: >60 mL/min (ref >60)
Glucose, Bld: 92 mg/dL (ref 70–99)
Potassium: 3.5 mmol/L (ref 3.5–5.1)
Sodium: 135 mmol/L (ref 135–145)
Total Bilirubin: 0.7 mg/dL (ref 0.3–1.2)
Total Protein: 7.7 g/dL (ref 6.5–8.1)
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
## 2022-07-28 NOTE — Progress Notes (Signed)
Itchy sensation, pain, red with bumps right breast x2 weeks

## 2022-07-28 NOTE — Assessment & Plan Note (Addendum)
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 NO unusual vaginal bleeding [Uterus intact]  # Hot flashes- G-1- monitor for now.   #I also reviewed cancer screening: a] colonoscopy at 43 years b] annual PAP smear-   # DISPOSITION: # follow up in 6 months- MD: labs- cbc/cmp- Dr.B

## 2022-07-29 DIAGNOSIS — M778 Other enthesopathies, not elsewhere classified: Secondary | ICD-10-CM | POA: Diagnosis not present

## 2022-07-29 DIAGNOSIS — M7541 Impingement syndrome of right shoulder: Secondary | ICD-10-CM | POA: Diagnosis not present

## 2022-07-29 DIAGNOSIS — M25511 Pain in right shoulder: Secondary | ICD-10-CM | POA: Diagnosis not present

## 2022-07-29 DIAGNOSIS — M62838 Other muscle spasm: Secondary | ICD-10-CM | POA: Diagnosis not present

## 2022-08-12 ENCOUNTER — Encounter: Payer: Self-pay | Admitting: Emergency Medicine

## 2022-08-12 ENCOUNTER — Emergency Department: Payer: Medicaid Other

## 2022-08-12 ENCOUNTER — Other Ambulatory Visit: Payer: Self-pay

## 2022-08-12 ENCOUNTER — Emergency Department
Admission: EM | Admit: 2022-08-12 | Discharge: 2022-08-12 | Disposition: A | Discharge status: Home / Self Care | Payer: Medicaid Other | Attending: Emergency Medicine | Admitting: Emergency Medicine

## 2022-08-12 DIAGNOSIS — G8929 Other chronic pain: Secondary | ICD-10-CM | POA: Diagnosis not present

## 2022-08-12 DIAGNOSIS — R200 Anesthesia of skin: Secondary | ICD-10-CM | POA: Diagnosis not present

## 2022-08-12 DIAGNOSIS — M5001 Cervical disc disorder with myelopathy,  high cervical region: Secondary | ICD-10-CM | POA: Diagnosis not present

## 2022-08-12 DIAGNOSIS — M4802 Spinal stenosis, cervical region: Secondary | ICD-10-CM | POA: Diagnosis not present

## 2022-08-12 DIAGNOSIS — M40209 Unspecified kyphosis, site unspecified: Secondary | ICD-10-CM | POA: Diagnosis not present

## 2022-08-12 DIAGNOSIS — M79601 Pain in right arm: Secondary | ICD-10-CM | POA: Diagnosis present

## 2022-08-12 DIAGNOSIS — R202 Paresthesia of skin: Secondary | ICD-10-CM | POA: Diagnosis not present

## 2022-08-12 DIAGNOSIS — M7581 Other shoulder lesions, right shoulder: Secondary | ICD-10-CM | POA: Diagnosis not present

## 2022-08-12 DIAGNOSIS — M5412 Radiculopathy, cervical region: Secondary | ICD-10-CM | POA: Diagnosis not present

## 2022-08-12 DIAGNOSIS — M25511 Pain in right shoulder: Secondary | ICD-10-CM | POA: Diagnosis not present

## 2022-08-12 DIAGNOSIS — M948X1 Other specified disorders of cartilage, shoulder: Secondary | ICD-10-CM | POA: Diagnosis not present

## 2022-08-12 MED ORDER — DICLOFENAC SODIUM 1 % EX GEL: 2.0000 g | Freq: Four times a day (QID) | CUTANEOUS | 0 refills | Status: DC

## 2022-08-12 MED ORDER — GABAPENTIN 100 MG PO CAPS: ORAL_CAPSULE | ORAL | 0 refills | Status: AC

## 2022-08-12 MED ORDER — OXYCODONE-ACETAMINOPHEN 5-325 MG PO TABS: 1.0000 | ORAL_TABLET | Freq: Four times a day (QID) | ORAL | 0 refills | Status: AC | PRN

## 2022-08-12 NOTE — ED Provider Notes (Signed)
[facility] Provider Note    Event Date/Time   First MD Initiated Contact with Patient 08/12/22 1025     (approximate)   History   Chief Complaint: Shoulder Pain and Numbness   HPI  Julia Cherry is a 32 y.o. female with a past history of anxiety, hypertension, and right shoulder pain for the past few months comes to the ED due to ongoing pain and paresthesia in the right arm radiating from the shoulder down to the hand.  Has intact motor function but it is painful to grip things tightly.  No headache vision change or loss of balance and coordination.  No trauma.  No fever.     Physical Exam   Triage Vital Signs: ED Triage Vitals  Encounter Vitals Group     BP 08/12/22 1020 134/81     Systolic BP Percentile --      Diastolic BP Percentile --      Pulse Rate 08/12/22 1020 96     Resp 08/12/22 1020 18     Temp 08/12/22 1020 98.2 F (36.8 C)     Temp Source 08/12/22 1020 Oral     SpO2 08/12/22 1020 97 %     Weight 08/12/22 1033 158 lb 11.7 oz (72 kg)     Height 08/12/22 1033 5\' 2"  (1.575 m)     Head Circumference --      Peak Flow --      Pain Score 08/12/22 1020 7     Pain Loc --      Pain Education --      Exclude from Growth Chart --     Most recent vital signs: Vitals:   08/12/22 1020 08/12/22 1035  BP: 134/81 117/75  Pulse: 96 95  Resp: 18 16  Temp: 98.2 F (36.8 C)   SpO2: 97% 100%    General: Awake, no distress.  CV:  Good peripheral perfusion.  Normal distal pulses Resp:  Normal effort.  Clear to auscultation bilaterally Abd:  No distention.  Other:  Cranial nerves II through XII intact.  Intact range of motion.  No inflammatory soft tissue changes.  No bony point tenderness.  No midline spinal tenderness.  Pain is recreated with axial load test to the left of the cervical spine.  There is some tenderness in the soft tissue about the right shoulder joint.   ED Results / Procedures / Treatments   Labs (all labs  ordered are listed, but only abnormal results are displayed) Labs Reviewed - No data to display   EKG    RADIOLOGY MRI cervical spine interpreted by me, no mass or inflammatory change.  Radiology report reviewed.  MRI right shoulder reviewed by me.   PROCEDURES:  Procedures   MEDICATIONS ORDERED IN ED: Medications - No data to display   IMPRESSION / MDM / ASSESSMENT AND PLAN / ED COURSE  I reviewed the triage vital signs and the nursing notes.  DDx: Cervical radiculopathy, spinal compression, herniated cervical disc, brachial plexus syndrome  Patient's presentation is most consistent with severe exacerbation of chronic illness.  Patient presents with severe right shoulder pain, which has been ongoing for several months.  She has seen orthopedics who is recommended a 6-week course of physical therapy which has not started yet.  MRI obtained which does not show any serious findings.  Stable to continue outpatient management.  Will provide prescription for gabapentin for cervical radiculopathy, short course of Percocet to be used only at  night for sleep.  Could try Voltaren gel.       FINAL CLINICAL IMPRESSION(S) / ED DIAGNOSES   Final diagnoses:  Cervical radiculopathy     Rx / DC Orders   ED Discharge Orders          Ordered    gabapentin (NEURONTIN) 100 MG capsule        08/12/22 1346    oxyCODONE-acetaminophen (PERCOCET) 5-325 MG tablet  Every 6 hours PRN        08/12/22 1346             Note:  This document was prepared using Dragon voice recognition software and may include unintentional dictation errors.   Sharman Cheek, MD 08/12/22 1351

## 2022-08-12 NOTE — ED Triage Notes (Signed)
Pt here with right shoulder pain and numbness. Pt states she went to ortho last week with the same complaint. Pt states now she has a burning sensation radiating down to her hand. Pt states she needs a MRI. Pt ambulatory to triage.

## 2022-08-12 NOTE — Discharge Instructions (Addendum)
Results for orders placed or performed in visit on 07/28/22  Comprehensive metabolic panel  Result Value Ref Range   Sodium 135 135 - 145 mmol/L   Potassium 3.5 3.5 - 5.1 mmol/L   Chloride 105 98 - 111 mmol/L   CO2 23 22 - 32 mmol/L   Glucose, Bld 92 70 - 99 mg/dL   BUN 13 6 - 20 mg/dL   Creatinine, Ser 8.29 0.44 - 1.00 mg/dL   Calcium 8.9 8.9 - 56.2 mg/dL   Total Protein 7.7 6.5 - 8.1 g/dL   Albumin 4.0 3.5 - 5.0 g/dL   AST 14 (L) 15 - 41 U/L   ALT 12 0 - 44 U/L   Alkaline Phosphatase 53 38 - 126 U/L   Total Bilirubin 0.7 0.3 - 1.2 mg/dL   GFR, Estimated >13 >08 mL/min   Anion gap 7 5 - 15  CBC with Differential/Platelet  Result Value Ref Range   WBC 9.8 4.0 - 10.5 K/uL   RBC 4.49 3.87 - 5.11 MIL/uL   Hemoglobin 13.0 12.0 - 15.0 g/dL   HCT 65.7 84.6 - 96.2 %   MCV 86.0 80.0 - 100.0 fL   MCH 29.0 26.0 - 34.0 pg   MCHC 33.7 30.0 - 36.0 g/dL   RDW 95.2 84.1 - 32.4 %   Platelets 298 150 - 400 K/uL   nRBC 0.0 0.0 - 0.2 %   Neutrophils Relative % 64 %   Neutro Abs 6.2 1.7 - 7.7 K/uL   Lymphocytes Relative 27 %   Lymphs Abs 2.6 0.7 - 4.0 K/uL   Monocytes Relative 7 %   Monocytes Absolute 0.7 0.1 - 1.0 K/uL   Eosinophils Relative 2 %   Eosinophils Absolute 0.2 0.0 - 0.5 K/uL   Basophils Relative 0 %   Basophils Absolute 0.0 0.0 - 0.1 K/uL   Immature Granulocytes 0 %   Abs Immature Granulocytes 0.03 0.00 - 0.07 K/uL   MR SHOULDER RIGHT WO CONTRAST  Result Date: 08/12/2022 CLINICAL DATA:  Chronic right shoulder pain. Rotator cuff disorder suspected. EXAM: MRI OF THE RIGHT SHOULDER WITHOUT CONTRAST TECHNIQUE: Multiplanar, multisequence MR imaging of the shoulder was performed. No intravenous contrast was administered. COMPARISON:  None Available. FINDINGS: Rotator cuff: Minimal anterior supraspinatus intermediate T2 signal tendinosis (coronal series 6, image 16 and sagittal series 10, image 9). Mild anterior infraspinatus intermediate T2 signal tendinosis (sagittal series 10,  image 8 and coronal series 6 images 12 and 13). The subscapularis and teres minor are intact. Muscles: No rotator cuff muscle atrophy, fatty infiltration, or edema. Biceps long head: The intra-articular long head of the biceps tendon is intact. Acromioclavicular Joint: Normal alignment of the acromioclavicular joint without significant degenerative change. Type I acromion. Trace fluid within the subacromial/subdeltoid bursa. Glenohumeral Joint: Within the limitations of motion artifact, there appears to be mild glenohumeral cartilage thinning. Labrum: Grossly intact, but evaluation is limited by lack of intraarticular fluid. Bones:  No acute fracture. Other: None. IMPRESSION: 1. Minimal anterior supraspinatus and mild anterior infraspinatus tendinosis. No rotator cuff tear. 2. Mild glenohumeral cartilage thinning. Electronically Signed   By: Neita Garnet M.D.   On: 08/12/2022 13:10   MR Cervical Spine Wo Contrast  Result Date: 08/12/2022 CLINICAL DATA:  Myelopathy, acute, cervical spine. Right shoulder and neck pain and numbness. EXAM: MRI CERVICAL SPINE WITHOUT CONTRAST TECHNIQUE: Multiplanar, multisequence MR imaging of the cervical spine was performed. No intravenous contrast was administered. COMPARISON:  Radiography 05/20/2019 FINDINGS: Alignment: Straightening and mild kyphotic  curvature, possibly positional related to the scanned positioning. Vertebrae: No focal bone finding. Cord: No cord compression or focal cord lesion. Posterior Fossa, vertebral arteries, paraspinal tissues: Negative Disc levels: No abnormality from the foramen magnum through C2-3. C3-4: Mild bulging of the disc and uncovertebral prominence. No compressive stenosis. C4-5: Normal interspace. C5-6: Minimal uncovertebral prominence. No canal or foraminal stenosis. C6-7: Disc degeneration with a shallow protrusion in the right posterolateral direction. This narrows the ventral subarachnoid space but does not compress the cord. Mild  proximal foraminal encroachment on the right could possibly be associated with irritation of the right C7 nerve, though definite neural compression is not demonstrated. There is mild foraminal narrowing on the left as well. C7-T1: Normal interspace. IMPRESSION: C6-7: Disc degeneration with a shallow right posterolateral protrusion that narrows the ventral subarachnoid space but does not compress the cord. Mild proximal foraminal encroachment on the right could possibly affect the right C7 nerve, though definite neural compression is not demonstrated. There is mild foraminal narrowing on the left as well. Electronically Signed   By: Paulina Fusi M.D.   On: 08/12/2022 12:51

## 2022-08-15 DIAGNOSIS — S46011D Strain of muscle(s) and tendon(s) of the rotator cuff of right shoulder, subsequent encounter: Secondary | ICD-10-CM | POA: Diagnosis not present

## 2022-08-15 DIAGNOSIS — M5412 Radiculopathy, cervical region: Secondary | ICD-10-CM | POA: Diagnosis not present

## 2022-08-15 DIAGNOSIS — M25511 Pain in right shoulder: Secondary | ICD-10-CM | POA: Diagnosis not present

## 2022-08-22 DIAGNOSIS — M25511 Pain in right shoulder: Secondary | ICD-10-CM | POA: Diagnosis not present

## 2022-08-22 DIAGNOSIS — M5412 Radiculopathy, cervical region: Secondary | ICD-10-CM | POA: Diagnosis not present

## 2022-08-22 DIAGNOSIS — S46011D Strain of muscle(s) and tendon(s) of the rotator cuff of right shoulder, subsequent encounter: Secondary | ICD-10-CM | POA: Diagnosis not present

## 2022-08-25 ENCOUNTER — Encounter: Payer: Self-pay | Admitting: Nurse Practitioner

## 2022-08-28 DIAGNOSIS — S46011D Strain of muscle(s) and tendon(s) of the rotator cuff of right shoulder, subsequent encounter: Secondary | ICD-10-CM | POA: Diagnosis not present

## 2022-08-28 DIAGNOSIS — M5412 Radiculopathy, cervical region: Secondary | ICD-10-CM | POA: Diagnosis not present

## 2022-08-28 DIAGNOSIS — M25511 Pain in right shoulder: Secondary | ICD-10-CM | POA: Diagnosis not present

## 2022-09-02 ENCOUNTER — Other Ambulatory Visit: Payer: Self-pay | Admitting: Physical Medicine & Rehabilitation

## 2022-09-02 DIAGNOSIS — M25511 Pain in right shoulder: Secondary | ICD-10-CM | POA: Diagnosis not present

## 2022-09-02 DIAGNOSIS — M5412 Radiculopathy, cervical region: Secondary | ICD-10-CM

## 2022-09-02 DIAGNOSIS — G8929 Other chronic pain: Secondary | ICD-10-CM | POA: Diagnosis not present

## 2022-09-02 DIAGNOSIS — M542 Cervicalgia: Secondary | ICD-10-CM | POA: Diagnosis not present

## 2022-09-10 ENCOUNTER — Ambulatory Visit: Payer: Self-pay | Admitting: *Deleted

## 2022-09-10 ENCOUNTER — Emergency Department
Admission: EM | Admit: 2022-09-10 | Discharge: 2022-09-10 | Disposition: A | Discharge status: Home / Self Care | Payer: Medicaid Other | Attending: Emergency Medicine | Admitting: Emergency Medicine

## 2022-09-10 ENCOUNTER — Other Ambulatory Visit: Payer: Self-pay

## 2022-09-10 ENCOUNTER — Emergency Department: Payer: Medicaid Other

## 2022-09-10 DIAGNOSIS — J45909 Unspecified asthma, uncomplicated: Secondary | ICD-10-CM | POA: Diagnosis not present

## 2022-09-10 DIAGNOSIS — R0789 Other chest pain: Secondary | ICD-10-CM | POA: Diagnosis not present

## 2022-09-10 DIAGNOSIS — I10 Essential (primary) hypertension: Secondary | ICD-10-CM | POA: Insufficient documentation

## 2022-09-10 DIAGNOSIS — R079 Chest pain, unspecified: Secondary | ICD-10-CM | POA: Diagnosis not present

## 2022-09-10 DIAGNOSIS — E876 Hypokalemia: Secondary | ICD-10-CM | POA: Diagnosis not present

## 2022-09-10 LAB — D-DIMER, QUANTITATIVE: D-Dimer, Quant: 0.5 ug{FEU}/mL (ref 0.00–0.50)

## 2022-09-10 LAB — COMPREHENSIVE METABOLIC PANEL
ALT: 12 U/L (ref 0–44)
AST: 26 U/L (ref 15–41)
Albumin: 4 g/dL (ref 3.5–5.0)
Alkaline Phosphatase: 62 U/L (ref 38–126)
Anion gap: 12 (ref 5–15)
BUN: 12 mg/dL (ref 6–20)
CO2: 17 mmol/L — ABNORMAL LOW (ref 22–32)
Calcium: 9 mg/dL (ref 8.9–10.3)
Chloride: 106 mmol/L (ref 98–111)
Creatinine, Ser: 0.86 mg/dL (ref 0.44–1.00)
GFR, Estimated: >60 mL/min (ref >60)
Glucose, Bld: 128 mg/dL — ABNORMAL HIGH (ref 70–99)
Potassium: 2.7 mmol/L — CL (ref 3.5–5.1)
Sodium: 135 mmol/L (ref 135–145)
Total Bilirubin: 0.5 mg/dL (ref 0.3–1.2)
Total Protein: 8.1 g/dL (ref 6.5–8.1)

## 2022-09-10 LAB — TROPONIN I (HIGH SENSITIVITY): Troponin I (High Sensitivity): 3 ng/L (ref <18)

## 2022-09-10 LAB — CBC
HCT: 39.7 % (ref 36.0–46.0)
Hemoglobin: 13.6 g/dL (ref 12.0–15.0)
MCH: 29.1 pg (ref 26.0–34.0)
MCHC: 34.3 g/dL (ref 30.0–36.0)
MCV: 85 fL (ref 80.0–100.0)
Platelets: 338 10*3/uL (ref 150–400)
RBC: 4.67 MIL/uL (ref 3.87–5.11)
RDW: 12.8 % (ref 11.5–15.5)
WBC: 12.1 10*3/uL — ABNORMAL HIGH (ref 4.0–10.5)
nRBC: 0 % (ref 0.0–0.2)

## 2022-09-10 MED ORDER — SODIUM CHLORIDE 0.9 % IV BOLUS
1000.0000 mL | Freq: Once | INTRAVENOUS | Status: AC
  Administered 2022-09-10: 1000 mL via INTRAVENOUS

## 2022-09-10 MED ORDER — LORAZEPAM 2 MG/ML IJ SOLN
1.0000 mg | Freq: Once | INTRAMUSCULAR | Status: AC
  Administered 2022-09-10: 1 mg via INTRAVENOUS
  Filled 2022-09-10: qty 1

## 2022-09-10 MED ORDER — POTASSIUM CHLORIDE CRYS ER 20 MEQ PO TBCR: 20.0000 meq | EXTENDED_RELEASE_TABLET | Freq: Two times a day (BID) | ORAL | 0 refills | Status: AC

## 2022-09-10 MED ORDER — FENTANYL CITRATE PF 50 MCG/ML IJ SOSY
50.0000 ug | PREFILLED_SYRINGE | Freq: Once | INTRAMUSCULAR | Status: AC
  Administered 2022-09-10: 50 ug via INTRAVENOUS
  Filled 2022-09-10: qty 1

## 2022-09-10 NOTE — Telephone Encounter (Signed)
FYI

## 2022-09-10 NOTE — ED Provider Notes (Signed)
[facility] Provider Note    Event Date/Time   First MD Initiated Contact with Patient 09/10/22 1103     (approximate)  History   Chief Complaint: Chest Pain  HPI  Julia Cherry is a 32 y.o. female with a past medical history of anxiety, asthma, hypertension, presents to the emergency department for left-sided chest pain.  According to the patient she was chasing her puppy around last night and excellently tripped falling onto her left side.  Patient states she was not having much pain in the chest last night however early this morning she bent over and felt something "pull" in her chest and since then she has been experiencing chest pain worse with movement or touch or deep breathing.  Patient is very anxious appearing, does admit to history of anxiety and panic attacks but states this feels different.  No history of cardiac disease, does state a family history of CHF.  No history of DVT/PE, no estrogen therapy or birth control.  Physical Exam   Triage Vital Signs: ED Triage Vitals [09/10/22 1054]  Encounter Vitals Group     BP (!) 143/96     Systolic BP Percentile      Diastolic BP Percentile      Pulse Rate (!) 151     Resp 16     Temp      Temp src      SpO2 100 %     Weight      Height      Head Circumference      Peak Flow      Pain Score 10     Pain Loc      Pain Education      Exclude from Growth Chart     Most recent vital signs: Vitals:   09/10/22 1054  BP: (!) 143/96  Pulse: (!) 151  Resp: 16  SpO2: 100%    General: Awake, hyperventilating, appears quite anxious. CV:  Good peripheral perfusion.  Regular rate and rhythm Resp:  Normal effort.  Equal breath sounds bilaterally.  Significant left chest wall tenderness to palpation, very reproducible. Abd:  No distention.  Soft, nontender.  No rebound or guarding. Other:  No lower extremity edema   ED Results / Procedures / Treatments   EKG  EKG viewed and interpreted  by myself shows sinus tachycardia 139 bpm with a narrow QRS, normal axis, normal intervals besides QTc prolongation, nonspecific ST changes.  Electrical interference from the patient hyperventilating.  RADIOLOGY  I reviewed and interpreted the chest x-ray images.  No obvious consolidation on my evaluation. Radiologist read the x-ray is negative   MEDICATIONS ORDERED IN ED: Medications  LORazepam (ATIVAN) injection 1 mg (has no administration in time range)  sodium chloride 0.9 % bolus 1,000 mL (has no administration in time range)     IMPRESSION / MDM / ASSESSMENT AND PLAN / ED COURSE  I reviewed the triage vital signs and the nursing notes.  Patient's presentation is most consistent with acute presentation with potential threat to life or bodily function.  Patient presents to the emergency department for left-sided chest pain found to be tachycardic.  Chest discomfort is very reproducible with palpation however the patient is very anxious appearing, hyperventilating she is tachycardic around 140 bpm.  Does state a history of panic disorder.  Last menstrual period was 2 weeks ago we will dose 1 mg of Ativan.  We will check labs given the patient states it  is worse with breathing we will also obtain a D-dimer and addition to a CBC chemistry and troponin.  Will obtain a chest x-ray and continue to closely monitor.  Differential would include chest wall discomfort, contusion, fracture, pneumothorax, ACS or PE, anxiety/panic.  Patient's workup shows a reassuring CBC.  Chemistry does show mild hypokalemia we will discharge with potassium supplements for neck 7 days have the patient follow-up with her doctor for recheck of her potassium level.  Patient's troponin is negative, D-dimer is negative.  Chest x-ray is clear.  Patient is feeling much better after anxiety medication pulse rate has normalized currently 85 bpm.  Given the patient's reassuring workup we will discharge home on potassium have the  patient follow-up with her doctor.  FINAL CLINICAL IMPRESSION(S) / ED DIAGNOSES   Chest pain Hypokalemia  Note:  This document was prepared using Dragon voice recognition software and may include unintentional dictation errors.   Minna Antis, MD 09/10/22 1451

## 2022-09-10 NOTE — Telephone Encounter (Signed)
Reason for Disposition  SEVERE chest pain  Answer Assessment - Initial Assessment Questions 1. MECHANISM: "How did the injury happen?"     Fell while chasing dog yesterday 2. ONSET: "When did the injury happen?" (Minutes or hours ago)     Yesterday- late evening 3. LOCATION: "Where on the chest is the injury located?"     Left side- chest/shoulder 4. APPEARANCE: "What does the injury look like?"     no 5. BLEEDING: "Is there any bleeding now? If Yes, ask: How long has it been bleeding?"     no 6. SEVERITY: "Any difficulty with breathing?"     Pain with breathing- feels like lung being squeezed on left 7. SIZE: For cuts, bruises, or swelling, ask: "How large is it?" (e.g., inches or centimeters)     *No Answer* 8. PAIN: "Is there pain?" If Yes, ask: "How bad is the pain?"   (e.g., Scale 1-10; or mild, moderate, severe)     severe  Protocols used: Chest Injury-A-AH

## 2022-09-10 NOTE — Discharge Instructions (Signed)
Please take your potassium supplements as prescribed twice a day for the next 7 days.  Follow-up with your doctor after that to have your lab work rechecked.  Return to the emergency department for any return of/worsening chest pain or any other symptom personally concerning to yourself.

## 2022-09-10 NOTE — Telephone Encounter (Signed)
Patient's wife is calling- Anayah(DPR) Chief Complaint: chest injury Symptoms: patient fell last night- walking dog- fell on left side, left sided pain and tightness with breathing  Frequency: late evening  Disposition: [x] ED /[] Urgent Care (no appt availability in office) / [] Appointment(In office/virtual)/ []  [facility]/ [] Home Care/ [] Refused Recommended Disposition /[]  Mobile Bus/ []  Follow-up with PCP Additional Notes: Advised ED per protocol

## 2022-09-10 NOTE — ED Triage Notes (Addendum)
Pt to ED via POV from home. Pt clenching her chest, hyperventilating and unable to tell this RN what brought her in. Pt able to control breathing and reports she fell ast night and started hurting in her left shoulder. Pt reports everytime she moves and breaths it clenches her chest.

## 2022-09-11 ENCOUNTER — Encounter: Payer: Self-pay | Admitting: Physical Medicine & Rehabilitation

## 2022-09-11 DIAGNOSIS — M25511 Pain in right shoulder: Secondary | ICD-10-CM | POA: Diagnosis not present

## 2022-09-11 DIAGNOSIS — M5412 Radiculopathy, cervical region: Secondary | ICD-10-CM | POA: Diagnosis not present

## 2022-09-11 DIAGNOSIS — S46011D Strain of muscle(s) and tendon(s) of the rotator cuff of right shoulder, subsequent encounter: Secondary | ICD-10-CM | POA: Diagnosis not present

## 2022-09-12 DIAGNOSIS — M542 Cervicalgia: Secondary | ICD-10-CM | POA: Diagnosis not present

## 2022-09-12 DIAGNOSIS — M25511 Pain in right shoulder: Secondary | ICD-10-CM | POA: Diagnosis not present

## 2022-09-12 DIAGNOSIS — G8929 Other chronic pain: Secondary | ICD-10-CM | POA: Diagnosis not present

## 2022-09-12 DIAGNOSIS — M5441 Lumbago with sciatica, right side: Secondary | ICD-10-CM | POA: Diagnosis not present

## 2022-09-12 DIAGNOSIS — M5412 Radiculopathy, cervical region: Secondary | ICD-10-CM | POA: Diagnosis not present

## 2022-09-12 DIAGNOSIS — M25512 Pain in left shoulder: Secondary | ICD-10-CM | POA: Diagnosis not present

## 2022-09-17 NOTE — Discharge Instructions (Signed)

## 2022-09-18 ENCOUNTER — Ambulatory Visit
Admission: RE | Admit: 2022-09-18 | Discharge: 2022-09-18 | Disposition: A | Discharge status: Home / Self Care | Payer: BC Managed Care – PPO | Source: Ambulatory Visit | Attending: Physical Medicine & Rehabilitation | Admitting: Physical Medicine & Rehabilitation

## 2022-09-18 ENCOUNTER — Other Ambulatory Visit: Payer: Self-pay | Admitting: Physical Medicine & Rehabilitation

## 2022-09-18 DIAGNOSIS — M50123 Cervical disc disorder at C6-C7 level with radiculopathy: Secondary | ICD-10-CM | POA: Diagnosis not present

## 2022-09-18 DIAGNOSIS — M5412 Radiculopathy, cervical region: Secondary | ICD-10-CM

## 2022-09-18 DIAGNOSIS — M5441 Lumbago with sciatica, right side: Secondary | ICD-10-CM

## 2022-09-18 MED ORDER — TRIAMCINOLONE ACETONIDE 40 MG/ML IJ SUSP (RADIOLOGY)
60.0000 mg | Freq: Once | INTRAMUSCULAR | Status: AC
  Administered 2022-09-18: 60 mg via EPIDURAL

## 2022-09-18 MED ORDER — IOPAMIDOL (ISOVUE-M 300) INJECTION 61%
1.0000 mL | Freq: Once | INTRAMUSCULAR | Status: AC | PRN
  Administered 2022-09-18: 1 mL via EPIDURAL

## 2022-09-23 ENCOUNTER — Emergency Department
Admission: EM | Admit: 2022-09-23 | Discharge: 2022-09-23 | Disposition: A | Discharge status: Home / Self Care | Payer: BC Managed Care – PPO | Attending: Emergency Medicine | Admitting: Emergency Medicine

## 2022-09-23 ENCOUNTER — Other Ambulatory Visit: Payer: Self-pay

## 2022-09-23 ENCOUNTER — Encounter: Payer: Self-pay | Admitting: Nurse Practitioner

## 2022-09-23 ENCOUNTER — Emergency Department: Payer: BC Managed Care – PPO

## 2022-09-23 ENCOUNTER — Encounter: Payer: Self-pay | Admitting: Emergency Medicine

## 2022-09-23 DIAGNOSIS — M50323 Other cervical disc degeneration at C6-C7 level: Secondary | ICD-10-CM | POA: Diagnosis not present

## 2022-09-23 DIAGNOSIS — M62838 Other muscle spasm: Secondary | ICD-10-CM | POA: Diagnosis not present

## 2022-09-23 DIAGNOSIS — I10 Essential (primary) hypertension: Secondary | ICD-10-CM | POA: Diagnosis not present

## 2022-09-23 DIAGNOSIS — M5410 Radiculopathy, site unspecified: Secondary | ICD-10-CM

## 2022-09-23 DIAGNOSIS — R519 Headache, unspecified: Secondary | ICD-10-CM | POA: Insufficient documentation

## 2022-09-23 DIAGNOSIS — M67911 Unspecified disorder of synovium and tendon, right shoulder: Secondary | ICD-10-CM | POA: Diagnosis not present

## 2022-09-23 DIAGNOSIS — M542 Cervicalgia: Secondary | ICD-10-CM | POA: Diagnosis not present

## 2022-09-23 DIAGNOSIS — M5412 Radiculopathy, cervical region: Secondary | ICD-10-CM | POA: Diagnosis not present

## 2022-09-23 DIAGNOSIS — H532 Diplopia: Secondary | ICD-10-CM | POA: Diagnosis not present

## 2022-09-23 LAB — COMPREHENSIVE METABOLIC PANEL
ALT: 13 U/L (ref 0–44)
AST: 12 U/L — ABNORMAL LOW (ref 15–41)
Albumin: 3.7 g/dL (ref 3.5–5.0)
Alkaline Phosphatase: 49 U/L (ref 38–126)
Anion gap: 7 (ref 5–15)
BUN: 10 mg/dL (ref 6–20)
CO2: 24 mmol/L (ref 22–32)
Calcium: 8.9 mg/dL (ref 8.9–10.3)
Chloride: 104 mmol/L (ref 98–111)
Creatinine, Ser: 0.59 mg/dL (ref 0.44–1.00)
GFR, Estimated: >60 mL/min (ref >60)
Glucose, Bld: 101 mg/dL — ABNORMAL HIGH (ref 70–99)
Potassium: 3.2 mmol/L — ABNORMAL LOW (ref 3.5–5.1)
Sodium: 135 mmol/L (ref 135–145)
Total Bilirubin: 0.7 mg/dL (ref 0.3–1.2)
Total Protein: 7.1 g/dL (ref 6.5–8.1)

## 2022-09-23 LAB — CBC
HCT: 39.2 % (ref 36.0–46.0)
Hemoglobin: 13.2 g/dL (ref 12.0–15.0)
MCH: 28.4 pg (ref 26.0–34.0)
MCHC: 33.7 g/dL (ref 30.0–36.0)
MCV: 84.5 fL (ref 80.0–100.0)
Platelets: 350 10*3/uL (ref 150–400)
RBC: 4.64 MIL/uL (ref 3.87–5.11)
RDW: 13 % (ref 11.5–15.5)
WBC: 15.9 10*3/uL — ABNORMAL HIGH (ref 4.0–10.5)
nRBC: 0 % (ref 0.0–0.2)

## 2022-09-23 LAB — GROUP A STREP BY PCR: Group A Strep by PCR: NOT DETECTED

## 2022-09-23 NOTE — Discharge Instructions (Addendum)
Take acetaminophen 650 mg and ibuprofen 400 mg every 6 hours for pain.  Take with food. Follow-up with your spinal specialist as scheduled.  Thank you for choosing [place] for your health care today!  Please see your primary doctor this week for a follow up appointment.   If you have any new, worsening, or unexpected symptoms call your doctor right away or come back to the emergency department for reevaluation.  It was my pleasure to care for you today.   Daneil Dan Modesto Charon, MD

## 2022-09-23 NOTE — ED Notes (Signed)
See triage note  Presents with pain to neck  States she had a steroid shot last Friday  States now it feels like her head is going to explode  Low grade temp on arrival

## 2022-09-23 NOTE — ED Provider Notes (Signed)
[facility] [place] Provider Note    Event Date/Time   First MD Initiated Contact with Patient 09/23/22 1335     (approximate)   History   Neck Pain   HPI  Julia Cherry is a 32 y.o. female   Past medical history of standing history of cervicalgia and radiculopathy, undergoing rehab, and seeing orthopedic specialist who presents emergency department with ongoing neck pain, headache, after receiving a steroid injection in her neck last week.  4 days of symptoms, pain at the base of her neck at the injection site as well as a squeezing type headache.  She states that when the pain is bad and she closes her eyes and opens them she sees intermittent spotting in her vision that quickly resolves.   Otherwise her radiculopathy pain has been unchanged, she reports no new motor or sensory changes and she denies any fevers or chills or rashes at the site of injection.  Independent Historian contributed to assessment above: Her partner is at bedside to corroborate information as above  External Medical Documents Reviewed: Emergency department visit approximately 1 month ago for right-sided neck pain radiation down the right side of her body including her arm got an MRI at that time which looked normal and was prescribed gabapentin for cervical radiculopathy.      Physical Exam   Triage Vital Signs: ED Triage Vitals [09/23/22 1200]  Encounter Vitals Group     BP (!) 153/94     Systolic BP Percentile      Diastolic BP Percentile      Pulse Rate 94     Resp 17     Temp 99.4 F (37.4 C)     Temp Source Oral     SpO2 98 %     Weight 158 lb 11.7 oz (72 kg)     Height 5\' 2"  (1.575 m)     Head Circumference      Peak Flow      Pain Score 9     Pain Loc      Pain Education      Exclude from Growth Chart     Most recent vital signs: Vitals:   09/23/22 1200  BP: (!) 153/94  Pulse: 94  Resp: 17  Temp: 99.4 F (37.4 C)  SpO2: 98%    General: Awake, no  distress.  CV:  Good peripheral perfusion.  Resp:  Normal effort.  Abd:  No distention.  Other:  Awake alert comfortable appearing no acute distress.  Hypertensive otherwise vital signs within normal limits, afebrile.  Neck is supple with full range of motion and the site of the injection has no signs of infection.  Motor sensor exam is normal.  Visual acuity is intact.  There is no midline tenderness.   ED Results / Procedures / Treatments   Labs (all labs ordered are listed, but only abnormal results are displayed) Labs Reviewed  CBC - Abnormal; Notable for the following components:      Result Value   WBC 15.9 (*)    All other components within normal limits  COMPREHENSIVE METABOLIC PANEL - Abnormal; Notable for the following components:   Potassium 3.2 (*)    Glucose, Bld 101 (*)    AST 12 (*)    All other components within normal limits  GROUP A STREP BY PCR     I ordered and reviewed the above labs they are notable for a white blood cell count is 15.9, compared  to 12.1 in August.    PROCEDURES:  Critical Care performed: No  Procedures   MEDICATIONS ORDERED IN ED: Medications - No data to display  IMPRESSION / MDM / ASSESSMENT AND PLAN / ED COURSE  I reviewed the triage vital signs and the nursing notes.                                Patient's presentation is most consistent with acute presentation with potential threat to life or bodily function.  Differential diagnosis includes, but is not limited to, cervicalgia, musculoskeletal pain, tension type headache, dissection, infection   The patient is on the cardiac monitor to evaluate for evidence of arrhythmia and/or significant heart rate changes.  MDM:    This is a patient with longstanding cervicalgia with pain in the injection site and ongoing neck pain since her injection about 4 days ago.  She has full range of motion of the neck no midline tenderness I doubt meningitis or emergent infection, no signs  of skin infection as well.  I think most likely musculoskeletal pain, perhaps exacerbated by the inflammatory response to a shot, I also considered more emergent diagnosis of a dissection though I think less likely  -offered CT angiogram to further assess but patient feels well now and would not like to stay for this exam.  She understands to return with any worsening she will follow-up with her spine specialist.       FINAL CLINICAL IMPRESSION(S) / ED DIAGNOSES   Final diagnoses:  Neck pain  Radiculopathy, unspecified spinal region     Rx / DC Orders   ED Discharge Orders     None        Note:  This document was prepared using Dragon voice recognition software and may include unintentional dictation errors.    Pilar Jarvis, MD 09/23/22 [phone]

## 2022-09-23 NOTE — ED Triage Notes (Addendum)
Pt here neck and head pain. Pt received a steroid shot Friday for her current neck issues. Pt states the pain is in her neck down to her legs. Pt states she feels flush. Pt states she started seeing little dots last night and is now seeing splotches. Pt ambulatory to triage.

## 2022-09-25 ENCOUNTER — Ambulatory Visit: Payer: BC Managed Care – PPO | Admitting: Nurse Practitioner

## 2022-09-25 NOTE — Progress Notes (Deleted)
There were no vitals taken for this visit.   Subjective:    Patient ID: [name], female    DOB: 08/09/1990, 32 y.o.   MRN: [id number]  HPI: Julia Cherry is a 32 y.o. female  No chief complaint on file.  Elevated blood pressure readings:  -Medications: not currently on blood pressure medication -Checking BP at home (average): *** -Highest BP at home: *** -Lowest BP at home: *** -Denies any SOB, CP, vision changes, LE edema or symptoms of hypotension -Diet: recommend DASH diet  -Exercise: recommend 150 min of physical activity weekly     Relevant past medical, surgical, family and social history reviewed and updated as indicated. Interim medical history since our last visit reviewed. Allergies and medications reviewed and updated.  Review of Systems  Constitutional: Negative for fever or weight change.  Respiratory: Negative for cough and shortness of breath.   Cardiovascular: Negative for chest pain or palpitations.  Gastrointestinal: Negative for abdominal pain, no bowel changes.  Musculoskeletal: Negative for gait problem or joint swelling.  Skin: Negative for rash.  Neurological: Negative for dizziness or headache.  No other specific complaints in a complete review of systems (except as listed in HPI above).      Objective:    There were no vitals taken for this visit.  Wt Readings from Last 3 Encounters:  09/23/22 158 lb 11.7 oz (72 kg)  08/12/22 158 lb 11.7 oz (72 kg)  07/28/22 158 lb 3.2 oz (71.8 kg)    Physical Exam  Constitutional: Patient appears well-developed and well-nourished. Obese *** No distress.  HEENT: head atraumatic, normocephalic, pupils equal and reactive to light, ears ***, neck supple, throat within normal limits Cardiovascular: Normal rate, regular rhythm and normal heart sounds.  No murmur heard. No BLE edema. Pulmonary/Chest: Effort normal and breath sounds normal. No respiratory distress. Abdominal: Soft.  There  is no tenderness. Psychiatric: Patient has a normal mood and affect. behavior is normal. Judgment and thought content normal.   Results for orders placed or performed during the hospital encounter of 09/23/22  Group A Strep by PCR ([facility] Only)   Specimen: Throat; Sterile Swab  Result Value Ref Range   Group A Strep by PCR NOT DETECTED NOT DETECTED  CBC  Result Value Ref Range   WBC 15.9 (H) 4.0 - 10.5 K/uL   RBC 4.64 3.87 - 5.11 MIL/uL   Hemoglobin 13.2 12.0 - 15.0 g/dL   HCT 59.5 63.8 - 75.6 %   MCV 84.5 80.0 - 100.0 fL   MCH 28.4 26.0 - 34.0 pg   MCHC 33.7 30.0 - 36.0 g/dL   RDW 43.3 29.5 - 18.8 %   Platelets 350 150 - 400 K/uL   nRBC 0.0 0.0 - 0.2 %  Comprehensive metabolic panel  Result Value Ref Range   Sodium 135 135 - 145 mmol/L   Potassium 3.2 (L) 3.5 - 5.1 mmol/L   Chloride 104 98 - 111 mmol/L   CO2 24 22 - 32 mmol/L   Glucose, Bld 101 (H) 70 - 99 mg/dL   BUN 10 6 - 20 mg/dL   Creatinine, Ser 4.16 0.44 - 1.00 mg/dL   Calcium 8.9 8.9 - 60.6 mg/dL   Total Protein 7.1 6.5 - 8.1 g/dL   Albumin 3.7 3.5 - 5.0 g/dL   AST 12 (L) 15 - 41 U/L   ALT 13 0 - 44 U/L   Alkaline Phosphatase 49 38 - 126 U/L   Total Bilirubin 0.7  0.3 - 1.2 mg/dL   GFR, Estimated >09 >60 mL/min   Anion gap 7 5 - 15      Assessment & Plan:   Problem List Items Addressed This Visit   None    Follow up plan: No follow-ups on file.

## 2022-10-23 ENCOUNTER — Encounter: Payer: Self-pay | Admitting: Obstetrics and Gynecology

## 2022-10-23 DIAGNOSIS — Z23 Encounter for immunization: Secondary | ICD-10-CM | POA: Diagnosis not present

## 2022-10-23 DIAGNOSIS — R87610 Atypical squamous cells of undetermined significance on cytologic smear of cervix (ASC-US): Secondary | ICD-10-CM | POA: Diagnosis not present

## 2022-10-23 DIAGNOSIS — Z01419 Encounter for gynecological examination (general) (routine) without abnormal findings: Secondary | ICD-10-CM | POA: Diagnosis not present

## 2022-10-23 DIAGNOSIS — Z113 Encounter for screening for infections with a predominantly sexual mode of transmission: Secondary | ICD-10-CM | POA: Diagnosis not present

## 2022-10-23 DIAGNOSIS — Z0001 Encounter for general adult medical examination with abnormal findings: Secondary | ICD-10-CM | POA: Diagnosis not present

## 2022-10-23 DIAGNOSIS — Z124 Encounter for screening for malignant neoplasm of cervix: Secondary | ICD-10-CM | POA: Diagnosis not present

## 2022-11-24 ENCOUNTER — Emergency Department
Admission: EM | Admit: 2022-11-24 | Discharge: 2022-11-24 | Disposition: A | Discharge status: Home / Self Care | Payer: Medicaid Other | Attending: Emergency Medicine | Admitting: Emergency Medicine

## 2022-11-24 ENCOUNTER — Other Ambulatory Visit: Payer: Self-pay

## 2022-11-24 ENCOUNTER — Ambulatory Visit: Payer: Self-pay | Admitting: *Deleted

## 2022-11-24 ENCOUNTER — Emergency Department: Payer: Medicaid Other

## 2022-11-24 ENCOUNTER — Telehealth: Payer: Medicaid Other | Admitting: Physician Assistant

## 2022-11-24 ENCOUNTER — Encounter: Payer: Self-pay | Admitting: Emergency Medicine

## 2022-11-24 DIAGNOSIS — Z20822 Contact with and (suspected) exposure to covid-19: Secondary | ICD-10-CM | POA: Diagnosis not present

## 2022-11-24 DIAGNOSIS — D72829 Elevated white blood cell count, unspecified: Secondary | ICD-10-CM | POA: Insufficient documentation

## 2022-11-24 DIAGNOSIS — R059 Cough, unspecified: Secondary | ICD-10-CM | POA: Diagnosis not present

## 2022-11-24 DIAGNOSIS — I10 Essential (primary) hypertension: Secondary | ICD-10-CM | POA: Insufficient documentation

## 2022-11-24 DIAGNOSIS — J189 Pneumonia, unspecified organism: Secondary | ICD-10-CM

## 2022-11-24 DIAGNOSIS — R509 Fever, unspecified: Secondary | ICD-10-CM | POA: Diagnosis not present

## 2022-11-24 DIAGNOSIS — J168 Pneumonia due to other specified infectious organisms: Secondary | ICD-10-CM | POA: Insufficient documentation

## 2022-11-24 DIAGNOSIS — R000 Tachycardia, unspecified: Secondary | ICD-10-CM | POA: Diagnosis not present

## 2022-11-24 LAB — BASIC METABOLIC PANEL
Anion gap: 9 (ref 5–15)
BUN: 8 mg/dL (ref 6–20)
CO2: 23 mmol/L (ref 22–32)
Calcium: 8.9 mg/dL (ref 8.9–10.3)
Chloride: 101 mmol/L (ref 98–111)
Creatinine, Ser: 0.85 mg/dL (ref 0.44–1.00)
GFR, Estimated: >60 mL/min (ref >60)
Glucose, Bld: 101 mg/dL — ABNORMAL HIGH (ref 70–99)
Potassium: 3.3 mmol/L — ABNORMAL LOW (ref 3.5–5.1)
Sodium: 133 mmol/L — ABNORMAL LOW (ref 135–145)

## 2022-11-24 LAB — URINALYSIS, ROUTINE W REFLEX MICROSCOPIC
Bilirubin Urine: NEGATIVE
Glucose, UA: NEGATIVE mg/dL
Hgb urine dipstick: NEGATIVE
Ketones, ur: NEGATIVE mg/dL
Nitrite: NEGATIVE
Protein, ur: NEGATIVE mg/dL
Specific Gravity, Urine: 1.009 (ref 1.005–1.030)
pH: 7 (ref 5.0–8.0)

## 2022-11-24 LAB — CBC WITH DIFFERENTIAL/PLATELET
Abs Immature Granulocytes: 0.05 10*3/uL (ref 0.00–0.07)
Basophils Absolute: 0 10*3/uL (ref 0.0–0.1)
Basophils Relative: 0 %
Eosinophils Absolute: 0 10*3/uL (ref 0.0–0.5)
Eosinophils Relative: 0 %
HCT: 41.1 % (ref 36.0–46.0)
Hemoglobin: 13.8 g/dL (ref 12.0–15.0)
Immature Granulocytes: 0 %
Lymphocytes Relative: 18 %
Lymphs Abs: 2.1 10*3/uL (ref 0.7–4.0)
MCH: 29.1 pg (ref 26.0–34.0)
MCHC: 33.6 g/dL (ref 30.0–36.0)
MCV: 86.7 fL (ref 80.0–100.0)
Monocytes Absolute: 1.4 10*3/uL — ABNORMAL HIGH (ref 0.1–1.0)
Monocytes Relative: 12 %
Neutro Abs: 8.2 10*3/uL — ABNORMAL HIGH (ref 1.7–7.7)
Neutrophils Relative %: 70 %
Platelets: 315 10*3/uL (ref 150–400)
RBC: 4.74 MIL/uL (ref 3.87–5.11)
RDW: 13.1 % (ref 11.5–15.5)
WBC: 11.7 10*3/uL — ABNORMAL HIGH (ref 4.0–10.5)
nRBC: 0 % (ref 0.0–0.2)

## 2022-11-24 LAB — HEPATIC FUNCTION PANEL
ALT: 11 U/L (ref 0–44)
AST: 15 U/L (ref 15–41)
Albumin: 4.2 g/dL (ref 3.5–5.0)
Alkaline Phosphatase: 51 U/L (ref 38–126)
Bilirubin, Direct: <0.1 mg/dL (ref 0.0–0.2)
Total Bilirubin: 0.9 mg/dL (ref <1.2)
Total Protein: 8.9 g/dL — ABNORMAL HIGH (ref 6.5–8.1)

## 2022-11-24 LAB — RESP PANEL BY RT-PCR (RSV, FLU A&B, COVID)  RVPGX2
Influenza A by PCR: NEGATIVE
Influenza B by PCR: NEGATIVE
Resp Syncytial Virus by PCR: NEGATIVE
SARS Coronavirus 2 by RT PCR: NEGATIVE

## 2022-11-24 LAB — LACTIC ACID, PLASMA: Lactic Acid, Venous: 0.9 mmol/L (ref 0.5–1.9)

## 2022-11-24 MED ORDER — LEVOFLOXACIN 750 MG PO TABS: 750.0000 mg | ORAL_TABLET | Freq: Every day | ORAL | 0 refills | Status: AC

## 2022-11-24 MED ORDER — DIPHENHYDRAMINE HCL 50 MG/ML IJ SOLN
25.0000 mg | Freq: Once | INTRAMUSCULAR | Status: AC
  Administered 2022-11-24: 25 mg via INTRAVENOUS
  Filled 2022-11-24: qty 1

## 2022-11-24 MED ORDER — LACTATED RINGERS IV BOLUS
1000.0000 mL | Freq: Once | INTRAVENOUS | Status: AC
  Administered 2022-11-24: 1000 mL via INTRAVENOUS

## 2022-11-24 MED ORDER — AZITHROMYCIN 500 MG IV SOLR
500.0000 mg | INTRAVENOUS | Status: DC
  Administered 2022-11-24: 500 mg via INTRAVENOUS
  Filled 2022-11-24: qty 5

## 2022-11-24 MED ORDER — ACETAMINOPHEN 500 MG PO TABS
1000.0000 mg | ORAL_TABLET | Freq: Once | ORAL | Status: AC
  Administered 2022-11-24: 1000 mg via ORAL
  Filled 2022-11-24: qty 2

## 2022-11-24 MED ORDER — CEFTRIAXONE SODIUM 2 G IJ SOLR
2.0000 g | INTRAMUSCULAR | Status: DC
  Administered 2022-11-24: 2 g via INTRAVENOUS
  Filled 2022-11-24: qty 20

## 2022-11-24 MED ORDER — IPRATROPIUM-ALBUTEROL 0.5-2.5 (3) MG/3ML IN SOLN
3.0000 mL | Freq: Once | RESPIRATORY_TRACT | Status: AC
  Administered 2022-11-24: 3 mL via RESPIRATORY_TRACT
  Filled 2022-11-24: qty 3

## 2022-11-24 NOTE — ED Notes (Signed)
See triage notes. Patient c/o cough times one week. Patient stated that she started having body aches, vomiting, fever and just not feeling well yesterday. Patient also states that when she finishes coughing, the right lung feels like someone is squeezing it

## 2022-11-24 NOTE — ED Notes (Signed)
Patient called out believing she is having an allergic reaction to one of the antibiotics. Patient was noted to have hives coming up on her face.

## 2022-11-24 NOTE — Sepsis Progress Note (Signed)
Elink monitoring for the code sepsis protocol.  

## 2022-11-24 NOTE — Progress Notes (Signed)
CODE SEPSIS - PHARMACY COMMUNICATION  **Broad Spectrum Antibiotics should be administered within 1 hour of Sepsis diagnosis**  Time Code Sepsis Called/Page Received: 11/11 @ 1248  Antibiotics Ordered: Azithromycin, ceftriaxone  Time of 1st antibiotic administration: 1314  Additional action taken by pharmacy: None  If necessary, Name of Provider/Nurse Contacted: None    Merryl Hacker ,PharmD Clinical Pharmacist  11/24/2022  1:24 PM

## 2022-11-24 NOTE — ED Notes (Signed)
Called [place] radiology per Dr. Arnoldo Morale on status of Chest xray. They will move it up the list to be read.

## 2022-11-24 NOTE — ED Provider Notes (Signed)
[facility] Provider Note    Event Date/Time   First MD Initiated Contact with Patient 11/24/22 1235     (approximate)   History   Chief Complaint Cough   HPI  Julia Cherry is a 32 y.o. female with past medical history of hypertension and anxiety who presents to the ED complaining of cough.  Patient reports that she has had a productive cough for the past week, began having fevers and chills starting last night.  She reports some sharp pain in the right side of her chest but denies any shortness of breath and has not had any pain or swelling in her legs.  She does report some nausea and vomiting, was unable to keep down a dose of Tylenol earlier today.  She has not had any diarrhea but denies any abdominal pain.  She has not had any dysuria or flank pain.     Physical Exam   Triage Vital Signs: ED Triage Vitals  Encounter Vitals Group     BP 11/24/22 1121 (!) 132/109     Systolic BP Percentile --      Diastolic BP Percentile --      Pulse Rate 11/24/22 1121 (!) 143     Resp 11/24/22 1121 18     Temp 11/24/22 1121 (!) 101 F (38.3 C)     Temp Source 11/24/22 1121 Oral     SpO2 11/24/22 1121 96 %     Weight 11/24/22 1122 160 lb (72.6 kg)     Height 11/24/22 1122 5\' 2"  (1.575 m)     Head Circumference --      Peak Flow --      Pain Score 11/24/22 1122 8     Pain Loc --      Pain Education --      Exclude from Growth Chart --     Most recent vital signs: Vitals:   11/24/22 1430 11/24/22 1500  BP: (!) 114/58 117/64  Pulse: (!) 115 80  Resp:  18  Temp:  98.4 F (36.9 C)  SpO2: 94% 94%    Constitutional: Alert and oriented. Eyes: Conjunctivae are normal. Head: Atraumatic. Nose: No congestion/rhinnorhea. Mouth/Throat: Mucous membranes are moist.  Cardiovascular: Tachycardic, regular rhythm. Grossly normal heart sounds.  2+ radial pulses bilaterally. Respiratory: Normal respiratory effort.  No retractions. Lungs  CTAB. Gastrointestinal: Soft and nontender. No distention. Musculoskeletal: No lower extremity tenderness nor edema.  Neurologic:  Normal speech and language. No gross focal neurologic deficits are appreciated.    ED Results / Procedures / Treatments   Labs (all labs ordered are listed, but only abnormal results are displayed) Labs Reviewed  CBC WITH DIFFERENTIAL/PLATELET - Abnormal; Notable for the following components:      Result Value   WBC 11.7 (*)    Neutro Abs 8.2 (*)    Monocytes Absolute 1.4 (*)    All other components within normal limits  BASIC METABOLIC PANEL - Abnormal; Notable for the following components:   Sodium 133 (*)    Potassium 3.3 (*)    Glucose, Bld 101 (*)    All other components within normal limits  URINALYSIS, ROUTINE W REFLEX MICROSCOPIC - Abnormal; Notable for the following components:   Color, Urine YELLOW (*)    APPearance CLOUDY (*)    Leukocytes,Ua SMALL (*)    Bacteria, UA RARE (*)    All other components within normal limits  HEPATIC FUNCTION PANEL - Abnormal; Notable for the following components:  Total Protein 8.9 (*)    All other components within normal limits  RESP PANEL BY RT-PCR (RSV, FLU A&B, COVID)  [id number]  CULTURE, BLOOD (ROUTINE X 2)  CULTURE, BLOOD (ROUTINE X 2)  LACTIC ACID, PLASMA     EKG  ED ECG REPORT I, Chesley Noon, the attending physician, personally viewed and interpreted this ECG.   Date: 11/24/2022  EKG Time: 14:38  Rate: 103  Rhythm: sinus tachycardia  Axis: Normal  Intervals:none  ST&T Change: T wave flattening inferiorly  RADIOLOGY Chest x-ray reviewed and interpreted by me concerning for left lower lobe infiltrate, no edema or effusion noted.  PROCEDURES:  Critical Care performed: No  Procedures   MEDICATIONS ORDERED IN ED: Medications  cefTRIAXone (ROCEPHIN) 2 g in sodium chloride 0.9 % 100 mL IVPB (0 g Intravenous Stopped 11/24/22 1353)  lactated ringers bolus 1,000 mL (1,000 mLs  Intravenous New Bag/Given 11/24/22 1315)  acetaminophen (TYLENOL) tablet 1,000 mg (1,000 mg Oral Given 11/24/22 1316)  diphenhydrAMINE (BENADRYL) injection 25 mg (25 mg Intravenous Given 11/24/22 1410)  ipratropium-albuterol (DUONEB) 0.5-2.5 (3) MG/3ML nebulizer solution 3 mL (3 mLs Nebulization Given 11/24/22 1411)     IMPRESSION / MDM / ASSESSMENT AND PLAN / ED COURSE  I reviewed the triage vital signs and the nursing notes.                              32 y.o. female with past medical history of hypertension and anxiety who presents to the ED complaining of 1 week of productive cough with fevers, chills, and chest discomfort starting last night.  Patient's presentation is most consistent with acute presentation with potential threat to life or bodily function.  Differential diagnosis includes, but is not limited to, sepsis, pneumonia, pneumothorax, ACS, PE, viral syndrome, COVID-19, influenza, electrolyte abnormality, AKI.  Patient well-appearing and in no acute distress, vital signs remarkable for fever and tachycardia but she is not in any respiratory distress and maintaining oxygen saturations on room air.  Presentation concerning for sepsis and viral testing is negative, we will draw blood cultures and lactic acid, start on IV antibiotics for suspected pneumonia.  Labs show mild leukocytosis with no significant anemia, electrolyte abnormality, or AKI.  Lactic acid within normal limits, chest x-ray and urinalysis are pending at this time.  Chest x-ray concerning for left lower lobe pneumonia by my read, radiology read pending at this time.  Urinalysis appears contaminated but low suspicion for UTI I.  Patient did develop hives while receiving azithromycin, little while after completing ceftriaxone.  No signs of anaphylaxis and hives improved following IV Benadryl.  Patient turned over to on, provider pending chest x-ray read and reassessment, if improving plan to treat suspected pneumonia  with Levaquin.   FINAL CLINICAL IMPRESSION(S) / ED DIAGNOSES   Final diagnoses:  Pneumonia due to infectious organism, unspecified laterality, unspecified part of lung     Rx / DC Orders   ED Discharge Orders          Ordered    levofloxacin (LEVAQUIN) 750 MG tablet  Daily        11/24/22 1505             Note:  This document was prepared using Dragon voice recognition software and may include unintentional dictation errors.   Chesley Noon, MD 11/24/22 [phone]

## 2022-11-24 NOTE — Telephone Encounter (Signed)
Reason for Disposition  [1] MILD or MODERATE vomiting AND [2] present > 48 hours (2 days) (Exception: Mild vomiting with associated diarrhea.)  Answer Assessment - Initial Assessment Questions 1. VOMITING SEVERITY: "How many times have you vomited in the past 24 hours?"     - MILD:  1 - 2 times/day    - MODERATE: 3 - 5 times/day, decreased oral intake without significant weight loss or symptoms of dehydration    - SEVERE: 6 or more times/day, vomits everything or nearly everything, with significant weight loss, symptoms of dehydration      Chills, body aches, headaches, coughing, vomiting, hands numb and my chest hurts.   Fever 102.4.    2. ONSET: "When did the vomiting begin?"      Yesterday  3. FLUIDS: "What fluids or food have you vomited up today?" "Have you been able to keep any fluids down?"     I started vomiting last night after I drank some water.    No diarrhea, body aches and my chest hurts.    I feel so awful.   I feel a tightness in my chest.    My daughter just got over having pneumonia.    She had the same symptoms  4. ABDOMEN PAIN: "Are your having any abdomen pain?" If Yes : "How bad is it and what does it feel like?" (e.g., crampy, dull, intermittent, constant)      No 5. DIARRHEA: "Is there any diarrhea?" If Yes, ask: "How many times today?"      No 6. CONTACTS: "Is there anyone else in the family with the same symptoms?"      Yes my daughter had these same symptoms. 7. CAUSE: "What do you think is causing your vomiting?"     Virus from my daughter pneumonia 8. HYDRATION STATUS: "Any signs of dehydration?" (e.g., dry mouth [not only dry lips], too weak to stand) "When did you last urinate?"     Sips some fluids.   Dizzy this morning 9. OTHER SYMPTOMS: "Do you have any other symptoms?" (e.g., fever, headache, vertigo, vomiting blood or coffee grounds, recent head injury)     See above 10. PREGNANCY: "Is there any chance you are pregnant?" "When was your last menstrual  period?"       Not asked  Protocols used: Vomiting-A-AH

## 2022-11-24 NOTE — ED Provider Notes (Signed)
Care assumed of patient from outgoing provider.  See their note for initial history, exam and plan.  Clinical Course as of 11/24/22 1711  Mon Nov 24, 2022  1504 Presents with fever with cough.  Cough x 1 week, 24 hours of fever.  Sepsis work up initiated and overall reassuring, vitals improving.  CXR with LLQ pneumonia, waiting final read.  Plan for discharging on Levaquin.  Received IV Rocephin and azithromycin, rash after azithromycin. [SM]    Clinical Course User Index [SM] Corena Herter, MD   No hypoxia.  Remained stable on room air.  States she is feeling better.  Called in Levaquin.  Given return precautions and states she will follow-up with her primary care provider.   Corena Herter, MD 11/24/22 1711

## 2022-11-24 NOTE — Progress Notes (Signed)
The patient no-showed for appointment despite this provider sending direct link with no response and waiting for at least 10 minutes from appointment time for patient to join. They will be marked as a NS for this appointment/time.   Maryon Kemnitz M Kenzie Flakes, PA-C    

## 2022-11-24 NOTE — ED Triage Notes (Addendum)
Patient to ED via POV for cough x1 week. Yesterday started having body aches, vomiting, fever and over all not feeling well. Took tylenol approx 1 hr ago. Fever of 102.4 at home. States daughter has pneumonia. Unable to take NSAIDs due to liver problems.

## 2022-11-24 NOTE — Telephone Encounter (Signed)
  Chief Complaint: Bad headache, vomiting, body aches, coughing,congestion, fever, chest tightness, hands intermittently numb. Symptoms: "I just feel awful".    My daughter just had these same symptoms.   She had pneumonia.    Frequency: Symptoms started yesterday.   Vomiting started last night.   Vomited twice. Pertinent Negatives: Patient denies diarrhea or abd pain. Disposition: [] ED /[] Urgent Care (no appt availability in office) / [] Appointment(In office/virtual)/ [x]  [place] [facility]/ [] Home Care/ [] Refused Recommended Disposition /[] [place] [facility]/ []  Follow-up with PCP Additional Notes: No appts available with any of the providers at [facility].   [name], PA-C and float provider Erin Mecum, PA-C out of office today.    Pt. Scheduled a MyChart virtual visit for today at 11:00.

## 2022-11-29 LAB — CULTURE, BLOOD (ROUTINE X 2)
Culture: NO GROWTH
Culture: NO GROWTH

## 2023-01-17 DIAGNOSIS — Z20822 Contact with and (suspected) exposure to covid-19: Secondary | ICD-10-CM | POA: Diagnosis not present

## 2023-01-17 DIAGNOSIS — J069 Acute upper respiratory infection, unspecified: Secondary | ICD-10-CM | POA: Diagnosis not present

## 2023-01-28 ENCOUNTER — Inpatient Hospital Stay: Payer: Medicaid Other | Admitting: Internal Medicine

## 2023-01-28 ENCOUNTER — Inpatient Hospital Stay: Payer: Medicaid Other

## 2023-02-03 ENCOUNTER — Inpatient Hospital Stay: Payer: Medicaid Other | Attending: Internal Medicine

## 2023-02-03 ENCOUNTER — Inpatient Hospital Stay: Payer: Medicaid Other | Admitting: Internal Medicine

## 2023-03-03 ENCOUNTER — Ambulatory Visit
Admission: EM | Admit: 2023-03-03 | Discharge: 2023-03-03 | Discharge status: Against Medical Advice | Payer: Medicaid Other

## 2023-03-03 DIAGNOSIS — R519 Headache, unspecified: Secondary | ICD-10-CM | POA: Diagnosis not present

## 2023-03-03 DIAGNOSIS — J069 Acute upper respiratory infection, unspecified: Secondary | ICD-10-CM | POA: Diagnosis not present

## 2023-04-28 DIAGNOSIS — Z20822 Contact with and (suspected) exposure to covid-19: Secondary | ICD-10-CM | POA: Diagnosis not present

## 2023-04-28 DIAGNOSIS — M791 Myalgia, unspecified site: Secondary | ICD-10-CM | POA: Diagnosis not present

## 2023-04-28 DIAGNOSIS — J101 Influenza due to other identified influenza virus with other respiratory manifestations: Secondary | ICD-10-CM | POA: Diagnosis not present

## 2023-08-25 DIAGNOSIS — Z419 Encounter for procedure for purposes other than remedying health state, unspecified: Secondary | ICD-10-CM | POA: Diagnosis not present

## 2023-09-14 DIAGNOSIS — Z20822 Contact with and (suspected) exposure to covid-19: Secondary | ICD-10-CM | POA: Diagnosis not present

## 2023-09-14 DIAGNOSIS — J069 Acute upper respiratory infection, unspecified: Secondary | ICD-10-CM | POA: Diagnosis not present

## 2023-09-25 DIAGNOSIS — Z419 Encounter for procedure for purposes other than remedying health state, unspecified: Secondary | ICD-10-CM | POA: Diagnosis not present

## 2023-10-25 DIAGNOSIS — Z419 Encounter for procedure for purposes other than remedying health state, unspecified: Secondary | ICD-10-CM | POA: Diagnosis not present

## 2023-12-25 DIAGNOSIS — Z419 Encounter for procedure for purposes other than remedying health state, unspecified: Secondary | ICD-10-CM | POA: Diagnosis not present
